# Patient Record
Sex: Female | Born: 1970 | Race: White | Hispanic: Yes | Marital: Married | Smoking: Never smoker
Health system: Southern US, Community
[De-identification: ages and names within clinical notes are randomized; demographics above are authoritative.]

## PROBLEM LIST (undated history)

## (undated) DIAGNOSIS — N915 Oligomenorrhea, unspecified: Secondary | ICD-10-CM

## (undated) DIAGNOSIS — K649 Unspecified hemorrhoids: Secondary | ICD-10-CM

## (undated) DIAGNOSIS — K5792 Diverticulitis of intestine, part unspecified, without perforation or abscess without bleeding: Secondary | ICD-10-CM

## (undated) DIAGNOSIS — Z6791 Unspecified blood type, Rh negative: Secondary | ICD-10-CM

## (undated) DIAGNOSIS — H269 Unspecified cataract: Secondary | ICD-10-CM

## (undated) DIAGNOSIS — I10 Essential (primary) hypertension: Secondary | ICD-10-CM

## (undated) DIAGNOSIS — G43119 Migraine with aura, intractable, without status migrainosus: Principal | ICD-10-CM

## (undated) DIAGNOSIS — F419 Anxiety disorder, unspecified: Secondary | ICD-10-CM

## (undated) DIAGNOSIS — K602 Anal fissure, unspecified: Secondary | ICD-10-CM

## (undated) DIAGNOSIS — E349 Endocrine disorder, unspecified: Secondary | ICD-10-CM

## (undated) HISTORY — DX: Diverticulitis of intestine, part unspecified, without perforation or abscess without bleeding: K57.92

## (undated) HISTORY — DX: Migraine with aura, intractable, without status migrainosus: G43.119

## (undated) HISTORY — DX: Anal fissure, unspecified: K60.2

## (undated) HISTORY — DX: Unspecified cataract: H26.9

## (undated) HISTORY — DX: Endocrine disorder, unspecified: E34.9

## (undated) HISTORY — DX: Anxiety disorder, unspecified: F41.9

## (undated) HISTORY — DX: Unspecified hemorrhoids: K64.9

## (undated) HISTORY — PX: DILATION AND CURETTAGE OF UTERUS: SHX78

## (undated) HISTORY — DX: Essential (primary) hypertension: I10

## (undated) HISTORY — DX: Oligomenorrhea, unspecified: N91.5

## (undated) HISTORY — DX: Unspecified blood type, rh negative: Z67.91

---

## 2006-07-16 ENCOUNTER — Ambulatory Visit (HOSPITAL_COMMUNITY): Admission: RE | Admit: 2006-07-16 | Discharge: 2006-07-16 | Payer: Self-pay | Admitting: *Deleted

## 2006-07-16 ENCOUNTER — Encounter (INDEPENDENT_AMBULATORY_CARE_PROVIDER_SITE_OTHER): Payer: Self-pay | Admitting: *Deleted

## 2006-12-09 ENCOUNTER — Ambulatory Visit (HOSPITAL_COMMUNITY): Admission: RE | Admit: 2006-12-09 | Discharge: 2006-12-09 | Payer: Self-pay | Admitting: *Deleted

## 2007-01-07 ENCOUNTER — Ambulatory Visit (HOSPITAL_BASED_OUTPATIENT_CLINIC_OR_DEPARTMENT_OTHER): Admission: RE | Admit: 2007-01-07 | Discharge: 2007-01-07 | Payer: Self-pay | Admitting: Obstetrics and Gynecology

## 2007-01-07 ENCOUNTER — Encounter: Payer: Self-pay | Admitting: Obstetrics and Gynecology

## 2007-01-07 HISTORY — PX: DILATION AND CURETTAGE OF UTERUS: SHX78

## 2008-07-18 ENCOUNTER — Other Ambulatory Visit: Admission: RE | Admit: 2008-07-18 | Discharge: 2008-07-18 | Payer: Self-pay | Admitting: Gynecology

## 2008-07-18 ENCOUNTER — Ambulatory Visit: Payer: Self-pay | Admitting: Gynecology

## 2008-07-18 ENCOUNTER — Encounter: Payer: Self-pay | Admitting: Gynecology

## 2008-07-31 ENCOUNTER — Ambulatory Visit: Payer: Self-pay | Admitting: Gynecology

## 2008-08-20 ENCOUNTER — Ambulatory Visit: Payer: Self-pay | Admitting: Gynecology

## 2008-09-10 ENCOUNTER — Ambulatory Visit: Payer: Self-pay | Admitting: Gynecology

## 2008-09-18 ENCOUNTER — Ambulatory Visit: Payer: Self-pay | Admitting: Gynecology

## 2008-09-24 ENCOUNTER — Ambulatory Visit: Payer: Self-pay | Admitting: Gynecology

## 2008-10-01 ENCOUNTER — Ambulatory Visit: Payer: Self-pay | Admitting: Gynecology

## 2010-06-22 DIAGNOSIS — E349 Endocrine disorder, unspecified: Secondary | ICD-10-CM

## 2010-06-22 HISTORY — DX: Endocrine disorder, unspecified: E34.9

## 2010-09-09 ENCOUNTER — Encounter (INDEPENDENT_AMBULATORY_CARE_PROVIDER_SITE_OTHER): Payer: Self-pay | Admitting: Gynecology

## 2010-09-09 DIAGNOSIS — N912 Amenorrhea, unspecified: Secondary | ICD-10-CM

## 2010-09-09 DIAGNOSIS — O200 Threatened abortion: Secondary | ICD-10-CM

## 2010-09-11 ENCOUNTER — Other Ambulatory Visit: Payer: Self-pay

## 2010-09-11 ENCOUNTER — Ambulatory Visit (INDEPENDENT_AMBULATORY_CARE_PROVIDER_SITE_OTHER): Payer: Self-pay | Admitting: Gynecology

## 2010-09-11 DIAGNOSIS — O30009 Twin pregnancy, unspecified number of placenta and unspecified number of amniotic sacs, unspecified trimester: Secondary | ICD-10-CM

## 2010-09-11 DIAGNOSIS — IMO0002 Reserved for concepts with insufficient information to code with codable children: Secondary | ICD-10-CM

## 2010-09-29 ENCOUNTER — Ambulatory Visit (INDEPENDENT_AMBULATORY_CARE_PROVIDER_SITE_OTHER): Payer: Self-pay | Admitting: Gynecology

## 2010-09-29 ENCOUNTER — Other Ambulatory Visit: Payer: Self-pay

## 2010-09-29 DIAGNOSIS — N912 Amenorrhea, unspecified: Secondary | ICD-10-CM

## 2010-09-29 DIAGNOSIS — O021 Missed abortion: Secondary | ICD-10-CM

## 2010-09-29 DIAGNOSIS — O9989 Other specified diseases and conditions complicating pregnancy, childbirth and the puerperium: Secondary | ICD-10-CM

## 2010-10-01 ENCOUNTER — Other Ambulatory Visit: Payer: Self-pay | Admitting: Gynecology

## 2010-10-01 ENCOUNTER — Ambulatory Visit (HOSPITAL_BASED_OUTPATIENT_CLINIC_OR_DEPARTMENT_OTHER)
Admission: RE | Admit: 2010-10-01 | Discharge: 2010-10-01 | Disposition: A | Discharge status: Home / Self Care | Payer: Medicaid Other | Source: Ambulatory Visit | Attending: Gynecology | Admitting: Gynecology

## 2010-10-01 DIAGNOSIS — Z01812 Encounter for preprocedural laboratory examination: Secondary | ICD-10-CM | POA: Insufficient documentation

## 2010-10-01 DIAGNOSIS — Z298 Encounter for other specified prophylactic measures: Secondary | ICD-10-CM | POA: Insufficient documentation

## 2010-10-01 DIAGNOSIS — O021 Missed abortion: Secondary | ICD-10-CM | POA: Insufficient documentation

## 2010-10-01 DIAGNOSIS — Z2989 Encounter for other specified prophylactic measures: Secondary | ICD-10-CM | POA: Insufficient documentation

## 2010-10-01 HISTORY — PX: DILATION AND CURETTAGE OF UTERUS: SHX78

## 2010-10-01 LAB — POCT I-STAT 4, (NA,K, GLUC, HGB,HCT)
HCT: 35 % — ABNORMAL LOW (ref 36.0–46.0)
Hemoglobin: 11.9 g/dL — ABNORMAL LOW (ref 12.0–15.0)

## 2010-10-02 LAB — RHOGAM INJECTION
ABO/RH(D): O NEG
Unit division: 0

## 2010-10-03 NOTE — H&P (Signed)
NAME:  Kristie Hill, Kristie Hill       ACCOUNT NO.:  [id number]  MEDICAL RECORD NO.:  [id number]          PATIENT TYPE:  LOCATION:                                 FACILITY:  PHYSICIAN:  Pricilla Moehle H. Lily Peer, M.D.     DATE OF BIRTH:  DATE OF ADMISSION: DATE OF DISCHARGE:                             HISTORY & PHYSICAL   The patient is scheduled for surgery tomorrow, Wednesday, April 11th at 1 p.m. at [facility]  Please have history and physical available.  CHIEF COMPLAINT:  First trimester missed AB.  HISTORY:  The patient is a 40 year old gravida 5, para 0, AB 4 who was seen in the office on April 9th for an ultrasound.  She had previously been seen in the office on March 22nd and has spontaneously conceived on her own.  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  She also had a sonohysterogram at that time.  There was a questionable polyps several years ago, but did not return for followup due to lack of insurance.  She has had a normal karyotype in the past.  Her husband has had children with another partner.  He has not had a karyotype checked. Her last products of conception and miscarriage demonstrated normal chromosome studies.  When she was in the office on March 22nd, there was evidence of twin gestation, which she had had in the past as well, but only 1 gestational sac with no fetal pole or cardiac activity, but the other one did.  She had returned for a followup on April 9th to the office and fetal pole was seen, but no cardiac activity and the previous sac was non-evident.  The Crown-rump was equivalent to 8 weeks and 1 day, the size had lessened.  On last menstrual period, no yolk sac was seen, right ovary was normal, left ovary had a corpus luteum  cyst, cervix was long and closed, she had denied any vaginal bleeding, and she had been placed on progesterone suppositories for luteal support during the first trimester of her pregnancy.  She is now scheduled for a D and E.  Her blood type is O negative.  We will recommend that after her miscarriage that she returns to the office for sonohysterogram and then consider her husband getting a karyotype and possibly considering discussion with a reproductive endocrinologist.  in the next pregnancy, may need to place her on baby aspirin and heparin if the sonohysterogram is negative.  ALLERGIES:  The patient denies any allergies.  PAST MEDICAL HISTORY:  She has had several D and Es in the past.  MEDICATIONS:  Currently, was on progesterone suppository intravaginally and oral prenatal vitamins.  PAST SURGICAL HISTORY:  She denies any other operation, no medical problems.  FAMILY HISTORY:  Negative family history.  SOCIAL HISTORY:  The patient is a housewife.  REVIEW OF SYSTEMS:  Otherwise unremarkable.  PHYSICAL EXAMINATION:  VITAL SIGNS:  The patient weighs 186 pounds, 5 feet 4 inches tall, BMI 32.  Blood pressure 126/76. HEENT:  Unremarkable. NECK:  Supple.  Trachea midline.  No carotid bruits.  No thyromegaly. LUNGS:  Clear to auscultation without rhonchi or wheezes. HEART:  Regular rate and rhythm.  No murmurs or gallop. BREASTS:  Not done. ABDOMEN:  Soft, nontender.  No rebound or guarding. PELVIC:  Bartholin, urethra, Skene within normal limits.  Vagina and cervix, no gross lesions on inspection.  Uterus approximately 8-10 weeks size.  No palpable adnexal masses. RECTAL:  Deferred.  ASSESSMENT:  A 40 year old gravida 5, para 4 at 9 weeks and 5 days by last menstrual period, 8 weeks and 1 day by ultrasound with evidence of early twin gestation and a blighted ovum and now with absence of cardiac activity concurring with a missed AB with an ultrasound dated 8 weeks and 1  day.  The patient is scheduled to undergo D and E at [facility] tomorrow, April 10th at 1 p.m.  The risks, benefits, and pros and cons of the operation were discussed.  Potential risk of infection, she will receive prophylactic antibiotic and the risk for DVT and subsequent pulmonary embolism was discussed.  PSA stockings will be placed.  We also discussed in the event of uncontrolled hemorrhages. She would need blood or blood products as the risk of anaphylactic reaction, hepatitis and AIDS from donor blood and also an open laparotomy, possibly hysterectomy only as life saving measure.  The patient fully understands all the above.  All questions were answered and we will follow accordingly.  Of note, she is blood type O negative, we will need to receive 300 mcg of RhoGAM in the recovery room before being discharged home.  All the above was explained to the patient in Spanish.  All questions were answered and will follow accordingly.  PLAN:  The patient is scheduled for D and E, Wednesday, April 11th at 1 p.m. at [facility].  Please have history and physical available.     Hisao Doo H. Lily Peer, M.D.     JHF/MEDQ  D:  09/30/2010  T:  10/01/2010  Job:  [id number]  Electronically Signed by Reynaldo Minium M.D. on 10/03/2010 09:43:38 AM

## 2010-10-03 NOTE — Op Note (Signed)
  Kristie Hill, Kristie Hill       ACCOUNT NO.:  [id number]  MEDICAL RECORD NO.:  [id number]           PATIENT TYPE:  LOCATION:                                 FACILITY:  PHYSICIAN:  Montrez Marietta H. Lily Peer, M.D.     DATE OF BIRTH:  DATE OF PROCEDURE: DATE OF DISCHARGE:                              OPERATIVE REPORT   LOCATION:  [facility].  PREOPERATIVE DIAGNOSES: 1. First trimester missed AB (twins). 2. Recurrent pregnancy losses.  POSTOPERATIVE DIAGNOSES: 1. First trimester missed AB (twins). 2. Recurrent pregnancy losses.  ANESTHESIA:  MAC and paracervical block with 1% lidocaine with 1:100,000 epinephrine.  FINDINGS:  A 10-12 weeks size uterus.  No palpable adnexal masses.  DESCRIPTION OF OPERATION:  After the patient was adequately counseled, she was taken to the operating room where she underwent MAC anesthesia. She had received a gram of cefotetan for prophylaxis.  She was placed in the high lithotomy position and the vagina and perineum were prepped and draped in usual sterile fashion.  A red rubber Roxan Hockey was inserted to evacuate the bladder of its contents for approximately 50 mL.  Bimanual examination demonstrated 10-12 weeks size uterus.  No palpable adnexal masses.  The patient had received a gram of cefotetan for prophylaxis. A single-tooth tenaculum was placed in the anterior cervical lip.  Pratt dilators were utilized to dilate the cervical canal and epinephrine to allow a 10-mm suction curette which was introduced into the intrauterine cavity after the uterus sounded to 11 cm.  The suction curette removed the products of conception.  This was interchanged with a blunt Hunter's curette to completely evacuate the uterine cavity.  The single-tooth tenaculum was removed.  Silver nitrate was used and she sustained a small bleeding from the puncture from the single-tooth tenaculum and cervical lip.  After ascertaining adequate hemostasis, the  speculum was removed.  The patient was taken to recovery with stable vital signs. She received 30 mg of Toradol en route to the recovery room.  Blood loss was minimal and fluid resuscitation consisted of 1 L of lactated Ringer's.  Of note, the paracervical block that was placed before dilating the cervix consisted of 1% lidocaine with 1:100,000 epinephrine for a total of 10 mL. The patient was transferred to recovery room with stable vital signs. She received Toradol 30 mg IM in the recovery room.  The patient will receive RhoGAM 300 mcg IM due to the fact that she is blood type O negative and had twins from the first trimester.     [name] H. Lily Peer, M.D.     JHF/MEDQ  D:  10/01/2010  T:  10/01/2010  Job:  [id number]  Electronically Signed by Reynaldo Minium M.D. on 10/03/2010 09:43:41 AM

## 2010-10-20 ENCOUNTER — Ambulatory Visit (INDEPENDENT_AMBULATORY_CARE_PROVIDER_SITE_OTHER): Payer: Self-pay | Admitting: Gynecology

## 2010-10-20 DIAGNOSIS — N949 Unspecified condition associated with female genital organs and menstrual cycle: Secondary | ICD-10-CM

## 2010-11-04 NOTE — Op Note (Signed)
Kristie Hill, NORDQUIST       ACCOUNT NO.:  [id number]   MEDICAL RECORD NO.:  [id number]          PATIENT TYPE:  AMB   LOCATION:  [facility]                         FACILITY:  [facility]   PHYSICIAN:  Daniel L. Gottsegen, M.D.DATE OF BIRTH:  11-Apr-1971   DATE OF PROCEDURE:  01/07/2007  DATE OF DISCHARGE:                               OPERATIVE REPORT   PREOPERATIVE DIAGNOSIS:  Missed abortion.   POSTOPERATIVE DIAGNOSIS:  Missed abortion.   OPERATION:  Suction curettage.   SURGEON:  Daniel L. Eda Paschal, M.D.   ANESTHESIA:  General.   INDICATIONS:  The patient is a 40 year old gravida 4, para 0, AB 3 (all  spontaneous), who had presented to our office having not had a period  since April 7.  She had been seen by another obstetrical group in town.  They had done an ultrasound and thought that she had a fetal demise and  she had sought a second opinion.  When she came to our office,  ultrasound was done.  She had twin pregnancies with no evidence of fetal  heart activity.  Size was only 6 weeks 4 days but in the comparing the  two ultrasounds, it was obvious that the patient had had a fetal demise  without any growth from the first ultrasound, which was done 4 weeks  earlier.  She now enters the hospital for suction curettage and  chromosomal studies.   FINDINGS:  External is normal.  BUS is normal.  Vaginal is normal.  Cervix is clean.  Uterus is retroverted, enlarged to about 7-8 weeks'  size.  Adnexa failed to reveal masses.  At the time of D&C, two distinct  amniotic sacs were entered and tissue was consistent with a missed AB.   PROCEDURE:  After adequate general anesthesia, the patient was placed in  the dorsal supine position and prepped and draped in the usual sterile  manner.  A single-tooth tenaculum was placed on the anterior lip of the  cervix.  The cervix was dilated to a #31 Pratt dilator and then a  suction curettage was done with a #10 vacuum curette.  Significant  tissue was obtained, consistent with a fetal demise, and then another  amniotic sac was entered and once more tissue consistent with a fetal  demise was obtained.  All this tissue was put in a container.  Most was  sent to pathology but some was sent for chromosomal analysis.  At the  termination of the procedure there was no bleeding noted.  The uterus  was well-contracted.  Multiple attempts were made to remove any more  tissue and no more was removed.  The patient left the operating room in  satisfactory condition.  Blood loss was minimal.      Reuel Boom L. Eda Paschal, M.D.  Electronically Signed     DLG/MEDQ  D:  01/07/2007  T:  01/08/2007  Job:  [id number]

## 2010-11-07 NOTE — Op Note (Signed)
[name], Kristie Hill       ACCOUNT NO.:  [id number]   MEDICAL RECORD NO.:  [id number]          PATIENT TYPE:  AMB   LOCATION:  [facility]                           FACILITY:  [facility]   PHYSICIAN:  Morgan B. Earlene Plater, M.D.  DATE OF BIRTH:  Oct 07, 1970   DATE OF PROCEDURE:  07/16/2006  DATE OF DISCHARGE:                               OPERATIVE REPORT   PREOPERATIVE DIAGNOSIS:  Eight week missed abortion.   POSTOPERATIVE DIAGNOSIS:  Eight week missed abortion.   PROCEDURE:  Suction curettage with ultrasound guidance.   SURGEON:  Chester Holstein. Earlene Plater, M.D.   ASSISTANT:  None.   ANESTHESIA:  MAC with 20 cc of 1% Nesacaine per cervical block.   SPECIMENS:  Products of conception to pathology.   BLOOD LOSS:  Minimal.   COMPLICATIONS:  None.   INDICATIONS:  Patient with the above history.  Had tried medical  management with Cytotec, which did not result in any bleeding or passage  of tissue; therefore, presents for surgical management.  She is status  post RhoGAM in the office a few days ago when we attempted the Cytotec.  Patient was advised of the risks of surgery, including infection,  bleeding, perforation, retained products, damage to the surrounding  organs.   PROCEDURE:  Patient was taken to the operating room.  MAC anesthesia  obtained.  The patient was placed in the Allen's stirrups, prepped and  draped in a standard fashion.  The bladder was emptied with an in-and-  out cath.  Exam under anesthesia revealed an anteverted, slightly  enlarged uterus.   The speculum inserted.  The paracervical block placed.  The cervix  dilated to a #23 with ultrasound guidance.  The #7 curved cannula was  then inserted under ultrasound guidance, and the uterus evacuated with  suction.  Endometrium gently curetted, and a gritty texture noted.  Ultrasound confirmed complete evacuation.   Instruments were removed.  The cervix was hemostatic.  The patient  tolerated the procedure well with no  complications.  She was taken to  the recovery room in stable condition.      Gerri Spore B. Earlene Plater, M.D.  Electronically Signed     WBD/MEDQ  D:  07/16/2006  T:  07/16/2006  Job:  [id number]

## 2010-12-15 ENCOUNTER — Ambulatory Visit (INDEPENDENT_AMBULATORY_CARE_PROVIDER_SITE_OTHER): Payer: Self-pay | Admitting: Gynecology

## 2010-12-15 DIAGNOSIS — N7013 Chronic salpingitis and oophoritis: Secondary | ICD-10-CM

## 2010-12-15 DIAGNOSIS — N949 Unspecified condition associated with female genital organs and menstrual cycle: Secondary | ICD-10-CM

## 2011-02-04 ENCOUNTER — Encounter: Payer: Self-pay | Admitting: Gynecology

## 2011-02-04 ENCOUNTER — Ambulatory Visit (INDEPENDENT_AMBULATORY_CARE_PROVIDER_SITE_OTHER): Payer: Self-pay | Admitting: Gynecology

## 2011-02-04 VITALS — BP 120/72

## 2011-02-04 DIAGNOSIS — N911 Secondary amenorrhea: Secondary | ICD-10-CM | POA: Insufficient documentation

## 2011-02-04 DIAGNOSIS — N912 Amenorrhea, unspecified: Secondary | ICD-10-CM

## 2011-02-04 DIAGNOSIS — N96 Recurrent pregnancy loss: Secondary | ICD-10-CM | POA: Insufficient documentation

## 2011-02-04 NOTE — Progress Notes (Signed)
Patient is a 40 year old gravida 5 para 0 Ab5 who presented to the office today stating that she has been amenorrheic since April when she had her last miscarriage. Patient has had a history of recurrent pregnancy losses see previous note outlining her past medical history and workup with no etiology having been determined at the present time. She was last seen in the office on June 25 for her postop visit and had still not had a menstrual period we had done an ultrasound on June 25 which demonstrated a uterus that measured 8.3 x 5.4 x 2.6 cm with an endometrial stripe of 2.5 mm she had a 9 x 7 mm solid/cystic mass in the lower uterine segment suggestive of a myoma right ovary was normal and right adnexa was normal the left ovary was normal but located between the left ovary and the left wall of the uterus was a tubular shaped solid cystic mass with positive color flow measuring 38 x 22 x 25 mm. She was to follow up with an ultrasound in 3 months to return to the office today stating that despite taking the Provera for 10 days which he finished in the first week of July she has not menstruated.  Pelvic exam: Bartholin urethra Skene glands: Within normal limits Vagina: No gross lesions on inspection Cervix: No lesions or discharge Uterus: Anteverted normal size shape and consistency with no palpable masses or tenderness Rectal exam: Not done  Assessment: 40 year old gravida 5 para 0 Ab5 patient with recurrent pregnancy losses has been amenorrheic since her last miscarriage in April of 2012 did not withdrawal in July with Provera for 10 days. Workup for secondary amenorrhea has been started by obtaining a TSH, prolactin, and FSH and she'll return back next week to discuss the results. She denies any visual disturbances headaches or nipple discharge. Her urine pregnancy today was negative. She was otherwise asymptomatic with exception of some slight bloating. All the above was discussed with the patient  Spanish and we'll follow accordingly.

## 2011-02-11 ENCOUNTER — Encounter: Payer: Self-pay | Admitting: Gynecology

## 2011-02-11 ENCOUNTER — Other Ambulatory Visit: Payer: Self-pay

## 2011-02-11 ENCOUNTER — Ambulatory Visit (INDEPENDENT_AMBULATORY_CARE_PROVIDER_SITE_OTHER): Payer: Self-pay | Admitting: Gynecology

## 2011-02-11 VITALS — BP 116/70

## 2011-02-11 DIAGNOSIS — E229 Hyperfunction of pituitary gland, unspecified: Secondary | ICD-10-CM

## 2011-02-11 DIAGNOSIS — E230 Hypopituitarism: Secondary | ICD-10-CM | POA: Insufficient documentation

## 2011-02-11 MED ORDER — BROMOCRIPTINE MESYLATE 2.5 MG PO TABS: 2.5000 mg | ORAL_TABLET | Freq: Two times a day (BID) | ORAL | Status: DC

## 2011-02-11 NOTE — Progress Notes (Signed)
Patient is a 40 year old gravida 5 para 0 Ab5 with history of recurrent pregnancy losses. Patient had a D. and E. for a missed abortion on April 12 of this year. Chorionic villi was identified. Patient has had extensive workup for recurrent pregnancy loss in the past no etiology has been determined. She had stated that she had been amenorrheic since April 11, she was seen in the office on 12/15/2010 she had an ultrasound  demonstrated a normal uterus tubes and ovaries an apparent tubular shaped structure between the left uterine wall and the ovary suspicious for hydrosalpinx with  positive color flow was noted measuring 38 x 22 x 25 mm. Patient had been given Provera 10 mg to take daily for 10 days to initiate withdrawal bleed and did not. She was seen in the office last week an additional blood work was ordered such as a TSH of prolactin and FSH.  Patient's TSH and FSH were normal but prolactin level was elevated to 99. Patient denies any visual disturbances, no galactorrhea, no unusual headaches. 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 We will try to make arrangements within the next week although she has no insurance she is in the process of getting. We will try to work out here in the community if there is any available resources to help her have the study. We will start her on Parlodel 2.5 mg one by mouth daily for the first week and increased to 2.5 mg twice a day until further notice. She will return to the office in 6-8 weeks for her prolactin level. She will use barrier contraception for the next 3-4 months before she attempts to conceive again. Literature information was provided on hyperprolactinemia instructions were provided in Spanish all questions were answered and we'll follow accordingly. She will have a followup ultrasound in 3 months as well to followup on this tubular  structure.

## 2011-02-11 NOTE — Patient Instructions (Signed)
Patient information: Prolactinoma (The Basics)   What are prolactinomas? - Prolactinomas are abnormal growths that form right below the brain, in an organ called the "pituitary gland"These growths can cause symptoms, such as absent monthly periods in women or low sex drive in men. The pituitary gland has different cells in it that make different hormones. Some of the cells in the pituitary gland make a hormone called "prolactin." A prolactinoma forms when these cells change into abnormal cells and grow out of control. When that happens, the body's prolactin levels can get too high. Prolactinomas can cause symptoms in 2 ways. Symptoms can happen when:  A person has too much prolactin - This can throw off the levels of other hormones.  A prolactinoma grows and presses on nearby tissues in the head, such as the nerves that go from the eyes to the brain Prolactinomas can grow in both men and women. They are most common in women younger than 39 years old. What are the symptoms of a prolactinoma? - Too much prolactin can cause symptoms in men and in women who still get their monthly periods. (Women who no longer get their monthly periods do not get these symptoms.) Symptoms can include: Absent or irregular monthly periods (in women)  Trouble starting a pregnancy (in men and women)  Milk leaking from the breasts (in women)  Vaginal dryness and hot flashes (in women)  Low energy, low sex drive, and trouble getting an erection (in men)  Increase in breast size and breast soreness (in men)  Bone loss (in men and women) - This takes a few years to happen. When a prolactinoma presses on nearby tissues, it can cause: Vision problems  Headaches  Lower levels of other hormones that are made in the pituitary gland Is there a test for a prolactinoma? - Yes. Your doctor or nurse can do: A blood test to measure the level of prolactin in your body  An imaging test called an MRI - Imaging tests create pictures of  the inside of the body. How are prolactinomas treated? - Prolactinomas are treated in different ways, depending on their size, the symptoms they cause, and other factors. Small prolactinomas that cause no symptoms do not usually need to be treated. Big prolactinomas that cause symptoms do need to be treated. Prolactinomas that need treatment are usually first treated with medicines called "dopamine agonists." These medicines can shrink prolactinomas and lower prolactin levels to normal. When the prolactin levels get back to normal, most symptoms get better. People usually take these medicines for years. If these medicines do not work or cause too many side effects, there are other treatment options. These can include: Surgery to remove the prolactinoma - Sometimes, doctors recommend surgery first. For example, women who have a big prolactinoma and want to get pregnant might be treated with surgery first.  Other types of medicines What if I want to get pregnant? - If you want to get pregnant, talk with your doctor about the right treatment for you. Many women are able to get pregnant after treatment.  Women who take dopamine agonist medicines must stop taking them during pregnancy. But without these medicines, their prolactinomas can start to grow.  If you stop your medicines, tell your doctor or nurse about any vision changes or new or worse headaches. Having those symptoms could mean that your prolactinoma has grown. If that happens, your doctor will talk with you about possible treatments. What if I go through menopause? - If  you are being treated for a prolactinoma and go through menopause, talk with your doctor or nurse. Some women can stop their treatment after menopause.

## 2011-04-06 LAB — RHOGAM INJECTION

## 2011-05-07 ENCOUNTER — Ambulatory Visit (INDEPENDENT_AMBULATORY_CARE_PROVIDER_SITE_OTHER): Payer: Managed Care, Other (non HMO) | Admitting: Gynecology

## 2011-05-07 ENCOUNTER — Encounter: Payer: Self-pay | Admitting: Gynecology

## 2011-05-07 VITALS — BP 128/86

## 2011-05-07 DIAGNOSIS — R51 Headache: Secondary | ICD-10-CM

## 2011-05-07 DIAGNOSIS — E229 Hyperfunction of pituitary gland, unspecified: Secondary | ICD-10-CM

## 2011-05-07 DIAGNOSIS — R519 Headache, unspecified: Secondary | ICD-10-CM

## 2011-05-07 DIAGNOSIS — N912 Amenorrhea, unspecified: Secondary | ICD-10-CM

## 2011-05-07 DIAGNOSIS — E221 Hyperprolactinemia: Secondary | ICD-10-CM

## 2011-05-07 MED ORDER — SUMATRIPTAN-NAPROXEN SODIUM 85-500 MG PO TABS: 1.0000 | ORAL_TABLET | ORAL | Status: DC | PRN

## 2011-05-07 NOTE — Progress Notes (Signed)
Patient is a 40 year old gravida 5 para 0 AB 5 with history of recurrent pregnancy losses. See previous note outlining her workup which has turned out to be negative time to determine etiology. Patient has had oligomenorrhea and recently was worked up and found her prolactin was elevated at 99 in August of this year. She denied any visual disturbances ringing galactorrhea are unusual headaches until recently she's been complaining of frequent mostly temporal headaches. She was started on bromocriptine 2.5 mg by mouth twice a day and were in the process of making arrangements for her to have an MRI to rule out pituitary macroadenoma. Patient has not been able to do so because of financial reasons and now she has insurance and would like to proceed and have it scheduled.  Exam: Patient was normotensive today HEENT: Unremarkable Lungs: Clear to auscultation Rogers or wheezes Heart: Regular rate and rhythm no murmurs or gallops  Patient in no distress today. We'll proceed with scheduling her MRI and wait for the results and plan a course of management. If her MRI indicates any abnormality will refer to the neurosurgeon. If her MRI is normal we'll also wait for the result of her prolactin level that was repeated today to see if she has been responding to therapy. And although she's been using barrier contraception, since she's been amenorrheic for 2 months we'll check her urine pregnancy test today. As to her headache will also check a hemoglobin A1c since she is fasting today because at times she states that she doesn't long periods of time without bleeding and this may be triggering her headaches as a result of hypoglycemic events. She will call me 2 days after her MRI to discuss a result of her prolactin and plan a course of management. All the above was discussed Spanish and we'll follow accordingly.

## 2011-05-07 NOTE — Progress Notes (Signed)
Addended by: Landis Martins R on: 05/07/2011 12:59 PM   Modules accepted: Orders

## 2011-05-08 ENCOUNTER — Emergency Department (HOSPITAL_COMMUNITY)
Admission: EM | Admit: 2011-05-08 | Discharge: 2011-05-08 | Disposition: A | Discharge status: Home / Self Care | Payer: Managed Care, Other (non HMO) | Attending: Emergency Medicine | Admitting: Emergency Medicine

## 2011-05-08 ENCOUNTER — Encounter (HOSPITAL_COMMUNITY): Payer: Self-pay | Admitting: Adult Health

## 2011-05-08 ENCOUNTER — Emergency Department (HOSPITAL_COMMUNITY): Payer: Managed Care, Other (non HMO)

## 2011-05-08 DIAGNOSIS — G43909 Migraine, unspecified, not intractable, without status migrainosus: Secondary | ICD-10-CM | POA: Insufficient documentation

## 2011-05-08 MED ORDER — METOCLOPRAMIDE HCL 5 MG/ML IJ SOLN
10.0000 mg | Freq: Once | INTRAMUSCULAR | Status: AC
  Administered 2011-05-08: 10 mg via INTRAVENOUS
  Filled 2011-05-08: qty 2

## 2011-05-08 MED ORDER — DIPHENHYDRAMINE HCL 50 MG/ML IJ SOLN
25.0000 mg | Freq: Once | INTRAMUSCULAR | Status: AC
  Administered 2011-05-08: 25 mg via INTRAVENOUS
  Filled 2011-05-08: qty 1

## 2011-05-08 MED ORDER — DEXAMETHASONE SODIUM PHOSPHATE 10 MG/ML IJ SOLN
10.0000 mg | Freq: Once | INTRAMUSCULAR | Status: AC
  Administered 2011-05-08: 10 mg via INTRAVENOUS
  Filled 2011-05-08: qty 1

## 2011-05-08 NOTE — ED Notes (Signed)
C/o of headache for 3 days went to [facility] and received pain shot. Pain is prodiminately on left side and pt c/o ear pain, denies blurred vision, c/o slight nausea. Pt is spanish speaking only.

## 2011-05-08 NOTE — ED Notes (Signed)
Pt given discharge instructions, stated understanding, feels much better now, amb indep with steady gait and spouse to discharge window.

## 2011-05-08 NOTE — ED Provider Notes (Signed)
History     CSN: [id number] Arrival date & time: 05/08/2011  2:15 PM   First MD Initiated Contact with Patient 05/08/11 1623      Chief Complaint  Patient presents with  . Headache    (Consider location/radiation/quality/duration/timing/severity/associated sxs/prior treatment) HPI Comments: Had a shot at urgent care 2 days ago and still has HA.  Patient is a 40 y.o. female presenting with headaches. The history is provided by the patient. The history is limited by a language barrier.  Headache  This is a recurrent problem. The current episode started 2 days ago. The problem occurs constantly. The problem has been gradually worsening. The headache is associated with nothing. The pain is located in the left unilateral region. The quality of the pain is described as sharp and throbbing. The pain is at a severity of 9/10. The pain is moderate. The pain does not radiate. Pertinent negatives include no fever, no shortness of breath, no nausea and no vomiting. She has tried nothing for the symptoms. The treatment provided no relief.    Past Medical History  Diagnosis Date  . Recurrent pregnancy loss   . Oligomenorrhea   . Blood type, Rh negative     O negative    Past Surgical History  Procedure Date  . Dilation and curettage of uterus 01/07/07  . Dilation and curettage of uterus 10/01/10    D&E  . Dilation and curettage of uterus     3 other times    Family History  Problem Relation Age of Onset  . Heart disease Father     History  Substance Use Topics  . Smoking status: Never Smoker   . Smokeless tobacco: Never Used  . Alcohol Use: No    OB History    Grav Para Term Preterm Abortions TAB SAB Ect Mult Living   5    5  5    0      Review of Systems  Constitutional: Negative for fever.  Respiratory: Negative for shortness of breath.   Gastrointestinal: Negative for nausea and vomiting.  Neurological: Positive for headaches.  All other systems reviewed and are  negative.    Allergies  Review of patient's allergies indicates no known allergies.  Home Medications   Current Outpatient Rx  Name Route Sig Dispense Refill  . BROMOCRIPTINE MESYLATE 2.5 MG PO TABS Oral Take 1 tablet (2.5 mg total) by mouth 2 (two) times daily. 60 tablet 11  . IBUPROFEN 200 MG PO CAPS Oral Take 2 capsules by mouth every 8 (eight) hours as needed. For pain.      BP 127/87  Pulse 95  Resp 18  SpO2 100%  LMP 03/07/2011  Physical Exam  Nursing note and vitals reviewed. Constitutional: She is oriented to person, place, and time. She appears well-developed and well-nourished. No distress.  HENT:  Head: Normocephalic and atraumatic.  Eyes: EOM are normal. Pupils are equal, round, and reactive to light.  Fundoscopic exam:      The right eye shows no papilledema.       The left eye shows no papilledema.  Neck: Normal range of motion. Neck supple.  Cardiovascular: Normal rate, regular rhythm, normal heart sounds and intact distal pulses.  Exam reveals no friction rub.   No murmur heard. Pulmonary/Chest: Effort normal and breath sounds normal. She has no wheezes. She has no rales.  Abdominal: Soft. Bowel sounds are normal. She exhibits no distension. There is no tenderness. There is no rebound and no guarding.  Musculoskeletal: Normal range of motion. She exhibits no tenderness.       No edema  Lymphadenopathy:    She has no cervical adenopathy.  Neurological: She is alert and oriented to person, place, and time. She has normal strength. No cranial nerve deficit or sensory deficit. Gait normal.  Skin: Skin is warm and dry. No rash noted.  Psychiatric: She has a normal mood and affect. Her behavior is normal.    ED Course  Procedures (including critical care time)  Labs Reviewed - No data to display No results found.   No diagnosis found.    MDM   Pt with typical migraine HA without sx suggestive of SAH(sudden onset, worst of life, or deficits),  infection, or cavernous vein thrombosis.  Normal neuro exam and vital signs. Will give HA cocktail and on re-eval.  Pt states she gets a lot of HA and had a shot at urgent care on wed with only minimal pain.   5:21 PM CT neg and on re-eval pt feeling better and will d/c home.      Gwyneth Sprout, MD 05/08/11 [phone]

## 2011-05-12 ENCOUNTER — Telehealth: Payer: Self-pay | Admitting: *Deleted

## 2011-05-12 NOTE — Telephone Encounter (Signed)
Patient was called by Debarah Crape from the front desk to speak spanish to the patient.  Patient told Debarah Crape that she had gone to the ER and had a CT of her head done and was neg. Per JF we will not schedule MRI and follow up after meds completed.

## 2011-06-18 ENCOUNTER — Other Ambulatory Visit: Payer: Self-pay | Admitting: Gynecology

## 2011-06-19 ENCOUNTER — Ambulatory Visit (INDEPENDENT_AMBULATORY_CARE_PROVIDER_SITE_OTHER): Payer: Managed Care, Other (non HMO) | Admitting: Gynecology

## 2011-06-19 ENCOUNTER — Encounter: Payer: Self-pay | Admitting: Gynecology

## 2011-06-19 VITALS — BP 120/72

## 2011-06-19 DIAGNOSIS — N644 Mastodynia: Secondary | ICD-10-CM

## 2011-06-19 NOTE — Progress Notes (Signed)
Patient presents with a three-day history of bilateral breast tenderness. No galactorrhea no palpable abnormalities but just tenderness in the lower portion of both breasts.  Patient is on Parlodel for hyperprolactinemia and is doing well with this. Her last prolactin level was 7 in November 2012.  Exam with Elane Fritz chaperone present Breasts examined lying and sitting: Without masses retractions discharge adenopathy.  Assessment and plan: Mastodynia several day history, midcycle. Recommended observation with Motrin for discomfort. Assuming discomfort resolves patient will follow. If any palpable abnormalities or discomfort persists the patient knows to call for a more involved evaluation. I did review screening mammogram recommendations 0.35 and 40 I recommend she get a screening mammogram this coming year she agrees to arrange this.

## 2011-06-19 NOTE — Patient Instructions (Signed)
Take Motrin for the breast pain. If it continues over the next one to 2 weeks call the office and we'll pursue a more involved evaluation. If it totally resolves and I would follow with self breast exams on a monthly basis and I would schedule a baseline screening mammogram sometime this coming year.

## 2011-11-02 ENCOUNTER — Ambulatory Visit (INDEPENDENT_AMBULATORY_CARE_PROVIDER_SITE_OTHER): Payer: Managed Care, Other (non HMO) | Admitting: Gynecology

## 2011-11-02 ENCOUNTER — Other Ambulatory Visit (HOSPITAL_COMMUNITY)
Admission: RE | Admit: 2011-11-02 | Discharge: 2011-11-02 | Disposition: A | Discharge status: Home / Self Care | Payer: Managed Care, Other (non HMO) | Source: Ambulatory Visit | Attending: Gynecology | Admitting: Gynecology

## 2011-11-02 ENCOUNTER — Encounter: Payer: Self-pay | Admitting: Gynecology

## 2011-11-02 VITALS — BP 124/80 | Ht 64.25 in | Wt 197.0 lb

## 2011-11-02 DIAGNOSIS — Z8639 Personal history of other endocrine, nutritional and metabolic disease: Secondary | ICD-10-CM

## 2011-11-02 DIAGNOSIS — R102 Pelvic and perineal pain unspecified side: Secondary | ICD-10-CM

## 2011-11-02 DIAGNOSIS — N949 Unspecified condition associated with female genital organs and menstrual cycle: Secondary | ICD-10-CM

## 2011-11-02 DIAGNOSIS — R635 Abnormal weight gain: Secondary | ICD-10-CM

## 2011-11-02 DIAGNOSIS — Z01419 Encounter for gynecological examination (general) (routine) without abnormal findings: Secondary | ICD-10-CM

## 2011-11-02 DIAGNOSIS — N96 Recurrent pregnancy loss: Secondary | ICD-10-CM

## 2011-11-02 NOTE — Progress Notes (Signed)
Kristie Hill April 15, 1971 [id number]   History:    41 y.o.  for annual exam with complaint of right lower quadrant discomfort on and off for the past week. Patient is only using withdrawal as a form of contraception. Patient with history of recurrent pregnancy losses with extensive evaluation no etiology determine. Patient diagnosed last year with hyperprolactinemia currently on Parlodel 2.5 mg twice a day. 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  Past medical history,surgical history, family history and social history were all reviewed and documented in the EPIC chart.  Gynecologic History Patient's last menstrual period was 10/28/2011. Contraception: none Last Pap: 2010. Results were: Normal Last mammogram: No prior study. Results were: No prior study  Obstetric History OB History    Grav Para Term Preterm Abortions TAB SAB Ect Mult Living   5    5  5    0     # Outc Date GA Lbr Len/2nd Wgt Sex Del Anes PTL Lv   1 SAB            2 SAB            3 SAB            4 SAB            5 SAB                ROS:  Was performed and pertinent positives and negatives are included in the history.  Exam: chaperone present  BP 124/80  Ht 5' 4.25" (1.632 m)  Wt 197 lb (89.359 kg)  BMI 33.55 kg/m2  LMP 10/28/2011  Body mass index is 33.55 kg/(m^2).  General appearance : Well developed well nourished female. No acute distress HEENT: Neck supple, trachea midline, no carotid bruits, no thyroidmegaly Lungs: Clear to auscultation, no rhonchi or wheezes, or rib retractions  Heart: Regular rate and rhythm, no murmurs or gallops Breast:Examined in sitting and supine position were symmetrical in appearance, no palpable masses or  tenderness,  no skin retraction, no nipple inversion, no nipple discharge, no skin discoloration, no axillary or supraclavicular lymphadenopathy Abdomen: no palpable masses or tenderness, no rebound or guarding Extremities: no edema or skin discoloration or tenderness  Pelvic:  Bartholin, Urethra, Skene Glands: Within normal limits             Vagina: No gross lesions or discharge  Cervix: No gross lesions or discharge  Uterus  anteverted, normal size, shape and consistency, non-tender and mobile  Adnexa  Without masses or tenderness  Anus and perineum  normal   Rectovaginal  normal sphincter tone without palpated masses or tenderness             Hemoccult not done     Assessment/Plan:  41 y.o. female for annual exam will return later this week for sonohysterogram to better assess her intrauterine cavity and she's had history in the past of recurrent pregnancy losses. We'll also reassess the previously seen left adnexal questionable tubular shaped mass as well as assess her right adnexa were she's been having pain for the past week. She states her cycles of been normal otherwise. Patient with past history of hyperprolactinemia diagnosed last year. Since she has  been on Parlodel 2.5 mg twice a day her cycles have now been regular. We'll check her prolactin level today along with a CBC, cholesterol, TSH, random blood sugar and urinalysis as well as Pap smear. Requisition was provided her to schedule her mammogram as well. She was reminded to do her monthly self breast examination. We discussed importance of regular exercise and appropriate nutrition. All the above was explained in Spanish in detail questions rancher will follow accordingly.   Ok Edwards MD, 5:04 PM 11/02/2011

## 2011-11-02 NOTE — Patient Instructions (Signed)
Ecografa transvaginal (Transvaginal Ultrasound) La ecografa transvaginal es una ecografa plvica en la que se utiliza una probeta metlica que se coloca en la vagina, para observar los rganos femeninos. El ecgrafo enva ondas sonoras desde un transductor (sonda). Estas ondas sonoras chocan contra las estructuras del cuerpo (como un eco) y crean una imagen. La imagen se observa en un monitor. Se denomina transvaginal debido a que la sonda se inserta dentro de la vagina. Puede haber una pequea molestia por la introduccin de la sonda. Esta prueba tambin puede realizarse durante el embarazo. La ecografa endovaginal es otro nombre para la ecografa transvaginal. En una ecografa transabdominal, la sonda se coloca en la parte externa del abdomen. Este mtodo no ofrece imgenes tan buenas como la tcnica transvaginal. La ecogafa transvaginal se utiliza para observar alteraciones en el tracto genital femenino. Entre ellos se incluyen:  Problemas de infertilidad.   Malformaciones congnitas (defecto de nacimiento) del tero y los ovarios.   Tumores en el tero.   Hemorragias anormales.   Tumores y quistes de ovario.   Abscesos (tejidos inflamados y pus) en la pelvis.   Dolor abdominal o plvico sin causa aparente.   Infecciones plvicas.  DURANTE EL EMBARAZO, SE UTILIZA PAR OBSERVAR:  Embarazos normales.   Un embarazo ectpico (embarazo fuera del tero).   Latidos cardacos fetales.   Anormalidades de la pelvis que no se observan bien con la ecografa transabdominal.   Sospecha de gemelos o embarazo mltiple.   Aborto inminente.   Problemas en el cuello del tero (cuello incompetente, no permanece cerrado para contener al beb).   Cuando se realiza una amniocentesis (se retira lquido de la bolsa amnitica, para ser evaluado).   Al buscar anormalidades en el beb.   Para controlar el crecimiento, el desarrollo y la edad del feto.   Para medir la cantidad de lquido en el  saco amnitico.   Cuando se realiza una versin externa del beb (se lo mueve a una posicin correcta).   Evaluar al beb en embarazos de alto riesgo (perfil biofsico).   Si se sospecha el deceso del beb (muerte).  En algunos casos, se utiliza un mtodo especial denominado ecografa con infusin salina, para una observacin ms precisa del tero. Se inyecta solucin salina (agua con sal) dentro del tero en pacientes no embarazadas para observar mejor su interior. Este mtodo no se utiliza en mujeres embarazadas. La probeta tambin puede usarse para obtener biopsias de reas anormales, para drenar lquido de quistes de ovario y para hallar un DIU (dispositivo intrauterino para el control de la natalidad) que no pueda ubicarse. PREPARACIN PARA LA PRUEBA La ecografa transvaginal se realiza con la vejiga vaca. La ecografa transabdominal se realiza con la vejiga llena. Podrn solicitarle que beba varios vasos de agua antes del examen. En algunos casos se realiza una ecografa transabdominal antes de la ecografa transvaginal para obervar los rganos del abdomen. PROCEDIMIENTO  Deber acostarse en una cama, con las rodillas dobladas y los pies en los estribos. La probeta se cubre con un condn. Dentro de la vagina y en la probeta se aplica un lubricante estril. El lubricante ayuda a transmitir las ondas sonoras y evita la irritacin de la vagina. El mdico mover la sonda en el interior de la cavidad vaginal para escanear las estructuras plvicas. Un examen normal mostrar una pelvis normal y contenidos normales en su interior. Una prueba anormal mostrar anormalidades en la pelvis, la placenta o el beb. LAS CAUSAS DE UN RESULTADO ANORMAL PUEDEN SER:    Crecimientos o tumores en:   El tero.   Los ovarios.   La vagina.   Otras estructuras plvicas.   Crecimientos no cancerosos en el tero y los ovarios.   El ovario se retuerce y se corta el suministro de sangre (torsin ovrica).   Las  reas de infeccin incluyen:   Enfermedad inflamatoria plvica.   Un absceso en la pelvis.   Ubicacin de un DIU.  LOS PROBLEMAS QUE PUEDEN HALLARSE EN UNA MUJER EMBARAZADA SON:  Embarazo ectpico (embarazo fuera del tero).   Embarazos mltiples.   Dilatacin (apertura) precoz anormal del cuello del tero. Esto puede indicar un cuello incompetente y un parto prematuro.   Aborto inminente.   Muerte fetal.   Los problemas con la placenta incluyen:   La placenta se ha desarrollado sobre la abertura del cuello del tero (placenta previa).   La placenta se ha separado anticipadamente en el tero (abrupcin placentaria).   La placenta se desarrolla en el msculo del tero (placenta acreta).   Tumores del embarazo, incluyendo la enfermedad trofoblstica gestacional. Se trata de un embarazo anormal en el que no hay feto. El tero se llena de quistes similares a uvas que en algunos casos son cancerosos.   Posicin incorrecta del feto (de nalgas, de vrtice).   Retraso del desarrollo fetal intrauterino (escaso desarrollo en el tero).   Anormalidades o infeccin fetal.  RIESGOS Y COMPLICACIONES No hay riesgos conocidos para la ecografa. No se toman radiografas cuando se realiza una ecografa. Document Released: 09/24/2008 Document Revised: 05/28/2011 [employer] Patient Information 2012 [employer]. 

## 2011-11-03 LAB — CBC WITH DIFFERENTIAL/PLATELET
Basophils Absolute: 0 10*3/uL (ref 0.0–0.1)
Eosinophils Relative: 1 % (ref 0–5)
Hemoglobin: 12.3 g/dL (ref 12.0–15.0)
Lymphocytes Relative: 29 % (ref 12–46)
Lymphs Abs: 2.4 10*3/uL (ref 0.7–4.0)
MCH: 30.6 pg (ref 26.0–34.0)
MCHC: 31.1 g/dL (ref 30.0–36.0)
Neutrophils Relative %: 62 % (ref 43–77)
Platelets: 279 10*3/uL (ref 150–400)
RBC: 4.02 MIL/uL (ref 3.87–5.11)

## 2011-11-03 LAB — GLUCOSE, RANDOM: Glucose, Bld: 97 mg/dL (ref 70–99)

## 2011-11-04 ENCOUNTER — Other Ambulatory Visit: Payer: Self-pay | Admitting: Gynecology

## 2011-11-04 ENCOUNTER — Ambulatory Visit (INDEPENDENT_AMBULATORY_CARE_PROVIDER_SITE_OTHER): Payer: Managed Care, Other (non HMO)

## 2011-11-04 ENCOUNTER — Ambulatory Visit (INDEPENDENT_AMBULATORY_CARE_PROVIDER_SITE_OTHER): Payer: Managed Care, Other (non HMO) | Admitting: Gynecology

## 2011-11-04 ENCOUNTER — Ambulatory Visit: Payer: Managed Care, Other (non HMO) | Admitting: Gynecology

## 2011-11-04 ENCOUNTER — Other Ambulatory Visit: Payer: Managed Care, Other (non HMO)

## 2011-11-04 DIAGNOSIS — N949 Unspecified condition associated with female genital organs and menstrual cycle: Secondary | ICD-10-CM

## 2011-11-04 DIAGNOSIS — R102 Pelvic and perineal pain unspecified side: Secondary | ICD-10-CM

## 2011-11-04 DIAGNOSIS — Q51810 Arcuate uterus: Secondary | ICD-10-CM

## 2011-11-04 DIAGNOSIS — Z8759 Personal history of other complications of pregnancy, childbirth and the puerperium: Secondary | ICD-10-CM

## 2011-11-04 DIAGNOSIS — R1900 Intra-abdominal and pelvic swelling, mass and lump, unspecified site: Secondary | ICD-10-CM

## 2011-11-04 DIAGNOSIS — D3910 Neoplasm of uncertain behavior of unspecified ovary: Secondary | ICD-10-CM

## 2011-11-04 DIAGNOSIS — Z113 Encounter for screening for infections with a predominantly sexual mode of transmission: Secondary | ICD-10-CM

## 2011-11-04 DIAGNOSIS — N7013 Chronic salpingitis and oophoritis: Secondary | ICD-10-CM

## 2011-11-04 DIAGNOSIS — N7011 Chronic salpingitis: Secondary | ICD-10-CM

## 2011-11-04 LAB — URINALYSIS W MICROSCOPIC + REFLEX CULTURE
Bacteria, UA: NONE SEEN
Bilirubin Urine: NEGATIVE
Hgb urine dipstick: NEGATIVE
Ketones, ur: NEGATIVE mg/dL
Protein, ur: NEGATIVE mg/dL

## 2011-11-04 MED ORDER — DOXYCYCLINE HYCLATE 50 MG PO CAPS: 100.0000 mg | ORAL_CAPSULE | Freq: Two times a day (BID) | ORAL | Status: AC

## 2011-11-04 NOTE — Patient Instructions (Addendum)
Anastazja, deja de tomar el Parlodel dos veces al dia. Tomate una diaria nada mas. Te llame a la farmacia el antibiotico que quiero que tomes [employer] veces al dai poe una semana. [place] contraceptivo durante el tiempo que estas tomando el antibiotico. Si no te Firefighter o tienes problema con el periodo te tengo que ver antes el ano que viene. Sique tomando una vitamina diaria.  Enfermedad Inflamatoria Plvica (Pelvic Inflammatory Disease) La EIP es una infeccin de algunos o todos los rganos reproductores femeninos. Esto incluye la matriz (tero), ovarios, trompas y tejidos de la pelvis. La EIP es una causa comn de la aparicin repentina (aguda) de un dolor abdominal (plvico). La EPI puede tratarse, pero es una infeccin grave. Pueden pasar semanas hasta estar completamente curado. En algunos casos, es necesaria la hospitalizacin para Printmaker ciruga o Building services engineer medicamentos que [employer] grmenes (antibiticos). CAUSAS  Esta enfermedad es causada por microorganismos que se transmiten durante el contacto sexual.   La EIP tambin puede ocurrir luego de:   Nacimiento del beb.   Aborto espontneo.   Aborto provocado.   Ciruga mayor de la pelvis.   Uso del DIU.   Agresin sexual.  SNTOMAS  Dolor abdominal o plvico.   Grant Ruts.   Escalofros.   Flujo vaginal anormal.  DIAGNSTICO El mdico elegir alguno de estos mtodos para Education officer, environmental un diagnstico:  Exmenes fsicos e historia clnica.   Anlisis de [place].   Cultivos del cuello del tero y la vagina.   Radiografas o ultrasonido.   Procedimiento para mirar dentro de la pelvis (laparoscopa).  TRATAMIENTO  Uso de antibiticos por va oral o intravenosa.   Tratamiento de compaeros sexuales cuando la infeccin es una enfermedad de transmisin sexual (ETS).   Podr necesitar ciruga y hospitalizacin.  RIESGOS Y COMPLICACIONES  La EPI puede producir incapacidad para tener hijos (esterilidad) en las  mujeres si no es tratada, o si el tratamiento es incompleto. Por eso es importante que tome todos los medicamentos que le prescribieron, hasta finalizarlos.   En algunas mujeres que han recibido el tratamiento completo tambin puede producirse esterilidad o Insurance underwriter en las trompas (ectpicos). Por eso es tan importante seguir el tratamiento prescripto.   Las infecciones frecuentes pueden producir dolor plvico de larga duracin (crnico).   Relaciones sexuales dolorosas.   Absesos plvicos.   En algunos casos raros, se practicar una ciruga o una histerectoma.   Si esta es una infeccin transmitida a travs del contacto sexual, usted tiene Nurse, adult de Marine scientist otras enfermedades de [place] tipo, inclusive el [place] o VIH.  INSTRUCCIONES PARA EL CUIDADO DOMICILIARIO  Tome todos los medicamentos tal como se le indic. Un tratamiento incompleto la pondr en riesgo de esterilidad y de embarazos ectpicos.   Utilice los medicamentos de venta libre o de prescripcin para Chief Technology Officer, Environmental health practitioner o la [place], segn se lo indique el profesional que lo asiste.   No tenga relaciones sexuales hasta completar el tratamiento, o segn las indicaciones del mdico. Si se confirma la enfermedad inflamatoria plvica, sus compaeros sexuales recientes necesitarn tratamiento.   Cumpla con las citas tal como se le indic.  SOLICITE ATENCIN MDICA SI:  Brett Fairy hemorragia vaginal anormal o abundante.   Necesitar que le prescriban medicamentos para Chief Technology Officer.   Su pareja tiene una ETS.   Tiene vmitos.   No puede tomar los medicamentos.  SOLICITE ATENCIN MDICA DE INMEDIATO SI:  Tiene fiebre.   Siente que Therapist, art.  Presenta escalofros.   Siente dolor al [employer].   No mejora luego de 72 horas del tratamiento.  Document Released: 03/18/2005 Document Revised: 05/28/2011 [facility] Patient Information 2012 [place], [place].

## 2011-11-04 NOTE — Progress Notes (Signed)
Patient presented to the office today for followup schedule sonohysterogram. Patient with past history of recurrent pregnancy losses see previous note. Patient during time of her annual exam on a this year was noted to be complaining of on and off right lower quadrant discomfort for the previous week. She is using condoms for contraception. She does have a history of hyperprolactinemia for which she takes bromocriptine 2.5 mg twice a day. She states that now her cycles are regular. 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 She's here to assess this area as well. She denies any vaginal discharge. Her recent lab work consisting of a CBC, cholesterol, TSH, random blood sugar and urinalysis were normal.  Ultrasound for/sonohysterogram: Uterus measured 8.9 x 4.9 x 3.0 cm with an endometrial stripe of 6.4 mm. Anteverted uterus with arcuate configuration. Right ovary with echo-free cyst measuring 25 x 23 mm (follicle) also tubular cystic serpentine shaped mass noted consistent with a hydrosalpinx measuring 29 x 16 x 30 mm. Negative color flow. Left ovary was normal. Previous cystic solid mass on the left side not seen. Sonohysterogram with no intracavitary defect other than the arcuate configuration of the uterus.  Assessment/plan: Patient with past history of recurrent pregnancy losses with extensive evaluation see previous notes. Patient currently not attempting to get pregnant is using condoms for contraception. Due to findings on ultrasound of the hydrosalpinx on the right side and her recent discomfort on and off for the past week and a half we'll treat her as a subacute PID and place her on Vibramycin 100 mg twice a day for 7 days. A GC and Chlamydia  culture was obtained as well. Otherwise she'll return back in one year or when necessary. We did instructed to decrease her bromocriptine to 1 tablet daily. She was reminded to use some form of contraception during the week that she's on the Vibramycin. All the above was discussed with the patient Spanish and we'll follow accordingly.

## 2011-11-05 LAB — GC/CHLAMYDIA PROBE AMP, GENITAL
Chlamydia, DNA Probe: NEGATIVE
GC Probe Amp, Genital: NEGATIVE

## 2011-11-17 ENCOUNTER — Other Ambulatory Visit: Payer: Self-pay | Admitting: Gynecology

## 2011-11-17 DIAGNOSIS — Z1231 Encounter for screening mammogram for malignant neoplasm of breast: Secondary | ICD-10-CM

## 2011-11-30 ENCOUNTER — Ambulatory Visit
Admission: RE | Admit: 2011-11-30 | Discharge: 2011-11-30 | Disposition: A | Discharge status: Home / Self Care | Payer: Managed Care, Other (non HMO) | Source: Ambulatory Visit | Attending: Gynecology | Admitting: Gynecology

## 2011-11-30 DIAGNOSIS — Z1231 Encounter for screening mammogram for malignant neoplasm of breast: Secondary | ICD-10-CM

## 2012-03-08 ENCOUNTER — Ambulatory Visit (INDEPENDENT_AMBULATORY_CARE_PROVIDER_SITE_OTHER): Payer: Managed Care, Other (non HMO) | Admitting: Gynecology

## 2012-03-08 ENCOUNTER — Encounter: Payer: Self-pay | Admitting: Gynecology

## 2012-03-08 VITALS — BP 126/78 | Ht 64.0 in | Wt 187.0 lb

## 2012-03-08 DIAGNOSIS — G8929 Other chronic pain: Secondary | ICD-10-CM

## 2012-03-08 DIAGNOSIS — R102 Pelvic and perineal pain: Secondary | ICD-10-CM

## 2012-03-08 DIAGNOSIS — N949 Unspecified condition associated with female genital organs and menstrual cycle: Secondary | ICD-10-CM

## 2012-03-08 DIAGNOSIS — E230 Hypopituitarism: Secondary | ICD-10-CM

## 2012-03-08 DIAGNOSIS — N7013 Chronic salpingitis and oophoritis: Secondary | ICD-10-CM

## 2012-03-08 DIAGNOSIS — E229 Hyperfunction of pituitary gland, unspecified: Secondary | ICD-10-CM

## 2012-03-08 DIAGNOSIS — Z862 Personal history of diseases of the blood and blood-forming organs and certain disorders involving the immune mechanism: Secondary | ICD-10-CM

## 2012-03-08 DIAGNOSIS — N7011 Chronic salpingitis: Secondary | ICD-10-CM

## 2012-03-08 DIAGNOSIS — Z8639 Personal history of other endocrine, nutritional and metabolic disease: Secondary | ICD-10-CM

## 2012-03-08 LAB — CBC WITH DIFFERENTIAL/PLATELET
Basophils Relative: 0 % (ref 0–1)
Eosinophils Absolute: 0.1 10*3/uL (ref 0.0–0.7)
Eosinophils Relative: 1 % (ref 0–5)
HCT: 36.2 % (ref 36.0–46.0)
Hemoglobin: 12.4 g/dL (ref 12.0–15.0)
MCH: 31 pg (ref 26.0–34.0)
MCHC: 34.3 g/dL (ref 30.0–36.0)
MCV: 90.5 fL (ref 78.0–100.0)
Monocytes Absolute: 0.7 10*3/uL (ref 0.1–1.0)
Monocytes Relative: 8 % (ref 3–12)
RDW: 13.6 % (ref 11.5–15.5)

## 2012-03-08 MED ORDER — TRAMADOL HCL 50 MG PO TABS: 50.0000 mg | ORAL_TABLET | Freq: Four times a day (QID) | ORAL | Status: DC | PRN

## 2012-03-08 MED ORDER — BROMOCRIPTINE MESYLATE 2.5 MG PO TABS: 2.5000 mg | ORAL_TABLET | Freq: Two times a day (BID) | ORAL | Status: DC

## 2012-03-08 NOTE — Patient Instructions (Signed)
Endometriosis (Endometriosis) La endometriosis es un trastorno que se produce cuando existen porciones de endometrio (el recubrimiento del tero) fuera de su ubicacin normal. Puede ocurrir en muchos lugares prximos al tero (matriz), pero generalmente se produce en los ovarios, en las trompas de [place], en la vagina (canal de parto) y en los intestinos que se encuentran cerca del tero. Debido a que el tero elimina (expulsa) su recubrimiento todos los meses (menstruacin), hay una Cabin crew en el que el tejido endometrial est ubicado. SNTOMAS Generalmente no se presentan sntomas (problemas); sin embargo, debido a que la sangre irrita los tejidos que normalmente no estn expuestos a ella, cuando se producen los sntomas, estos varan segn Immunologist al cual se haya desplazado el endometrio. Entre los sntomas se incluyen el dolor de espalda y el dolor abdominal (vientre). Los perodos pueden ser ms abundantes y las relaciones sexuales dolorosas. Puede haber infertilidad. Usted podr sufrir todos estos sntomas al [employer] o no, o puede haber meses en los que no tenga ningn sntoma. Aunque los sntomas se producen principalmente durante las menstruaciones, tambin pueden aparecer en la mitad del ciclo y generalmente terminan con la menopausia. DIAGNSTICO El profesional que la asiste podr indicarle un examen de sangre y de [place] para descartar otros trastornos. Otra prueba frecuente en estos casos es el [place], un procedimiento indoloro que utiliza ondas de sonido para hacer una ecografa del tejido anormal que indica endometriosis. Si siente dolor al mover el intestino durante el perodo, el profesional que la asiste podr indicarle un enema de bario (radiografa de la parte inferior del intestino), para tratar de Contractor origen del dolor. A veces se confirma por medio de una laparoscopa. La laparoscopa es un procedimiento en el que el profesional observa el interior del  abdomen con un laparoscopio (un pequeo telescopio con forma de lpiz). El profesional podr tomar una pequea muestra de tejido (biopsia) de los tejidos anormales para confirmar o Psychiatric nurse problema. Los tejidos se envan al laboratorio y un patlogo los observa bajo el microscopio para dar un diagnstico. TRATAMIENTO Una vez que se realiza el diagnstico, puede tratarse con la destruccin del tejido endometrial desplazado, utilizando calor (diatermia), lser, escisin (corte), o por medios qumicos. Tambin puede tratarse con terapia hormonal. Cuando se utiliza la terapia hormonal, se eliminan las [place], por lo tanto se elimina la exposicin mensual a la sangre del tejido endometrial desplazado. Slo en los casos graves es necesario realizar una histerectoma con extirpacin de las trompas, el tero y los ovarios. INSTRUCCIONES PARA EL CUIDADO DOMICILIARIO  Utilice los medicamentos de venta libre o de prescripcin para Chief Technology Officer, el malestar o la [place], segn se lo indique el profesional que lo asiste.   Evite las actividades que producen dolor, incluyendo las [employer].   No tome aspirinas ya que puede aumentar las hemorragias cuando no recibe terapia hormonal.   Consulte al profesional que la asiste si siente dolor o tiene problemas que no puede controlar con Scientist, research (medical).  SOLICITE ATENCIN MDICA DE INMEDIATO SI:  El dolor es intenso y no responde a Tourist information centre manager.   Comienza a sentir nuseas y vmitos o no puede [employer].   El dolor se localiza en la zona inferior derecha del abdomen (posible apendicitis).   Presenta hinchazn o aumento del dolor en el abdomen.   Tiene fiebre.   Observa sangre en la materia fecal.  EST SEGURO QUE:   Comprende las instrucciones para el alta mdica.  Controlar su enfermedad.   Solicitar atencin mdica de inmediato segn las indicaciones.  Document Released: 06/08/2005 Document Revised:  05/28/2011 [facility] Patient Information 2012 [place], [place].  Laparoscopa diagnstica (Diagnostic Laparoscopy) La laparoscopa es un procedimiento quirrgico relativamente simple, de uso habitual y breve (menos de una hora) que se lleva a cabo para diagnosticar y tratar enfermedades del abdomen. El laparoscopio (tubo delgado, que emite luz, del tamao de un lpiz y similar a un telescopio) se inserta en el abdomen a travs de una pequea incisin (corte realizado por un cirujano). A travs de este instrumento, el profesional podr observar [employer] rganos del interior del abdomen (vientre) y ver si hay algo anormal. La laparoscopa podr llevarse a cabo tanto en el hospital como en un consultorio. Podrn administrarle un sedante suave que lo ayudar a relajarse antes y durante el procedimiento. Una vez en la sala de operaciones, le administrarn una anestesia general (a menos que usted y el profesional elijan otro tipo de anestesia). Despus de la laparoscopa, que generalmente dura menos de Georgianne Fick, ser [employer] sala de recuperacin durante algunas horas. Cuando regrese a Pensions consultant, la Arts administrator [employer] y [place]. RIESGOS Y COMPLICACIONES Comparados con los beneficios, los riesgos de la laparoscopia son relativamente pocos. El Animal nutritionist con usted los riesgos antes del procedimiento. Algunos problemas que pueden ocurrir luego de la intervencin son:  Infecciones.   Hemorragias.   Puede ocurrir que se lesionen otros rganos.   Efectos secundarios de Higher education careers adviser.  PROCEDIMIENTO Una vez que se encuentra anestesiado, el cirujano insufla el abdomen con un gas inofensivo (dixido de carbono) para Research officer, political party observacin de los rganos de la pelvis. El cirujano introduce el laparoscopio a travs de una pequea incisin en el ombligo o alrededor del mismo. Podr insertar otros instrumentos, como una sonda para mover los rganos o Education officer, environmental algn  procedimiento a travs de otra pequea incisin.  En ocasiones se toma una biopsia (muestra de tejido) para un diagnstico ms preciso o para Consulting civil engineer. La biopsia consiste en tomar una pequea muestra de tejido durante la laparoscopia para enviarlo al patlogo (especialista en la observacin de clulas y muestras de tejido) y que lo examine en el microscopio para un diagnstico a nivel de los tejidos. DESPUES DEL PROCEDIMIENTO  Se libera el gas del abdomen.   Las incisiones se cierran con puntos (suturas). Debido a que las incisiones son pequeas (generalmente de menos de 1 cm) las molestias son mnimas luego del procedimiento. Es posible que sienta cierto Sales executive. Es consecuencia de Education officer, community del tubo mientras se encontraba dormido. Es posible que sienta algn dolor abdominal no muy intenso. Tambin podr sentir [employer] en las incisiones realizadas para insertar los instrumentos en el abdomen.   El tiempo de recuperacin es reducido, siempre que no haya habido complicaciones.   Har reposo en la sala de recuperacin hasta que se encuentre estable y se sienta bien. Si no aparecen complicaciones, podr regresar a su casa.  AVERIGE LOS RESULTADOS DE SU ANLISIS Durante su visita no contar con todos los [employer]. En este caso, tenga otra entrevista con su mdico para conocerlos. No piense que el resultado es normal si no tiene noticias de su mdico o de la institucin mdica. Es Copy seguimiento de todos los [place] de [place]. INSTRUCCIONES PARA EL CUIDADO DOMICILIARIO  Tome todos los medicamentos tal como se le indic.   Utilice los [employer] de  venta libre o de prescripcin para Chief Technology Officer, el malestar o la fiebre, segn se lo indique el profesional que lo asiste.   Reanude las actividades habituales cuando se le indique.   Es preferible que se duche y no tome baos de inmersin.   Reanude la actividad sexual luego de  [place], o cuando lo autoricen.   No conduzca mientras se encuentre bajo los efectos de narcticos.  SOLICITE ATENCIN MDICA SI:  Siente un dolor abdominal inexplicable.   Siente dolor en los hombros, en la regin de los tirantes.   Si se siente aturdido o siente que se va a Artist.   Siente escalofros.   Usted o su nio tienen una temperatura oral de ms de 102 F (38.9 C).   Observa un drenaje purulento (similar al pus) que proviene de alguna de las heridas.   Usted o su hijo no puede realizar movimientos intestinales o evacuar gases.   Usted o su hijo sufren nuseas o vmitos.  EST SEGURO QUE:   Comprende las instrucciones para el alta mdica.   Controlar su enfermedad.   Solicitar atencin mdica de inmediato segn las indicaciones.  Document Released: 06/08/2005 Document Revised: 05/28/2011 [employer] Patient Information 2012 [place], [place].

## 2012-03-08 NOTE — Progress Notes (Signed)
The patient is a 41 year old with history of recurrent pregnancy losses who has extensive evaluation the past. She was seen the office on May 15 for a scheduled sonohysterogram. She had been complaining of quite some time of on and off right lower quadrant discomfort. She also has a history of hyperprolactinemia for which she has been taking Parlodel 2.5 mg twice a day and is help regulate her cycles. She ran out of the medication has not taken it in the past week.  Review of patient's records indicated that in June of 2012 it was noted between the left ovary and the left wall of the uterus a tubular shaped solid cystic mass measuring 30 x 22 x 25 mm along with a questionable lower uterine segment myoma but she did not return for followup due to lack of insurance until 11/04/2011.  Ultrasound /sonohysterogram on 11/04/2011: Uterus measured 8.9 x 4.9 x 3.0 cm with an endometrial stripe of 6.4 mm. Anteverted uterus with arcuate configuration. Right ovary with echo-free cyst measuring 25 x 23 mm (follicle) also tubular cystic serpentine shaped mass noted consistent with a hydrosalpinx measuring 29 x 16 x 30 mm. Negative color flow. Left ovary was normal. Previous cystic solid mass on the left side not seen. Sonohysterogram with no intracavitary defect other than the arcuate configuration of the uterus.  Patient currently not attempting to get pregnant is using condoms for contraception. Due to findings on ultrasound of the hydrosalpinx on the right side and her  discomfort on and off on her right lower quadrant a tentative diagnosis of a subacute PID was made and she was placed  on Vibramycin 100 mg twice a day for 7 days. A GC and Chlamydia culture was negative.  Patient presented to the office today stating that her pain is constant although at times it doesn't bother her when she has intercourse not associated with meals not associated with her menses does not feel bloated.  Exam: Abdomen: Soft nontender  no rebound or guarding although some tenderness on deep palpation in the right lower quadrant Pelvic: Bartholin urethra Skene was within normal limits Vagina: No lesions or discharge Cervix: No lesions or discharge Uterus: Anteverted normal size shape and consistency  Adnexa: Tender the right adnexa somewhat difficult to evaluate for a possible mass due to some mild guarding and patient's abdominal girth. Rectal exam: Not done  Assessment/plan: Patient with known right hydrosalpinx possible endometriosis contributing to pain. I've given her literature information on laparoscopy and on endometriosis in Spanish. She'll return back to the office later in the week for ultrasound to compare with previous study 4 months ago and schedule her surgery. She was given a prescription of Ultram 50 mg to take one by mouth q. 6 hours when necessary. She was given her prescription refill of her Parlodel 2.5 mg to take 1 by mouth twice a day. We will check her prolactin level today along with CBC and comprehensive metabolic panel.

## 2012-03-09 ENCOUNTER — Encounter: Payer: Self-pay | Admitting: Gynecology

## 2012-03-09 LAB — COMPREHENSIVE METABOLIC PANEL
ALT: 12 U/L (ref 0–35)
AST: 16 U/L (ref 0–37)
Alkaline Phosphatase: 50 U/L (ref 39–117)
BUN: 15 mg/dL (ref 6–23)
Creat: 0.8 mg/dL (ref 0.50–1.10)

## 2012-03-09 LAB — PROLACTIN: Prolactin: 17 ng/mL

## 2012-03-17 ENCOUNTER — Ambulatory Visit (INDEPENDENT_AMBULATORY_CARE_PROVIDER_SITE_OTHER): Payer: Managed Care, Other (non HMO)

## 2012-03-17 ENCOUNTER — Encounter: Payer: Self-pay | Admitting: Gynecology

## 2012-03-17 ENCOUNTER — Ambulatory Visit (INDEPENDENT_AMBULATORY_CARE_PROVIDER_SITE_OTHER): Payer: Managed Care, Other (non HMO) | Admitting: Gynecology

## 2012-03-17 VITALS — BP 124/80

## 2012-03-17 DIAGNOSIS — R1900 Intra-abdominal and pelvic swelling, mass and lump, unspecified site: Secondary | ICD-10-CM

## 2012-03-17 DIAGNOSIS — N7011 Chronic salpingitis: Secondary | ICD-10-CM

## 2012-03-17 DIAGNOSIS — G8929 Other chronic pain: Secondary | ICD-10-CM

## 2012-03-17 DIAGNOSIS — N949 Unspecified condition associated with female genital organs and menstrual cycle: Secondary | ICD-10-CM

## 2012-03-17 DIAGNOSIS — N7013 Chronic salpingitis and oophoritis: Secondary | ICD-10-CM

## 2012-03-17 NOTE — Progress Notes (Signed)
Kristie Hill is an 41 y.o. female. With past history recurrent pregnancy losses with extensive evaluation with no etiology determine.She was seen the office on May 15 for a scheduled sonohysterogram. She had been complaining of quite some time of on and off right lower quadrant discomfort. She also has a history of hyperprolactinemia for which she has been taking Parlodel 2.5 mg twice a day and is help regulate her cycles.Review of patient's records indicated that in June of 2012 it was noted between the left ovary and the left wall of the uterus a tubular shaped solid cystic mass measuring 30 x 22 x 25 mm along with a questionable lower uterine segment myoma but she did not return for followup due to lack of insurance until 11/04/2011  Ultrasound /sonohysterogram on 11/04/2011:  Uterus measured 8.9 x 4.9 x 3.0 cm with an endometrial stripe of 6.4 mm. Anteverted uterus with arcuate configuration. Right ovary with echo-free cyst measuring 25 x 23 mm (follicle) also tubular cystic serpentine shaped mass noted consistent with a hydrosalpinx measuring 29 x 16 x 30 mm. Negative color flow. Left ovary was normal. Previous cystic solid mass on the left side not seen. Sonohysterogram with no intracavitary defect other than the arcuate configuration of the uterus.  Patient currently not attempting to get pregnant is using condoms for contraception. Due to findings on ultrasound of the hydrosalpinx on the right side and her discomfort on and off on her right lower quadrant a tentative diagnosis of a subacute PID was made and she was placed on Vibramycin 100 mg twice a day for 7 days. A GC and Chlamydia culture was negative.   Patient returned back to the office for followup today in ultrasound and stated she was asymptomatic after the above treatment.  Ultrasound: Uterus 7.4 x 4.2 x 3.3 cm with endometrial stripe is 7.1 mm. Left ovary appears normal. A right adnexal large multicystic mass with blood flow in  the septum measures 7.6 x 5.2 x 5.0 cm was noted. No fluid in the cul-de-sac. Refractive index was 0.28 (normal greater than 0.40).  CA 125 was ordered today at time of her preoperative consultation result pending       Pertinent Gynecological History: Menses: Regular while taking her Parlodel Bleeding: See above Contraception: condoms DES exposure: denies Blood transfusions: none Sexually transmitted diseases: no past history Previous GYN Procedures: Recurrent pregnancy losses D&Cs  Last mammogram: normal Date: 2013 Last pap: normal Date: 2013 OB History: G 5, P 0 A 5   Menstrual History: Menarche age: 29 Patient's last menstrual period was 03/01/2012.    Past Medical History  Diagnosis Date  . Recurrent pregnancy loss   . Oligomenorrhea   . Blood type, Rh negative     O negative  . Hormone disorder 2012    hyperprolactinemia    Past Surgical History  Procedure Date  . Dilation and curettage of uterus 01/07/07  . Dilation and curettage of uterus 10/01/10    D&E  . Dilation and curettage of uterus     3 other times    Family History  Problem Relation Age of Onset  . Heart disease Father   . Cancer Paternal Grandmother     BRAIN  . Cancer Paternal Grandfather     LUNG    Social History:  reports that she has never smoked. She has never used smokeless tobacco. She reports that she drinks alcohol. She reports that she does not use illicit drugs.  Allergies: No Known Allergies   (  Not in a hospital admission)  REVIEW OF SYSTEMS: A ROS was performed and pertinent positives and negatives are included in the history.  GENERAL: No fevers or chills. HEENT: No change in vision, no earache, sore throat or sinus congestion. NECK: No pain or stiffness. CARDIOVASCULAR: No chest pain or pressure. No palpitations. PULMONARY: No shortness of breath, cough or wheeze. GASTROINTESTINAL: No abdominal pain, nausea, vomiting or diarrhea, melena or bright red blood per rectum.  GENITOURINARY: No urinary frequency, urgency, hesitancy or dysuria. MUSCULOSKELETAL: No joint or muscle pain, no back pain, no recent trauma. DERMATOLOGIC: No rash, no itching, no lesions. ENDOCRINE: No polyuria, polydipsia, no heat or cold intolerance. No recent change in weight. HEMATOLOGICAL: No anemia or easy bruising or bleeding. NEUROLOGIC: No headache, seizures, numbness, tingling or weakness. PSYCHIATRIC: No depression, no loss of interest in normal activity or change in sleep pattern.     Blood pressure 124/80, last menstrual period 03/01/2012.  Physical Exam:  HEENT:unremarkable Neck:Supple, midline, no thyroid megaly, no carotid bruits Lungs:  Clear to auscultation no rhonchi's or wheezes Heart:Regular rate and rhythm, no murmurs or gallops Breast Exam: Unremarkable Abdomen: Soft nontender no rebound or guarding Pelvic:BUS within normal limits Vagina: No lesions or discharge Cervix: No lesions or discharge Uterus: Anteverted normal size shape and consistency Adnexa: Tender right adnexa with fullness noted some mild guarding Extremities: No cords, no edema Rectal: Not done  No results found for this or any previous visit (from the past 24 hour(s)).  No results found.  Assessment/Plan: Patient with a right large multicystic mass with positive color flow in the septum measures 7.6 x 5.2 x 5.0 cm. Patient recently treated for suspected PID/hydrosalpinx versus pyosalpinx. Patient asymptomatic today. We discussed the findings. A CA 125 will be drawn today. I've recommended she undergo laparoscopic right ovarian cystectomy possible right oophorectomy possible right salpingo-oophorectomy. The risks benefits and pros and cons of the operation were discussed in Spanish along with the following:                        Patient was counseled as to the risk of surgery to include the following:  1. Infection (prohylactic antibiotics will be administered)  2. DVT/Pulmonary Embolism  (prophylactic pneumo compression stockings will be used)  3.Trauma to internal organs requiring additional surgical procedure to repair any injury to     Internal organs requiring perhaps additional hospitalization days.  4.Hemmorhage requiring transfusion and blood products which carry risks such as anaphylactic reaction, hepatitis and AIDS 5. If operations not being able to be completed laparoscopically an open laparotomy technique may need to be utilized which will require her to stay in the hospital several days.  Patient had received literature information on the procedure scheduled and all her questions were answered in her native tongue and accepts all risk.  [facility] [id number] AMTD@   Amaani Guilbault H 03/17/2012, 11:19 AM

## 2012-03-17 NOTE — Patient Instructions (Addendum)
Quiste ovrico (Ovarian Cyst) Los ovarios son pequeos rganos que se encuentran a cada lado del tero. Los ovarios son los rganos que producen las hormonas femeninas, estrgeno y Education officer, museum. Un quiste en el ovario es una bolsa llena de lquido que puede variar en tamao. Es normal que se formen pequeos quistes en las mujeres en edad de procrear y que an tienen sus perodos Designer, jewellery. Este tipo de quiste se denomina quiste folicular que se transforma en un quiste ovulatorio (quiste del cuerpo lteo) despus de producir los vulos. Si la mujer no queda embarazada, desaparece sin ninguna intervencin. Existen otros tipos de quistes de ovario que pueden causar problemas y necesitan ser tratados. El problema ms grave es que el quiste sea canceroso. Debe advertirse que en las mujeres menopusicas que presentan un quiste de ovario, existe un mayor riesgo de que ese quiste sea canceroso. Deben evaluarse muy rpida y [place], y IT sales professional. Esto es ms importante en las mujeres menopusicas debido al elevado porcentaje de cncer de ovario durante este perodo. CAUSAS Y TIPOS DE CNCER DE OVARIO:  QUISTE FUNCIONAL: El quiste de folculo o cuerpo lteo es un quiste funcional que aparece todos los meses durante la ovulacin, con el ciclo menstrual. Si la mujer no queda embarazada, desaparecen con el prximo ciclo menstrual. Generalmente los quistes funcionales no presentan sntomas.   ENDOMETRIOMA: este quiste aparece en la superficie del tejido del tero. Un quiste se forma en el interior o [employer]. Cada mes se desarrolla un poco ms debido a la sangre del perodo menstrual. Tambin se denomina "quiste de chocolate" debido a que est lleno de sangre que se vuelve color marrn. Este tipo de quiste causa dolor en la zona inferior del abdomen durante las relaciones sexuales y durante el perodo menstrual.   CISTADENOMA: Se desarrolla a partir de las clulas externas del ovario.  Generalmente no son cancerosos. Pueden llegar a ser de gran tamao y causar dolor en la zona baja del abdomen y [employer] [place] [employer] sexuales. Este tipo de quiste puede retorcerse e interrumpir el flujo de [place], lo que causa un dolor muy intenso. Tambin puede romperse y Horticulturist, commercial.   QUISTE DERMOIDE: generalmente este tipo de quiste aparece en ambos ovarios. Puede haber diferentes tipos de tejidos en el quiste. Por ejemplo tejidos de piel, dientes, pelos o cartlago. En general no dan sntomas, excepto que sean muy grandes. Los quistes dermoides rara vez son cancerosos.   OVARIO POLIQUSTICO: es una enfermedad rara relacionada con trastornos hormonales que produce muchos quistes pequeos en ambos ovarios. Estos quistes son similares a los quistes de folculo pero nunca producen vulos y se transforman en cuerpo lteo. Pueden causar aumento del [employer], infertilidad, acn, aumento del vello facial y corporal y falta de perodos menstruales o perodos anormales. Muchas mujeres que sufren este problema presentan diabetes tipo 2. La causa exacta de este problema es desconocida. Un ovario poliqustico rara vez es canceroso.   QUISTE OVRICO TECALUTESTICO Aparece cuando hay demasiada hormona (gonadotrofina corinica humana), la que sobreestimula al ovario para producir vulos. Se observan con frecuencia cuando el mdico estimula los ovarios para la fertilizacin in vitro (bebs de probeta).   QUISTE LUTENICO: Aparece durante el embarazo. En algunos casos raros, produce una obstruccin del canal de parto. Generalmente desaparece despus del parto.  SNTOMAS  Dolor o molestias en la pelvis.   Dolor durante las [employer].   Aumento de la inflamacin en el abdomen.   Perodos menstruales anormales.  Aumento del [employer] perodos [place].   Deja de menstruar y no est embarazada.  DIAGNSTICO El diagnstico puede realizarse durante:  Los exmenes plvicos anuales  o de rutina (frecuente).   Ecografas   Radiografas de la pelvis.   Tomografa computada   Resonancia magntica..   Anlisis de sangre.  TRATAMIENTO  El tratamiento slo consiste en que el mdico controle el quiste [place], durante 2  3 meses. Muchos desaparecen espontnemente, especialmente los quistes funcionales.   Puede aspirarse (secarse) con Marella Bile larga observndolo en una ecografa, o por laparoscopa (insertando un tubo en la pelvis a travs de una pequea incisin).   El quiste puede extirparse con laparoscopa.   En algunos casos es necesario extirparlo a travs de una incisin en la zona inferior del abdomen.   El tratamiento hormonal se utiliza para Restaurant manager, fast food ciertos tipos de [place].   Las pldoras anticonceptivas pueden utilizarse para Restaurant manager, fast food otros tipos.  INSTRUCCIONES PARA EL CUIDADO DOMICILIARIO Siga las indicaciones del profesional con respecto a:  Medicamentos   Visitas de control para evaluar y Pharmacologist.   Puede ser necesario que tenga que volver o concertar una cita con otro profesional para descubrir la causa exacta del quiste, si su mdico no es Research scientist (physical sciences).   Realice un examen plvico y un [place] todos los aos, segn las indicaciones.   Informe al mdico si tubo un quiste de ovario en el pasado.  SOLICITE ATENCIN MDICA SI:  Los perodos se atrasan, son irregulares, le faltan o son dolorosos.   El dolor abdominal (en el vientre) o en la pelvis persisten.   El abdomen se agranda o se hincha.   Siente una opresin en la vejiga o tiene problemas para vaciarla completamente.   Tiene dolor durante las [employer].   Tiene la sensacin de hinchazn, presin o molestias en el abdomen.   Pierde peso sin razn aparente.   Siente un Engineer, maintenance (IT).   Est constipada.   Pierde el apetito.   Aparece acn.   Aumenta el vello facial y Personal assistant.   Lenora Boys de peso sin hacer modificaciones en su actividad  fsica y en su dieta habitual.   Sospecha que est embarazada.  SOLICITE ATENCIN MDICA DE INMEDIATO SI:  Siente dolor abdominal cada vez ms intenso.   Si tiene ganas de vomitar (nuseas).   Le sube repentinamente la fiebre.   Siente dolor abdominal al mover el intestino.   Sus perodos menstruales son ms abundantes que lo habitual.  Document Released: 03/18/2005 Document Revised: 05/28/2011 [facility] Patient Information 2012 [place], [place].  Laparoscopa diagnstica (Diagnostic Laparoscopy) La laparoscopa es un procedimiento quirrgico relativamente simple, de uso habitual y breve (menos de una hora) que se lleva a cabo para diagnosticar y tratar enfermedades del abdomen. El laparoscopio (tubo delgado, que emite luz, del tamao de un lpiz y similar a un telescopio) se inserta en el abdomen a travs de una pequea incisin (corte realizado por un cirujano). A travs de este instrumento, el profesional podr observar [employer] rganos del interior del abdomen (vientre) y ver si hay algo anormal. La laparoscopa podr llevarse a cabo tanto en el hospital como en un consultorio. Podrn administrarle un sedante suave que lo ayudar a relajarse antes y durante el procedimiento. Una vez en la sala de operaciones, le administrarn una anestesia general (a menos que usted y el profesional elijan otro tipo de anestesia). Despus de la laparoscopa, que generalmente dura menos de Georgianne Fick, ser [employer] sala  de recuperacin durante algunas horas. Cuando regrese a Pensions consultant, la Arts administrator [employer] y [place]. RIESGOS Y COMPLICACIONES Comparados con los beneficios, los riesgos de la laparoscopia son relativamente pocos. El Animal nutritionist con usted los riesgos antes del procedimiento. Algunos problemas que pueden ocurrir luego de la intervencin son:  Infecciones.   Hemorragias.   Puede ocurrir que se lesionen otros rganos.   Efectos secundarios de  Higher education careers adviser.  PROCEDIMIENTO Una vez que se encuentra anestesiado, el cirujano insufla el abdomen con un gas inofensivo (dixido de carbono) para Research officer, political party observacin de los rganos de la pelvis. El cirujano introduce el laparoscopio a travs de una pequea incisin en el ombligo o alrededor del mismo. Podr insertar otros instrumentos, como una sonda para mover los rganos o Education officer, environmental algn procedimiento a travs de otra pequea incisin.  En ocasiones se toma una biopsia (muestra de tejido) para un diagnstico ms preciso o para Consulting civil engineer. La biopsia consiste en tomar una pequea muestra de tejido durante la laparoscopia para enviarlo al patlogo (especialista en la observacin de clulas y muestras de tejido) y que lo examine en el microscopio para un diagnstico a nivel de los tejidos. DESPUES DEL PROCEDIMIENTO  Se libera el gas del abdomen.   Las incisiones se cierran con puntos (suturas). Debido a que las incisiones son pequeas (generalmente de menos de 1 cm) las molestias son mnimas luego del procedimiento. Es posible que sienta cierto Sales executive. Es consecuencia de Education officer, community del tubo mientras se encontraba dormido. Es posible que sienta algn dolor abdominal no muy intenso. Tambin podr sentir [employer] en las incisiones realizadas para insertar los instrumentos en el abdomen.   El tiempo de recuperacin es reducido, siempre que no haya habido complicaciones.   Har reposo en la sala de recuperacin hasta que se encuentre estable y se sienta bien. Si no aparecen complicaciones, podr regresar a su casa.  AVERIGE LOS RESULTADOS DE SU ANLISIS Durante su visita no contar con todos los [employer]. En este caso, tenga otra entrevista con su mdico para conocerlos. No piense que el resultado es normal si no tiene noticias de su mdico o de la institucin mdica. Es Copy seguimiento de todos los [place]. INSTRUCCIONES PARA EL CUIDADO DOMICILIARIO  Tome todos los medicamentos tal como se le indic.   Utilice los medicamentos de venta libre o de prescripcin para Chief Technology Officer, Environmental health practitioner o la [place], segn se lo indique el profesional que lo asiste.   Reanude las actividades habituales cuando se le indique.   Es preferible que se duche y no tome baos de inmersin.   Reanude la actividad sexual luego de [place], o cuando lo autoricen.   No conduzca mientras se encuentre bajo los efectos de narcticos.  SOLICITE ATENCIN MDICA SI:  Siente un dolor abdominal inexplicable.   Siente dolor en los hombros, en la regin de los tirantes.   Si se siente aturdido o siente que se va a Artist.   Siente escalofros.   Usted o su nio tienen una temperatura oral de ms de 102 F (38.9 C).   Observa un drenaje purulento (similar al pus) que proviene de alguna de las heridas.   Usted o su hijo no puede realizar movimientos intestinales o evacuar gases.   Usted o su hijo sufren nuseas o vmitos.  EST SEGURO QUE:   Comprende las instrucciones para el alta mdica.   Controlar  su enfermedad.   Solicitar atencin mdica de inmediato segn las indicaciones.  Document Released: 06/08/2005 Document Revised: 05/28/2011 [facility] Patient Information 2012 [place], [place].c

## 2012-03-22 ENCOUNTER — Telehealth: Payer: Self-pay | Admitting: Gynecology

## 2012-03-22 NOTE — Telephone Encounter (Signed)
Left message for patient to call me to discuss surgery scheduled.

## 2012-03-24 ENCOUNTER — Telehealth: Payer: Self-pay | Admitting: Gynecology

## 2012-03-24 ENCOUNTER — Other Ambulatory Visit: Payer: Managed Care, Other (non HMO)

## 2012-03-24 ENCOUNTER — Ambulatory Visit: Payer: Managed Care, Other (non HMO) | Admitting: Gynecology

## 2012-03-24 NOTE — Telephone Encounter (Signed)
Patient returned my call. Debarah Crape spoke with her in Spanish and informed her that surgery is scheduled for Oct 29 7:30am at [facility].  Claudia asked patient regarding her ins because when I called [employer] we have on file they said it terminated on 03/21/12.  Patient has new BCBS plan and provided Debarah Crape that info and I did verify effective and checked benefits and prepared a financial responsibility letter for her.  Patient was instructed to bring her card by when she receives it so that we can update system so surgery can be filed to her new insurance.

## 2012-04-12 ENCOUNTER — Encounter (HOSPITAL_COMMUNITY): Payer: Self-pay | Admitting: Pharmacist

## 2012-04-15 ENCOUNTER — Encounter (HOSPITAL_COMMUNITY)
Admission: RE | Admit: 2012-04-15 | Discharge: 2012-04-15 | Disposition: A | Discharge status: Home / Self Care | Payer: BC Managed Care – PPO | Source: Ambulatory Visit | Attending: Gynecology | Admitting: Gynecology

## 2012-04-15 ENCOUNTER — Encounter (HOSPITAL_COMMUNITY): Payer: Self-pay

## 2012-04-15 LAB — CBC
HCT: 37.6 % (ref 36.0–46.0)
Hemoglobin: 12.4 g/dL (ref 12.0–15.0)
MCH: 30.8 pg (ref 26.0–34.0)
MCHC: 33 g/dL (ref 30.0–36.0)
MCV: 93.5 fL (ref 78.0–100.0)
Platelets: 264 10*3/uL (ref 150–400)
RBC: 4.02 MIL/uL (ref 3.87–5.11)
RDW: 13.8 % (ref 11.5–15.5)
WBC: 9 10*3/uL (ref 4.0–10.5)

## 2012-04-15 LAB — URINALYSIS, ROUTINE W REFLEX MICROSCOPIC
Glucose, UA: NEGATIVE mg/dL
Hgb urine dipstick: NEGATIVE
Ketones, ur: NEGATIVE mg/dL
Leukocytes, UA: NEGATIVE
pH: 7.5 (ref 5.0–8.0)

## 2012-04-15 LAB — SURGICAL PCR SCREEN: MRSA, PCR: POSITIVE — AB

## 2012-04-15 NOTE — Pre-Procedure Instructions (Signed)
Pt was informed by our hospital Spanish interpreter per phone to get her RX filled for + MRSA/MSSA.

## 2012-04-15 NOTE — Patient Instructions (Addendum)
Your procedure is scheduled on:04/19/12  Enter through the Main Entrance at :6am Pick up desk phone and dial [phone] and inform us of your arrival.  Please call [phone] if you have any problems the morning of surgery.  Remember: Do not eat or drink after midnight:Monday   Take these meds the morning of surgery with a sip of water:none  DO NOT wear jewelry, eye make-up, lipstick,body lotion, or dark fingernail polish. Do not shave for 48 hours prior to surgery.   Patients discharged on the day of surgery will not be allowed to drive home.

## 2012-04-19 ENCOUNTER — Ambulatory Visit (HOSPITAL_COMMUNITY): Payer: BC Managed Care – PPO | Admitting: Anesthesiology

## 2012-04-19 ENCOUNTER — Encounter (HOSPITAL_COMMUNITY): Payer: Self-pay | Admitting: Anesthesiology

## 2012-04-19 ENCOUNTER — Ambulatory Visit (HOSPITAL_COMMUNITY)
Admission: RE | Admit: 2012-04-19 | Discharge: 2012-04-19 | Disposition: A | Discharge status: Home / Self Care | Payer: BC Managed Care – PPO | Source: Ambulatory Visit | Attending: Gynecology | Admitting: Gynecology

## 2012-04-19 ENCOUNTER — Encounter (HOSPITAL_COMMUNITY)
Admission: RE | Disposition: A | Discharge status: Home / Self Care | Payer: Self-pay | Source: Ambulatory Visit | Attending: Gynecology

## 2012-04-19 DIAGNOSIS — R1900 Intra-abdominal and pelvic swelling, mass and lump, unspecified site: Secondary | ICD-10-CM

## 2012-04-19 DIAGNOSIS — N83209 Unspecified ovarian cyst, unspecified side: Secondary | ICD-10-CM | POA: Insufficient documentation

## 2012-04-19 DIAGNOSIS — Z01812 Encounter for preprocedural laboratory examination: Secondary | ICD-10-CM | POA: Insufficient documentation

## 2012-04-19 DIAGNOSIS — N838 Other noninflammatory disorders of ovary, fallopian tube and broad ligament: Secondary | ICD-10-CM | POA: Insufficient documentation

## 2012-04-19 DIAGNOSIS — Z01818 Encounter for other preprocedural examination: Secondary | ICD-10-CM | POA: Insufficient documentation

## 2012-04-19 HISTORY — PX: LAPAROSCOPY: SHX197

## 2012-04-19 LAB — PREGNANCY, URINE: Preg Test, Ur: NEGATIVE

## 2012-04-19 SURGERY — LAPAROSCOPY OPERATIVE
Anesthesia: General | Site: Abdomen | Laterality: Right | Wound class: Clean Contaminated

## 2012-04-19 MED ORDER — BUPIVACAINE HCL (PF) 0.25 % IJ SOLN
INTRAMUSCULAR | Status: AC
  Filled 2012-04-19: qty 30

## 2012-04-19 MED ORDER — BUPIVACAINE HCL (PF) 0.25 % IJ SOLN
INTRAMUSCULAR | Status: DC | PRN
  Administered 2012-04-19: 10 mL

## 2012-04-19 MED ORDER — ROCURONIUM BROMIDE 50 MG/5ML IV SOLN
INTRAVENOUS | Status: AC
  Filled 2012-04-19: qty 1

## 2012-04-19 MED ORDER — METOCLOPRAMIDE HCL 10 MG PO TABS: 10.0000 mg | ORAL_TABLET | Freq: Three times a day (TID) | ORAL | Status: DC

## 2012-04-19 MED ORDER — NEOSTIGMINE METHYLSULFATE 1 MG/ML IJ SOLN
INTRAMUSCULAR | Status: DC | PRN
  Administered 2012-04-19: 2 mg via INTRAVENOUS

## 2012-04-19 MED ORDER — PROMETHAZINE HCL 25 MG/ML IJ SOLN: 6.2500 mg | INTRAMUSCULAR | Status: DC | PRN

## 2012-04-19 MED ORDER — PROPOFOL 10 MG/ML IV EMUL
INTRAVENOUS | Status: AC
  Filled 2012-04-19: qty 20

## 2012-04-19 MED ORDER — MIDAZOLAM HCL 2 MG/2ML IJ SOLN
INTRAMUSCULAR | Status: AC
  Filled 2012-04-19: qty 2

## 2012-04-19 MED ORDER — GLYCOPYRROLATE 0.2 MG/ML IJ SOLN
INTRAMUSCULAR | Status: DC | PRN
  Administered 2012-04-19: 0.1 mg via INTRAVENOUS
  Administered 2012-04-19: 0.4 mg via INTRAVENOUS

## 2012-04-19 MED ORDER — ONDANSETRON HCL 4 MG/2ML IJ SOLN
INTRAMUSCULAR | Status: DC | PRN
  Administered 2012-04-19: 4 mg via INTRAVENOUS

## 2012-04-19 MED ORDER — PROPOFOL 10 MG/ML IV BOLUS
INTRAVENOUS | Status: DC | PRN
  Administered 2012-04-19: 170 mg via INTRAVENOUS
  Administered 2012-04-19: 30 mg via INTRAVENOUS

## 2012-04-19 MED ORDER — LACTATED RINGERS IR SOLN
Status: DC | PRN
  Administered 2012-04-19: 3000 mL

## 2012-04-19 MED ORDER — HYDROCODONE-ACETAMINOPHEN 5-500 MG PO TABS: ORAL_TABLET | ORAL | Status: DC

## 2012-04-19 MED ORDER — FENTANYL CITRATE 0.05 MG/ML IJ SOLN
INTRAMUSCULAR | Status: AC
  Filled 2012-04-19: qty 2

## 2012-04-19 MED ORDER — GLYCOPYRROLATE 0.2 MG/ML IJ SOLN
INTRAMUSCULAR | Status: AC
  Filled 2012-04-19: qty 3

## 2012-04-19 MED ORDER — DEXAMETHASONE SODIUM PHOSPHATE 10 MG/ML IJ SOLN
INTRAMUSCULAR | Status: DC | PRN
  Administered 2012-04-19: 10 mg via INTRAVENOUS

## 2012-04-19 MED ORDER — HYDROMORPHONE HCL PF 1 MG/ML IJ SOLN: 0.2500 mg | INTRAMUSCULAR | Status: DC | PRN

## 2012-04-19 MED ORDER — CEFAZOLIN SODIUM-DEXTROSE 2-3 GM-% IV SOLR
INTRAVENOUS | Status: AC
  Administered 2012-04-19: 2 g via INTRAVENOUS
  Filled 2012-04-19: qty 50

## 2012-04-19 MED ORDER — KETOROLAC TROMETHAMINE 30 MG/ML IJ SOLN: 15.0000 mg | Freq: Once | INTRAMUSCULAR | Status: DC | PRN

## 2012-04-19 MED ORDER — LACTATED RINGERS IV SOLN
INTRAVENOUS | Status: DC
  Administered 2012-04-19 (×2): via INTRAVENOUS

## 2012-04-19 MED ORDER — FENTANYL CITRATE 0.05 MG/ML IJ SOLN
INTRAMUSCULAR | Status: DC | PRN
  Administered 2012-04-19 (×7): 50 ug via INTRAVENOUS

## 2012-04-19 MED ORDER — ONDANSETRON HCL 4 MG/2ML IJ SOLN
INTRAMUSCULAR | Status: AC
  Filled 2012-04-19: qty 2

## 2012-04-19 MED ORDER — FENTANYL CITRATE 0.05 MG/ML IJ SOLN
INTRAMUSCULAR | Status: AC
  Filled 2012-04-19: qty 5

## 2012-04-19 MED ORDER — LIDOCAINE HCL (CARDIAC) 20 MG/ML IV SOLN
INTRAVENOUS | Status: AC
  Filled 2012-04-19: qty 5

## 2012-04-19 MED ORDER — DEXAMETHASONE SODIUM PHOSPHATE 10 MG/ML IJ SOLN
INTRAMUSCULAR | Status: AC
  Filled 2012-04-19: qty 1

## 2012-04-19 MED ORDER — MEPERIDINE HCL 25 MG/ML IJ SOLN: 6.2500 mg | INTRAMUSCULAR | Status: DC | PRN

## 2012-04-19 MED ORDER — CEFAZOLIN SODIUM-DEXTROSE 2-3 GM-% IV SOLR: 2.0000 g | INTRAVENOUS | Status: DC

## 2012-04-19 MED ORDER — ROCURONIUM BROMIDE 100 MG/10ML IV SOLN
INTRAVENOUS | Status: DC | PRN
  Administered 2012-04-19: 40 mg via INTRAVENOUS

## 2012-04-19 MED ORDER — MIDAZOLAM HCL 5 MG/5ML IJ SOLN
INTRAMUSCULAR | Status: DC | PRN
  Administered 2012-04-19: 2 mg via INTRAVENOUS

## 2012-04-19 MED ORDER — HEPARIN SODIUM (PORCINE) 5000 UNIT/ML IJ SOLN
INTRAMUSCULAR | Status: DC | PRN
  Administered 2012-04-19: 5000 [IU]

## 2012-04-19 MED ORDER — NEOSTIGMINE METHYLSULFATE 1 MG/ML IJ SOLN
INTRAMUSCULAR | Status: AC
  Filled 2012-04-19: qty 10

## 2012-04-19 MED ORDER — LIDOCAINE HCL (CARDIAC) 20 MG/ML IV SOLN
INTRAVENOUS | Status: DC | PRN
  Administered 2012-04-19: 80 mg via INTRAVENOUS

## 2012-04-19 SURGICAL SUPPLY — 32 items
ADH SKN CLS APL DERMABOND .7 (GAUZE/BANDAGES/DRESSINGS)
BAG SPEC RTRVL LRG 6X4 10 (ENDOMECHANICALS)
BARRIER ADHS 3X4 INTERCEED (GAUZE/BANDAGES/DRESSINGS) IMPLANT
BLADE SURG 15 STRL LF C SS BP (BLADE) ×2 IMPLANT
BLADE SURG 15 STRL SS (BLADE) ×3
BRR ADH 4X3 ABS CNTRL BYND (GAUZE/BANDAGES/DRESSINGS)
CABLE HIGH FREQUENCY MONO STRZ (ELECTRODE) IMPLANT
CLOTH BEACON ORANGE TIMEOUT ST (SAFETY) ×3 IMPLANT
DERMABOND ADVANCED (GAUZE/BANDAGES/DRESSINGS)
DERMABOND ADVANCED .7 DNX12 (GAUZE/BANDAGES/DRESSINGS) IMPLANT
FILTER SMOKE EVAC LAPAROSHD (FILTER) ×2 IMPLANT
GLOVE BIOGEL PI IND STRL 8 (GLOVE) ×2 IMPLANT
GLOVE BIOGEL PI INDICATOR 8 (GLOVE) ×1
GLOVE ECLIPSE 7.5 STRL STRAW (GLOVE) ×6 IMPLANT
GOWN PREVENTION PLUS LG XLONG (DISPOSABLE) ×9 IMPLANT
NS IRRIG 1000ML POUR BTL (IV SOLUTION) ×1 IMPLANT
PACK LAPAROSCOPY BASIN (CUSTOM PROCEDURE TRAY) ×3 IMPLANT
POUCH SPECIMEN RETRIEVAL 10MM (ENDOMECHANICALS) IMPLANT
PROTECTOR NERVE ULNAR (MISCELLANEOUS) ×7 IMPLANT
SCALPEL HARMONIC ACE (MISCELLANEOUS) ×2 IMPLANT
SET IRRIG TUBING LAPAROSCOPIC (IRRIGATION / IRRIGATOR) ×2 IMPLANT
SLEEVE Z-THREAD 5X100MM (TROCAR) ×2 IMPLANT
SOLUTION ELECTROLUBE (MISCELLANEOUS) IMPLANT
SUT PLAIN 4 0 FS 2 27 (SUTURE) ×3 IMPLANT
SUT VIC AB 3-0 PS2 18 (SUTURE) ×3
SUT VIC AB 3-0 PS2 18XBRD (SUTURE) ×2 IMPLANT
SUT VICRYL 0 UR6 27IN ABS (SUTURE) ×3 IMPLANT
TOWEL OR 17X24 6PK STRL BLUE (TOWEL DISPOSABLE) ×8 IMPLANT
TRAY FOLEY CATH 14FR (SET/KITS/TRAYS/PACK) ×1 IMPLANT
TROCAR Z-THREAD FIOS 11X100 BL (TROCAR) ×3 IMPLANT
TROCAR Z-THREAD FIOS 5X100MM (TROCAR) ×3 IMPLANT
WARMER LAPAROSCOPE (MISCELLANEOUS) ×2 IMPLANT

## 2012-04-19 NOTE — Progress Notes (Signed)
Eda Royal used in Phase II for review of discharge instructions with patient and husband.

## 2012-04-19 NOTE — Anesthesia Procedure Notes (Signed)
Procedure Name: Intubation Date/Time: 04/19/2012 7:40 AM Performed by: Graciela Husbands Pre-anesthesia Checklist: Suction available, Emergency Drugs available, Timeout performed, Patient identified and Patient being monitored Patient Re-evaluated:Patient Re-evaluated prior to inductionOxygen Delivery Method: Circle system utilized Preoxygenation: Pre-oxygenation with 100% oxygen Intubation Type: IV induction Ventilation: Mask ventilation without difficulty Laryngoscope Size: Mac and 3 Grade View: Grade I Number of attempts: 1 Airway Equipment and Method: Patient positioned with wedge pillow and Stylet Placement Confirmation: ETT inserted through vocal cords under direct vision,  positive ETCO2 and breath sounds checked- equal and bilateral Secured at: 22 cm Tube secured with: Tape Dental Injury: Teeth and Oropharynx as per pre-operative assessment

## 2012-04-19 NOTE — Anesthesia Preprocedure Evaluation (Signed)
Anesthesia Evaluation  Patient identified by MRN, date of birth, ID band Patient awake    Reviewed: Allergy & Precautions, H&P , NPO status , Patient's Chart, lab work & pertinent test results  Airway Mallampati: II TM Distance: >3 FB Neck ROM: full    Dental  (+) Teeth Intact   Pulmonary neg pulmonary ROS,  breath sounds clear to auscultation  Pulmonary exam normal       Cardiovascular negative cardio ROS      Neuro/Psych negative neurological ROS  negative psych ROS   GI/Hepatic negative GI ROS, Neg liver ROS,   Endo/Other  negative endocrine ROS  Renal/GU negative Renal ROS  negative genitourinary   Musculoskeletal negative musculoskeletal ROS (+)   Abdominal Normal abdominal exam  (+)   Peds  Hematology negative hematology ROS (+)   Anesthesia Other Findings   Reproductive/Obstetrics negative OB ROS                           Anesthesia Physical Anesthesia Plan  ASA: II  Anesthesia Plan: General   Post-op Pain Management:    Induction: Intravenous  Airway Management Planned: Oral ETT  Additional Equipment:   Intra-op Plan:   Post-operative Plan: Extubation in OR  Informed Consent: I have reviewed the patients History and Physical, chart, labs and discussed the procedure including the risks, benefits and alternatives for the proposed anesthesia with the patient or authorized representative who has indicated his/her understanding and acceptance.   Dental Advisory Given  Plan Discussed with: CRNA and Surgeon  Anesthesia Plan Comments:         Anesthesia Quick Evaluation

## 2012-04-19 NOTE — Anesthesia Postprocedure Evaluation (Signed)
  Anesthesia Post-op Note  Patient: Kristie Hill  Procedure(s) Performed: Procedure(s) (LRB) with comments: LAPAROSCOPY OPERATIVE (Right) - Excision of right para-tubal mass. Aspiration of Left ovarian cyst, pelvic washings  Patient is awake and responsive. Pain and nausea are reasonably well controlled. Vital signs are stable and clinically acceptable. Oxygen saturation is clinically acceptable. There are no apparent anesthetic complications at this time. Patient is ready for discharge.

## 2012-04-19 NOTE — Transfer of Care (Signed)
Immediate Anesthesia Transfer of Care Note  Patient: Kristie Hill  Procedure(s) Performed: Procedure(s) (LRB) with comments: LAPAROSCOPY OPERATIVE (Right) - Excision of right para-tubal mass. Aspiration of Left ovarian cyst, pelvic washings   Patient Location: PACU  Anesthesia Type:General  Level of Consciousness: awake, alert  and oriented  Airway & Oxygen Therapy: Patient Spontanous Breathing and Patient connected to nasal cannula oxygen  Post-op Assessment: Report given to PACU RN and Post -op Vital signs reviewed and stable  Post vital signs: Reviewed and stable  Complications: No apparent anesthesia complications

## 2012-04-19 NOTE — Op Note (Signed)
04/19/2012  9:21 AM  PATIENT:  Kristie Hill  41 y.o. female  PRE-OPERATIVE DIAGNOSIS:  right ovarian cyst  POST-OPERATIVE DIAGNOSIS: Right paratubal cyst and simple left ovarian cyst, right pelvic adhesions  PROCEDURE:  Procedure(s): LAPAROSCOPY OPERATIVE Excision of right paratubal cyst Aspiration of left simple ovarian cyst Pelvic washings  SURGEON:  Surgeon(s): Ok Edwards, MD Dara Lords, MD  ANESTHESIA:   general  FINDINGS: 7 cm greenish appearing paratubal mass. Left simple ovarian cyst 2 x 3 cm. Anterior and posterior cul-de-sac without endometriosis. Left paratubal and ovarian adhesions. Lush bilateral fimbria. Appendix not seen. Smooth liver surface.  DESCRIPTION OF OPERATION: The patient was taken to the operating room where she underwent a successful general endotracheal anesthesia. Time out was accomplished by properly identifying the patient and the procedure to be undertaken. The patient was placed in low lithotomy position and the abdomen vagina and perineum were prepped and draped in usual sterile fashion a bimanual examination demonstrated an anteverted uterus and a fullness in the right adnexal region. A Hulka tenaculum was placed for manipulation during laparoscopic procedure. A red rubber Robinson catheter had been inserted to evacuate the bladder of its contents for approximately 50 cc. Patient had PAS stockings and received Cefotan 2 g IV preoperatively. After the drapes were in place a semilunar incision was made in the infraumbilical region. A 10/11 Optiview trocar was inserted into the peritoneal cavity and a pneumoperitoneum was established with carbon dioxide for approximately 2-1/2 L,  two additional 5 mm ports were made on the right and left lower abdomen under laparoscopic guidance. Systematic inspection of the pelvic cavity as described above. Pelvic washings were obtained and submitted for cytological evaluation. With the use of the  Harmonic Scalpel the right paratubal mass was coaptated transecting from the fallopian tube an extruded clear fluid the walls were submitted for histological evaluation. Adequate hemostasis was evident. Attention was then placed in the left adnexa which had some filmy adhesions near the distal end of the fallopian tube and ovary which were freed with the Harmonic Scalpel. It was noted that the patient had a 2 x 3 cm simple left ovarian cyst smooth in appearance with no excrescences. A small incision was made and clear fluid extruded. Of note the contralateral ovary was completely normal. The pelvic cavity scope was irrigated with normal saline solution. Systematic inspection demonstrated adequate hemostasis. The pneumoperitoneum was removed. And the trochars were removed as well. The subumbilical fascia was closed with a figure-of-eight of 0 Vicryl suture and the subcuticulars tissue was reapproximated with a running stitch of 3-0 Vicryl suture. All 3 port sites were closed with Dermabond glue. For postoperative analgesia 0.25% Marcaine was infiltrated at all port sites for a total of 10 cc. The Hulka tenaculum was removed the cervix was inspected and no additional bleeding was noted. Patient was extubated and transferred to recovery stable vital signs. Blood loss was minimal. ESTIMATED BLOOD LOSS: Minimal  Intake/Output Summary (Last 24 hours) at 04/19/12 1308 Last data filed at 04/19/12 0847  Gross per 24 hour  Intake   1600 ml  Output     85 ml  Net   1515 ml     BLOOD ADMINISTERED:none   LOCAL MEDICATIONS USED:  MARCAINE   0.25% subcutaneous at all 3 port sites for a total 10 cc  SPECIMEN:  Source of Specimen:  Right paratubal cyst wall, Pelvic washings  DISPOSITION OF SPECIMEN:  PATHOLOGY  COUNTS:  YES  PLAN OF CARE: Transfer  to PACU  [facility] HMD9:21 AMTD@

## 2012-04-19 NOTE — H&P (Signed)
Kristie Hill is an 41 y.o. female. With past history recurrent pregnancy losses with extensive evaluation with no etiology determine.She was seen the office on May 15 for a scheduled sonohysterogram. She had been complaining of quite some time of on and off right lower quadrant discomfort. She also has a history of hyperprolactinemia for which she has been taking Parlodel 2.5 mg twice a day and is help regulate her cycles.Review of patient's records indicated that in June of 2012 it was noted between the left ovary and the left wall of the uterus a tubular shaped solid cystic mass measuring 30 x 22 x 25 mm along with a questionable lower uterine segment myoma but she did not return for followup due to lack of insurance until 11/04/2011   Ultrasound /sonohysterogram on 11/04/2011:  Uterus measured 8.9 x 4.9 x 3.0 cm with an endometrial stripe of 6.4 mm. Anteverted uterus with arcuate configuration. Right ovary with echo-free cyst measuring 25 x 23 mm (follicle) also tubular cystic serpentine shaped mass noted consistent with a hydrosalpinx measuring 29 x 16 x 30 mm. Negative color flow. Left ovary was normal. Previous cystic solid mass on the left side not seen. Sonohysterogram with no intracavitary defect other than the arcuate configuration of the uterus.  Patient currently not attempting to get pregnant is using condoms for contraception. Due to findings on ultrasound of the hydrosalpinx on the right side and her discomfort on and off on her right lower quadrant a tentative diagnosis of a subacute PID was made and she was placed on Vibramycin 100 mg twice a day for 7 days. A GC and Chlamydia culture was negative .  Patient returned back to the office for followup today and  ultrasound and stated she was asymptomatic after the above treatment.  Ultrasound: Uterus 7.4 x 4.2 x 3.3 cm with endometrial stripe is 7.1 mm. Left ovary appears normal. A right adnexal large multicystic mass with blood flow in  the septum measures 7.6 x 5.2 x 5.0 cm was noted. No fluid in the cul-de-sac. Refractive index was 0.28 (normal greater than 0.40).  CA 125 was ordered today at time of her preoperative consultation result pending    Pertinent Gynecological History:  Menses: Regular while taking her Parlodel  Bleeding: See above  Contraception: condoms  DES exposure: denies  Blood transfusions: none  Sexually transmitted diseases: no past history  Previous GYN Procedures: Recurrent pregnancy losses D&Cs  Last mammogram: normal Date: 2013  Last pap: normal Date: 2013  OB History: G 5, P 0 A 5  Menstrual History:  Menarche age: 78  Patient's last menstrual period was 03/01/2012.   Past Medical History   Diagnosis  Date   .  Recurrent pregnancy loss    .  Oligomenorrhea    .  Blood type, Rh negative      O negative   .  Hormone disorder  2012     hyperprolactinemia    Past Surgical History   Procedure  Date   .  Dilation and curettage of uterus  01/07/07   .  Dilation and curettage of uterus  10/01/10     D&E   .  Dilation and curettage of uterus      3 other times    Family History   Problem  Relation  Age of Onset   .  Heart disease  Father    .  Cancer  Paternal Grandmother       BRAIN    .  Cancer  Paternal Grandfather       LUNG    Social History: reports that she has never smoked. She has never used smokeless tobacco. She reports that she drinks alcohol. She reports that she does not use illicit drugs.  Allergies: No Known Allergies   (Not in a hospital admission)  REVIEW OF SYSTEMS: A ROS was performed and pertinent positives and negatives are included in the history.  GENERAL: No fevers or chills. HEENT: No change in vision, no earache, sore throat or sinus congestion. NECK: No pain or stiffness. CARDIOVASCULAR: No chest pain or pressure. No palpitations. PULMONARY: No shortness of breath, cough or wheeze. GASTROINTESTINAL: No abdominal pain, nausea, vomiting or diarrhea,  melena or bright red blood per rectum. GENITOURINARY: No urinary frequency, urgency, hesitancy or dysuria. MUSCULOSKELETAL: No joint or muscle pain, no back pain, no recent trauma. DERMATOLOGIC: No rash, no itching, no lesions. ENDOCRINE: No polyuria, polydipsia, no heat or cold intolerance. No recent change in weight. HEMATOLOGICAL: No anemia or easy bruising or bleeding. NEUROLOGIC: No headache, seizures, numbness, tingling or weakness. PSYCHIATRIC: No depression, no loss of interest in normal activity or change in sleep pattern.  Blood pressure 124/80, last menstrual period 03/01/2012.  Physical Exam:  HEENT:unremarkable  Neck:Supple, midline, no thyroid megaly, no carotid bruits  Lungs: Clear to auscultation no rhonchi's or wheezes  Heart:Regular rate and rhythm, no murmurs or gallops  Breast Exam: Unremarkable  Abdomen: Soft nontender no rebound or guarding  Pelvic:BUS within normal limits  Vagina: No lesions or discharge  Cervix: No lesions or discharge  Uterus: Anteverted normal size shape and consistency  Adnexa: Tender right adnexa with fullness noted some mild guarding  Extremities: No cords, no edema  Rectal: Not done  No results found for this or any previous visit (from the past 24 hour(s)).  No results found.   Assessment/Plan:  Patient with a right large multicystic mass with positive color flow in the septum measures 7.6 x 5.2 x 5.0 cm. Patient recently treated for suspected PID/hydrosalpinx versus pyosalpinx. Patient asymptomatic today. We discussed the findings. A CA 125 will be drawn today. I've recommended she undergo laparoscopic right ovarian cystectomy possible right oophorectomy possible right salpingo-oophorectomy. The risks benefits and pros and cons of the operation were discussed in Spanish along with the following:   Patient was counseled as to the risk of surgery to include the following:  1. Infection (prohylactic antibiotics will be administered)  2.  DVT/Pulmonary Embolism (prophylactic pneumo compression stockings will be used)  3.Trauma to internal organs requiring additional surgical procedure to repair any injury to  Internal organs requiring perhaps additional hospitalization days.  4.Hemmorhage requiring transfusion and blood products which carry risks such as anaphylactic reaction, hepatitis and AIDS  5. If operations not being able to be completed laparoscopically an open laparotomy technique may need to be utilized which will require her to stay in the hospital several days.  Patient had received literature information on the procedure scheduled and all her questions were answered in her native tongue and accepts all risk.

## 2012-04-20 ENCOUNTER — Encounter (HOSPITAL_COMMUNITY): Payer: Self-pay | Admitting: Gynecology

## 2012-04-20 MED FILL — Heparin Sodium (Porcine) Inj 5000 Unit/ML: INTRAMUSCULAR | Qty: 1 | Status: AC

## 2012-05-09 ENCOUNTER — Encounter: Payer: Self-pay | Admitting: Gynecology

## 2012-05-09 ENCOUNTER — Ambulatory Visit (INDEPENDENT_AMBULATORY_CARE_PROVIDER_SITE_OTHER): Payer: BC Managed Care – PPO | Admitting: Gynecology

## 2012-05-09 VITALS — BP 118/78

## 2012-05-09 DIAGNOSIS — Z23 Encounter for immunization: Secondary | ICD-10-CM

## 2012-05-09 DIAGNOSIS — Z9889 Other specified postprocedural states: Secondary | ICD-10-CM

## 2012-05-09 MED ORDER — METRONIDAZOLE 0.75 % VA GEL: 1.0000 | Freq: Two times a day (BID) | VAGINAL | Status: DC

## 2012-05-09 NOTE — Progress Notes (Signed)
Patient is a 41 year old who presented to the office for her postoperative visit. She is status post excision of right paratubal cyst and aspiration of left ovarian cyst with pelvic washings and lysis of pelvic adhesions. Patient is doing well. Pathology report was discussed as follows:  Soft tissue mass, simple excision, right paratubal mass - SEROUS CYSTADENOMA. - NO ATYPIA OR MALIGNANCY IDENTIFIED.  PERITONEAL WASHING: REACTIVE MESOTHELIAL CELLS. INFLAMMATORY CELLS  Exam: Abdomen soft nontender no rebound or guarding. Subumbilical suture removed. 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 Patient to the examination is tell me that time she has is fishy odor discharge so I'm going to call in a prescription for MetroGel to apply intravaginally each bedtime for 5 days. For future prevention of vaginal infections I told that she can buy over-the-counter refresh and apply intravaginally 2 times a week. Patient with history of hyperprolactinemia some Parlodel 2.5 mg twice a day and is having normal menstrual cycles otherwise. She will continue to take her prenatal vitamins in the event that she conceived. She doesn't she does conceive she should discontinue the Parlodel. She's otherwise scheduled to return back to work next week and return to the office next year for annual exam. She requested and received today the flu vaccine.

## 2012-05-09 NOTE — Patient Instructions (Addendum)
Vacuna desactivada contra la influenza, Lo que usted necesita saber (Inactivated Influenza Vaccine, What You Need to Know) POR QU VACUNARSE?  La influenza (conocida como gripe o "flu") es una enfermedad contagiosa.  Es causada por el virus de la influenza, que se puede transmitir al toser, estornudar o mediante las secreciones nasales.  A cualquiera le puede dar influenza, pero los ndices de infeccin son mayores entre los nios. La mayora de las personas solo experimentan sntomas por unos pocos das e incluyen:  Fiebre o escalofros.  Dolor de garganta.  Dolores musculares.  Cansancio.  Tos.  Dolor de cabeza.  Nariz moquienta o congestionada. Otras enfermedades pueden tener los mismos sntomas y a menudo se confunden con la influenza. Los nios pequeos, las personas mayores de 65 aos de edad, las mujeres embarazadas y las personas con ciertas condiciones de salud, como enfermedades del corazn, pulmn o rin o un sistema inmunolgico debilitado, se pueden enfermar mucho ms. La influenza puede causar fiebre alta y neumona y puede empeorar condiciones de salud preexistentes. Puede causar diarrea y convulsiones en los nios. Miles de personas mueren cada ao por la influenza y muchas ms requieren hospitalizacin. Si se vacuna, puede protegerse usted mismo y evitar contagiar a otros. VACUNA DESACTIVADA CONTRA LA INFLUENZA  Hay dos tipos de vacuna contra la influenza:  La vacuna inactivada (el virus est inactivo), de la "vacuna contra la gripe" se aplica con una aguja.  La vacuna viva atenuada (debilitado), que see aplica como roco en las fosas nasales. Esta vacuna se describe en una Hoja de Informacin sobre las Vacunas, por separado. Hay una "dosis ms alta" de vacuna desactivada disponible para personas mayores de 65 aos. Para ms informacin, consulte a su doctor.  Cada ao los cientficos tratan de que los virus de la vacuna coincidan con los que tienen ms  probabilidades de causar la influenza ese ao. La vacuna contra la influenza no prevendr otras enfermedades causadas por otros virus, incluyendo los virus de influenza que no estn incluidos en la vacuna. Despus de la vacunacin, toma hasta 2 semanas para desarrollar proteccin. La proteccin dura hasta un ao. Algunas vacunas desactivadas contra la influenza contienen un conservante llamado timerosal. La vacuna libre de timerosal tambin est disponible. Consulte a su doctor para ms informacin. QUINES DEBEN RECIBIR LA VACUNA DESACTIVADA CONTRA LA INFLUENZA Y CUNDO? QUINES  Todas las personas mayores de 6 meses de edad deben recibir la vacuna contra la influenza.  La vacunacin es especialmente importante para las personas con mayor riesgo de experimentar un caso grave de influenza y las que estn en contacto directo con ellas, incluyendo al personal mdico, y las personas en contacto cercano con bebs menores de 6 meses de edad. CUNDO Reciba la vacuna tan pronto como est disponible. Esto le dar la proteccin necesaria en caso de que la temporada de influenza llegue temprano. Puede vacunarse durante todo el tiempo en el que la enfermedad siga ocurriendo en su comunidad. La influenza puede ocurrir a cualquier momento, pero la mayora de influenza ocurre desde octubre hasta mayo. En las ltimas temporadas, la mayora de las infecciones han ocurrido en enero y febrero. Vacunndose en diciembre, o an despus, ser beneficioso en casi todos los aos. Los adultos y los nios mayores requieren una dosis de la vacuna contra la influenza cada ao. Sin embargo, algunos nios menores de 9 aos de edad necesitan dos dosis para estar protegidos. Consulte a su doctor. Se puede dar la vacuna contra la influenza a la misma   vez que otras vacunas, incluyendo la vacuna antineumoccica. ALGUNAS PERSONAS NO DEBEN RECIBIR LA VACUNA DESACTIVADA CONTRA LA INFLUENZA O DEBEN ESPERAR  Diga a su doctor si tiene  cualquier alergia grave (que amenaza la vida), incluyendo alergia grave a los huevos. Una grave alergia a cualquier componente de la vacuna puede ser razn para no vacunarse. Las reacciones alrgicas a la vacuna contra la influenza son poco comunes.  Diga a su doctor si alguna vez ha tenido una reaccin grave despus de haber recibido una dosis de la vacuna contra la influenza.  Diga a su doctor si alguna vez ha tenido el sndrome de Guillain-Barr (una enfermedad paraltica grave, tambin conocida como GBS). Su doctor le puede ayudar a decidir si es recomendable vacunarse.  Las personas moderadamente o muy enfermas por lo general deben esperar hasta recuperarse antes de vacunarse contra la influenza. Si est enfermo, hable con su doctor sobre si debe cambiar la cita para vacunarse. Las personas con una enfermedad leve por lo general se pueden vacunar. CULES SON LOS RIESGOS DE LA VACUNA DESACTIVADA CONTRA LA INFLUENZA? Los vacunas, como cualquier medicamento, pueden causar problemas serios, como reacciones alrgicas graves. El riesgo de que la vacuna cause un dao serio, o la muerte, es sumamente pequeo. Problemas serios de la vacuna desactivada contra la influenza ocurren muy rara vez. Los virus en la vacuna desactivada estn muertos o sea que no se puede enfermar de influenza mediante la vacuna. Problemas leves:  Molestia, enrojecimiento o hinchazn en el lugar donde lo vacunaron.  Ronquera; dolor, enrojecimiento y picazn en los ojos; tos.  Fiebre.  Dolores.  Dolor de cabeza.  Picazn.  Cansancio. Si estos problemas ocurren, en general comienzan poco tiempo despus de vacunarse y duran 1  2 das. Problemas moderados: Los nios pequeos que reciben la vacuna contra la influenza desactivada y la vacuna antineumoccica (PCV13) durante la misma cita parecen correr mayor riego de tener convulsiones por causa de fiebre. Consulte a su doctor para ms informacin. Diga a su doctor si el  nio que est recibiendo la vacuna contra la influenza ha tenido una convulsin. Problemas graves:  Las reacciones alrgicas que amenazan la vida ocurren muy rara vez despus de la vacunacin. Si ocurren, por lo general es a los pocos minutos o a las pocas horas de haberse vacunado.  En 1976, un tipo de vacuna contra la influenza (gripe porcina) estuvo asociado al sndrome de Guillain-Barr (GBS). Desde entonces, las vacunas contra la influenza no se han asociado claramente al GBS. Sin embargo, si hay un riesgo de GBS por las vacunas contra la influenza que se usan actualmente, no debe ser ms de 1  2 casos por milln de personas vacunadas. Eso es mucho menor que el riesgo de tener una influenza fuerte, que se puede prevenir con vacunacin. Siempre se seguir prestando atencin a la seguridad de las vacunas. Para ms informacin visite:  www.cdc.gov/vaccinesafety/Vaccine_Monitoring/Index.html y  www.cdc.gov/vaccinesafety/Activities/Activities_Index.html Una marca de la vacuna desactivada contra la influenza, llamada Afluria, no se debe dar a nios menores de 8 aos de edad, con la excepcin de circunstancias especiales. En [place] una vacuna relacionada estuvo asociada a fiebre y convulsiones febriles en nios pequeos. Su doctor le puede proporcionar ms informacin. QU PASA SI HAY UNA REACCIN GRAVE? A qu debo prestar atencin? Cualquier estado poco habitual, como fiebre alta o cambios en el comportamiento. Los signos de una reaccin alrgica grave pueden incluir dificultad para respirar, ronquera o sibilancias, ronchas, palidez, debilidad, latidos cardacos acelerados, o mareos. Qu debo   hacer?  Llame a un doctor o lleve a la persona inmediatamente a un doctor.  Dga a su doctor lo que ocurri, la fecha y hora en que ocurri, y cuando recibi la vacuna.  Pida a su mdico, enfermero o al departamento de salud, que informe sobre la reaccin llenando un formulario del Sistema de  Informacin de Reacciones Adversos a las Vacunas (VAERS, por sus siglas en ingls). O, puede presentar este informe mediante el sitio Web de VAERS, en:www.vaers.hhs.gov o llamando al: 1-800-822-7967. VAERS no proporciona consejos mdicos. PROGRAMA NACIONAL DE COMPENSACIN POR LESIONES CAUSADAS POR VACUNAS El Programa Nacional de Compensacin por Lesiones Causadas por las Vacunas (VICP) fue creado en 1986.  Las personas que piensan haber sido lesionadas por alguna vacuna pueden aprender acerca del programa y cmo presentar una reclamacin llamando al: 1-800-338-2382 o visitando el sitio Web de VICP enwww.hrsa.gov/vaccinecompensation CMO PUEDO OBTENER MS INFORMACIN?  Consulte a su doctor. Le pueden dar el folleto de informacin que viene con la vacuna o sugerirle otras fuentes de informacin.  Llame al departamento de salud local o estatal.  Comunquese con los Centros para el Control y la Prevencin de Enfermedades (CDC):  Llame al 1-800-232-4636 (1-800-CDC-INFO) o  Visite el sitio Web de los CDC en www.cdc.gov/flu CDC Inactivated Influenza Vaccine-Spanish VIS (12/22/10) Document Released: 09/04/2008 Document Revised: 08/31/2011 [employer] Patient Information 2013 [employer].  

## 2012-06-07 ENCOUNTER — Encounter: Payer: Self-pay | Admitting: Gynecology

## 2012-06-07 ENCOUNTER — Ambulatory Visit (INDEPENDENT_AMBULATORY_CARE_PROVIDER_SITE_OTHER): Payer: BC Managed Care – PPO | Admitting: Gynecology

## 2012-06-07 VITALS — BP 118/74

## 2012-06-07 DIAGNOSIS — N949 Unspecified condition associated with female genital organs and menstrual cycle: Secondary | ICD-10-CM

## 2012-06-07 DIAGNOSIS — N912 Amenorrhea, unspecified: Secondary | ICD-10-CM

## 2012-06-07 DIAGNOSIS — R102 Pelvic and perineal pain: Secondary | ICD-10-CM

## 2012-06-07 LAB — URINALYSIS W MICROSCOPIC + REFLEX CULTURE
Casts: NONE SEEN
Glucose, UA: NEGATIVE mg/dL
Ketones, ur: NEGATIVE mg/dL
Leukocytes, UA: NEGATIVE
WBC, UA: NONE SEEN WBC/hpf (ref <3)
pH: 5.5 (ref 5.0–8.0)

## 2012-06-07 LAB — PREGNANCY, URINE: Preg Test, Ur: NEGATIVE

## 2012-06-07 MED ORDER — IBUPROFEN 800 MG PO TABS: 800.0000 mg | ORAL_TABLET | Freq: Three times a day (TID) | ORAL | Status: DC | PRN

## 2012-06-07 MED ORDER — MEDROXYPROGESTERONE ACETATE 10 MG PO TABS: ORAL_TABLET | ORAL | Status: DC

## 2012-06-07 NOTE — Progress Notes (Signed)
Patient presented to the office today stating that she's been having on and off right lower quadrant discomfort for the past week and a half. Patient denies any dyspareunia or vaginal discharge. She denies any fever chills nausea vomiting or appetite change. She rates the discomfort as 6/10 when it when it occurs. Review of her record indicated September 2013 she underwent laparoscopic excision of a right cystadenoma (benign) and aspiration of a left simple ovarian cyst.  Patient also has a history in the past of hyperprolactinemia and has been on Parlodel 2.5 mg 1 by mouth twice a day. Patient with normal prolactin level drawn 03/08/2012 and a normal TSH in may of this year. She stated that her last menstrual period was October 29. She has not been using any form of contraception but says that she has been cautious.  Urinalysis today was essentially negative. Her urine pregnancy test was negative today as well.  Exam: Abdomen soft nontender no rebound or guarding, negative Rovsing, negative obturator, and negative heel tap sign. Back: No CVA tenderness Pelvic: Bartholin urethra Skene was within normal limits Vagina: No lesions or discharge Cervix: No lesions or discharge Uterus: Anteverted normal size shape and consistency no cervical motion tenderness Adnexa: No palpable masses or tenderness Rectal exam: Unremarkable nontender  Assessment/plan: Pelvic exam somewhat limited due to patient's abdominal girth. She will return back later this week for an ultrasound for better assessment of her pelvis. She is status post right ovarian cystectomy for a benign cystadenoma. We will check her TSH and prolactin since she has been amenorrheic. Prescription for Provera 10 mg to take 1 by mouth daily for 5-10 days to start her cycle if her TSH and prolactin are normal. Prescription for Motrin 800 mg to take 1 by mouth 3 times a day was provided.

## 2012-06-09 ENCOUNTER — Ambulatory Visit (INDEPENDENT_AMBULATORY_CARE_PROVIDER_SITE_OTHER): Payer: BC Managed Care – PPO | Admitting: Gynecology

## 2012-06-09 ENCOUNTER — Encounter: Payer: Self-pay | Admitting: Gynecology

## 2012-06-09 ENCOUNTER — Ambulatory Visit (INDEPENDENT_AMBULATORY_CARE_PROVIDER_SITE_OTHER): Payer: BC Managed Care – PPO

## 2012-06-09 VITALS — BP 130/80

## 2012-06-09 DIAGNOSIS — R102 Pelvic and perineal pain: Secondary | ICD-10-CM

## 2012-06-09 DIAGNOSIS — N949 Unspecified condition associated with female genital organs and menstrual cycle: Secondary | ICD-10-CM

## 2012-06-09 DIAGNOSIS — N915 Oligomenorrhea, unspecified: Secondary | ICD-10-CM

## 2012-06-09 DIAGNOSIS — N912 Amenorrhea, unspecified: Secondary | ICD-10-CM

## 2012-06-09 DIAGNOSIS — R1900 Intra-abdominal and pelvic swelling, mass and lump, unspecified site: Secondary | ICD-10-CM

## 2012-06-09 MED ORDER — NORETHIN ACE-ETH ESTRAD-FE 1-20 MG-MCG PO TABS: ORAL_TABLET | ORAL | Status: DC

## 2012-06-09 NOTE — Patient Instructions (Addendum)
Quiste ovrico (Ovarian Cyst) Los ovarios son pequeos rganos que se encuentran a cada lado del tero. Los ovarios son los rganos que producen las hormonas femeninas, estrgeno y progesterona. Un quiste en el ovario es una bolsa llena de lquido que puede variar en tamao. Es normal que se formen pequeos quistes en las mujeres en edad de procrear y que an tienen sus perodos menstruales. Este tipo de quiste se denomina quiste folicular que se transforma en un quiste ovulatorio (quiste del cuerpo lteo) despus de producir los vulos. Si la mujer no queda embarazada, desaparece sin ninguna intervencin. Existen otros tipos de quistes de ovario que pueden causar problemas y necesitan ser tratados. El problema ms grave es que el quiste sea canceroso. Debe advertirse que en las mujeres menopusicas que presentan un quiste de ovario, existe un mayor riesgo de que ese quiste sea canceroso. Deben evaluarse muy rpida y cuidadosamente, y controlarse adecuadamente. Esto es ms importante en las mujeres menopusicas debido al elevado porcentaje de cncer de ovario durante este perodo. CAUSAS Y TIPOS DE CNCER DE OVARIO:  QUISTE FUNCIONAL: El quiste de folculo o cuerpo lteo es un quiste funcional que aparece todos los meses durante la ovulacin, con el ciclo menstrual. Si la mujer no queda embarazada, desaparecen con el prximo ciclo menstrual. Generalmente los quistes funcionales no presentan sntomas.  ENDOMETRIOMA: este quiste aparece en la superficie del tejido del tero. Un quiste se forma en el interior o sobre el ovario. Cada mes se desarrolla un poco ms debido a la sangre del perodo menstrual. Tambin se denomina "quiste de chocolate" debido a que est lleno de sangre que se vuelve color marrn. Este tipo de quiste causa dolor en la zona inferior del abdomen durante las relaciones sexuales y durante el perodo menstrual.  CISTADENOMA: Se desarrolla a partir de las clulas externas del ovario.  Generalmente no son cancerosos. Pueden llegar a ser de gran tamao y causar dolor en la zona baja del abdomen y durante las relaciones sexuales. Este tipo de quiste puede retorcerse e interrumpir el flujo de sangre, lo que causa un dolor muy intenso. Tambin puede romperse y causar mucho dolor.  QUISTE DERMOIDE: generalmente este tipo de quiste aparece en ambos ovarios. Puede haber diferentes tipos de tejidos en el quiste. Por ejemplo tejidos de piel, dientes, pelos o cartlago. En general no dan sntomas, excepto que sean muy grandes. Los quistes dermoides rara vez son cancerosos.  OVARIO POLIQUSTICO: es una enfermedad rara relacionada con trastornos hormonales que produce muchos quistes pequeos en ambos ovarios. Estos quistes son similares a los quistes de folculo pero nunca producen vulos y se transforman en cuerpo lteo. Pueden causar aumento del peso corporal, infertilidad, acn, aumento del vello facial y corporal y falta de perodos menstruales o perodos anormales. Muchas mujeres que sufren este problema presentan diabetes tipo 2. La causa exacta de este problema es desconocida. Un ovario poliqustico rara vez es canceroso.  QUISTE OVRICO TECALUTESTICO Aparece cuando hay demasiada hormona (gonadotrofina corinica humana), la que sobreestimula al ovario para producir vulos. Se observan con frecuencia cuando el mdico estimula los ovarios para la fertilizacin in vitro (bebs de probeta).  QUISTE LUTENICO: Aparece durante el embarazo. En algunos casos raros, produce una obstruccin del canal de parto. Generalmente desaparece despus del parto. SNTOMAS  Dolor o molestias en la pelvis.  Dolor durante las relaciones sexuales.  Aumento de la inflamacin en el abdomen.  Perodos menstruales anormales.  Aumento del dolor en los perodos menstruales.  Deja de menstruar   y no est embarazada. DIAGNSTICO El diagnstico puede realizarse durante:  Los exmenes plvicos anuales o de  rutina (frecuente).  Ecografas  Radiografas de la pelvis.  Tomografa computada  Resonancia magntica..  Anlisis de sangre. TRATAMIENTO  El tratamiento slo consiste en que el mdico controle el quiste mensualmente, durante 2  3 meses. Muchos desaparecen espontnemente, especialmente los quistes funcionales.  Puede aspirarse (secarse) con una aguja larga observndolo en una ecografa, o por laparoscopa (insertando un tubo en la pelvis a travs de una pequea incisin).  El quiste puede extirparse con laparoscopa.  En algunos casos es necesario extirparlo a travs de una incisin en la zona inferior del abdomen.  El tratamiento hormonal se utiliza para disolver ciertos tipos de quiste.  Las pldoras anticonceptivas pueden utilizarse para disolver otros tipos. INSTRUCCIONES PARA EL CUIDADO DOMICILIARIO Siga las indicaciones del profesional con respecto a:  Medicamentos  Visitas de control para evaluar y tratar el quiste.  Puede ser necesario que tenga que volver o concertar una cita con otro profesional para descubrir la causa exacta del quiste, si su mdico no es gineclogo.  Realice un examen plvico y un Papanicolau todos los aos, segn las indicaciones.  Informe al mdico si tubo un quiste de ovario en el pasado. SOLICITE ATENCIN MDICA SI:  Los perodos se atrasan, son irregulares, le faltan o son dolorosos.  El dolor abdominal (en el vientre) o en la pelvis persisten.  El abdomen se agranda o se hincha.  Siente una opresin en la vejiga o tiene problemas para vaciarla completamente.  Tiene dolor durante las relaciones sexuales.  Tiene la sensacin de hinchazn, presin o molestias en el abdomen.  Pierde peso sin razn aparente.  Siente un malestar generalizado.  Est constipada.  Pierde el apetito.  Aparece acn.  Aumenta el vello facial y corporal.  Aumenta de peso sin hacer modificaciones en su actividad fsica y en su dieta  habitual.  Sospecha que est embarazada. SOLICITE ATENCIN MDICA DE INMEDIATO SI:  Siente dolor abdominal cada vez ms intenso.  Si tiene ganas de vomitar (nuseas).  Le sube repentinamente la fiebre.  Siente dolor abdominal al mover el intestino.  Sus perodos menstruales son ms abundantes que lo habitual. Document Released: 03/18/2005 Document Revised: 08/31/2011 [employer] Patient Information 2013 [employer].  

## 2012-06-09 NOTE — Progress Notes (Signed)
Patient presented to the office today for followup ultrasound. See detail office note from December 18. Patient with past of hyperprolactinemia and has been on Parlodel 2.5 mg 1 by mouth twice a day. Patient with normal prolactin level drawn 03/08/2012 and a normal TSH in may of this year. She stated that her last menstrual period was October 29. Patient recently had her use TSH and prolactin drawn which was normal. She was prescribed Provera 10 mg to take 1 by mouth daily for 5-10 days to start her menses. She was also here for an ultrasound today. Ultrasound today demonstrated the following:  Uterus measures 7.9 x 5.1 x 3.2 cm with endometrial stripe of 9.7 mm. A possible endometrial polyp measuring 18 x 10 mm was reported. Left ovary normal. 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 No free fluid in the cul-de-sac.  CA 125 was ordered today. Patient aware of its limitations.  Assessment/plan: Patient with secondary amenorrhea. History of hyperprolactinemia recent prolactin level normal range as was her TSH. Patient started Provera 2 days ago she will continue until she withdrawals. She will be placed on Junel 1/20 that she will take continuously and withdrawal in 3 months and then she will return back to the office for followup ultrasound for this single right ovarian cyst. Will notify her there is any abnormality on the CA 125. 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

## 2012-06-10 LAB — CA 125: CA 125: 10.8 U/mL (ref 0.0–30.2)

## 2012-08-12 IMAGING — CT CT HEAD W/O CM
2 series · 16 of 30 positions shown, 20 images · non-contrast
Comparison: None.

CLINICAL DATA: Headache

CT HEAD WITHOUT CONTRAST
TECHNIQUE: Contiguous axial images were obtained from the base of
the skull through the vertex without contrast.

[Series 2: head w/o · axial · non-contrast · 0.42mm/px · z∈[-217,-72]mm · 13 of 35 slices shown, 17 images]
[im 3/35  brain]
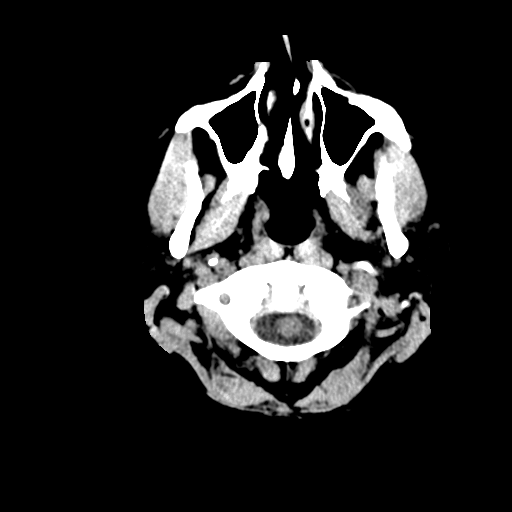
[im 3/35  bone]
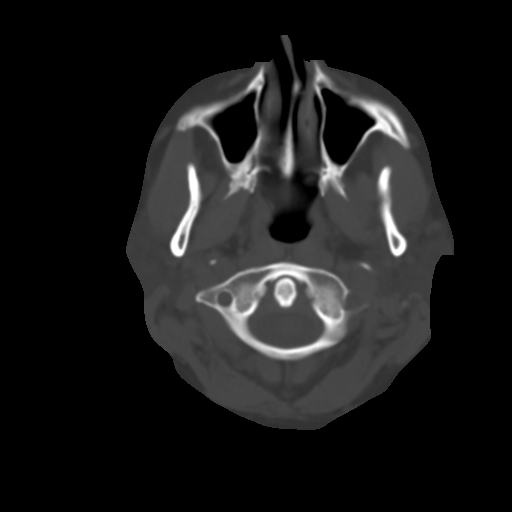
[im 5/35  brain]
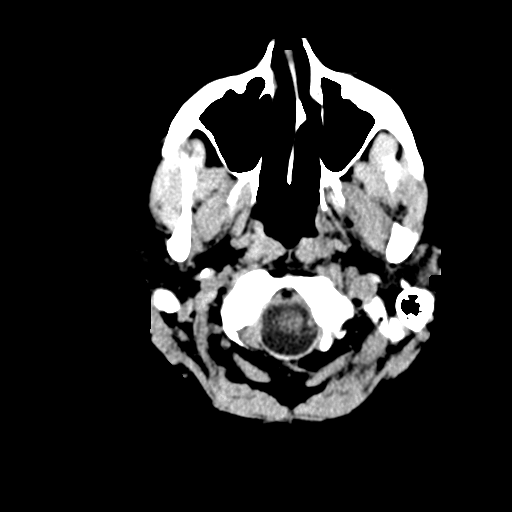
[im 8/35  brain]
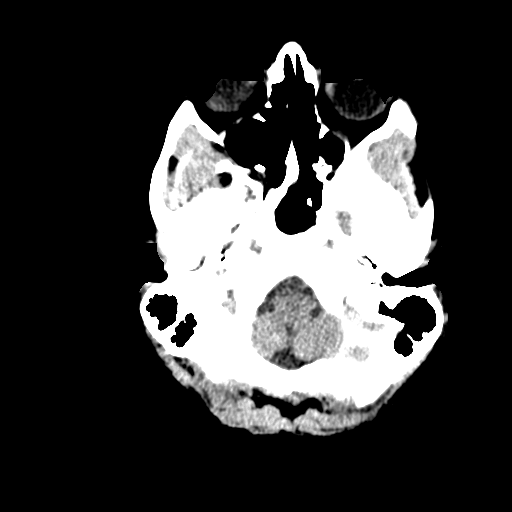
[im 10/35  brain]
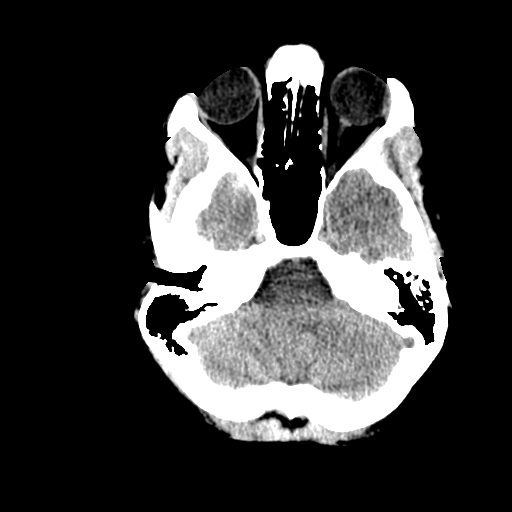
[im 13/35  brain]
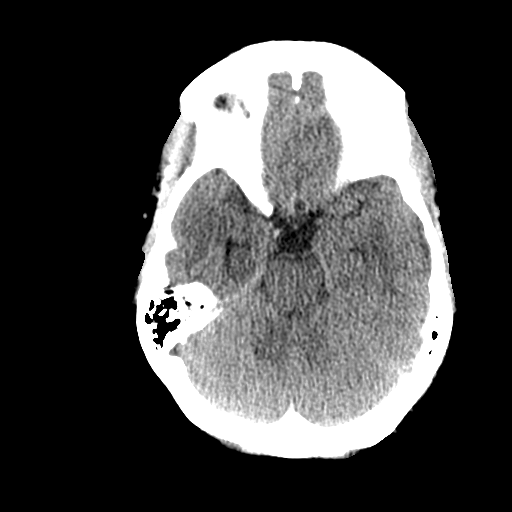
[im 13/35  bone]
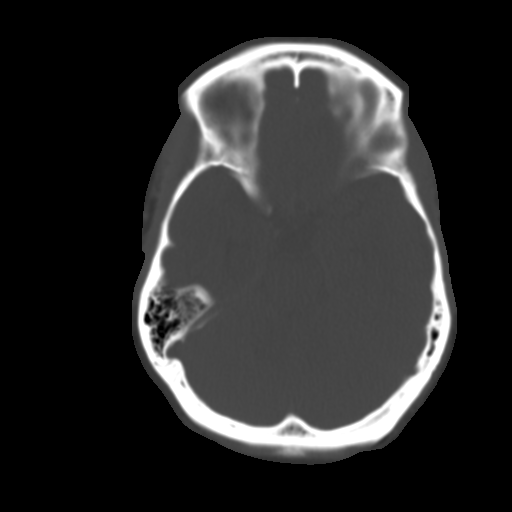
[im 15/35  brain]
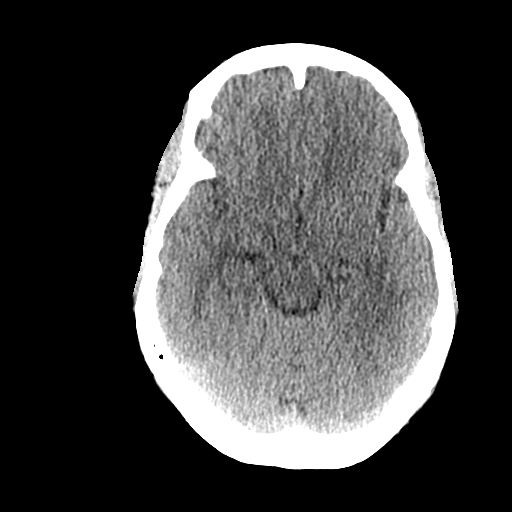
[im 18/35  brain]
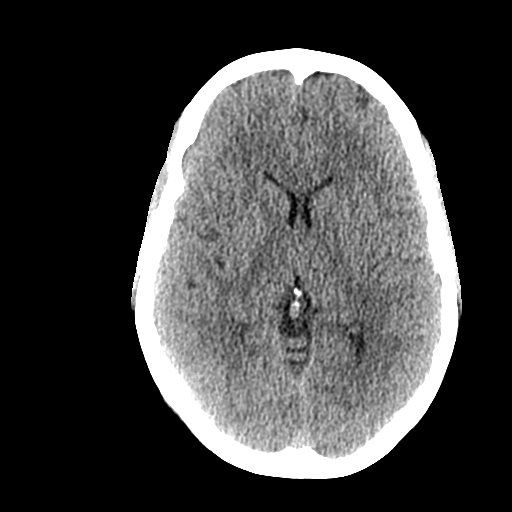
[im 20/35  brain]
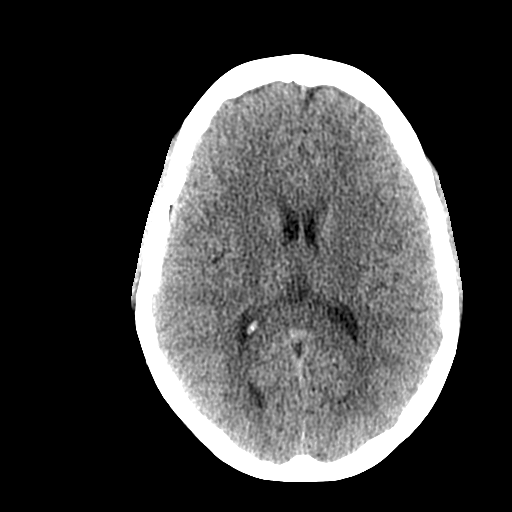
[im 22/35  brain]
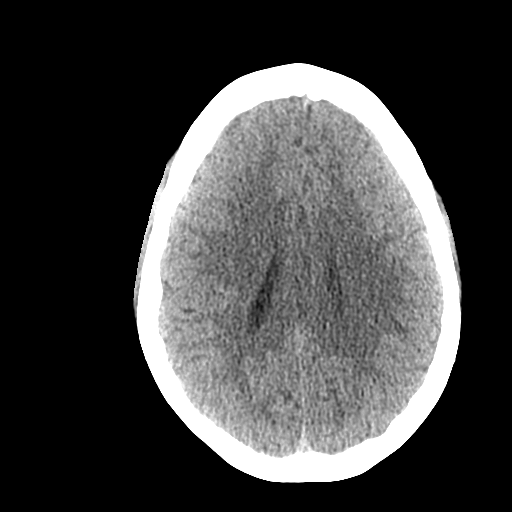
[im 22/35  bone]
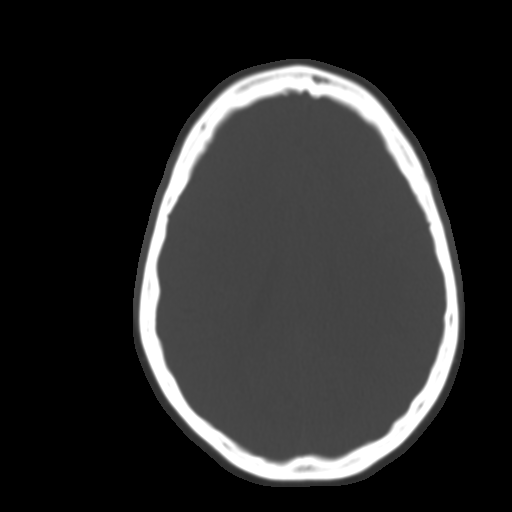
[im 25/35  brain]
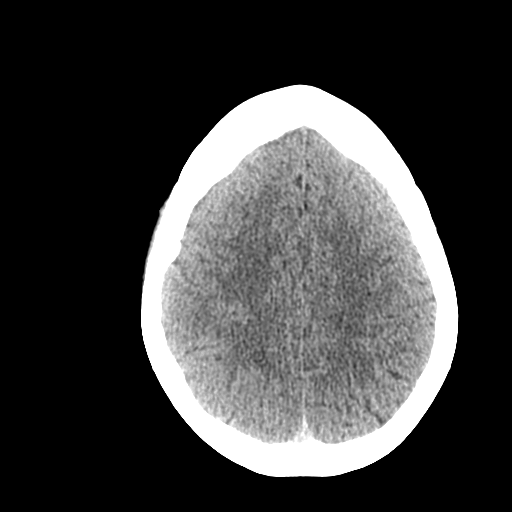
[im 27/35  brain]
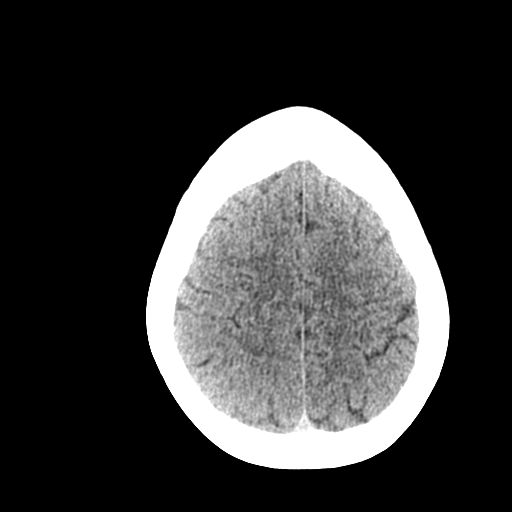
[im 30/35  brain]
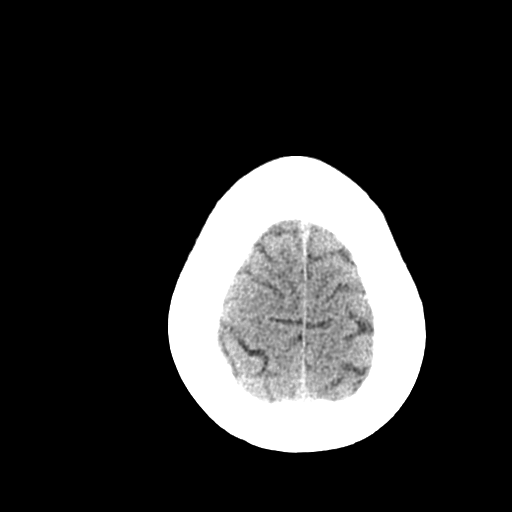
[im 32/35  brain]
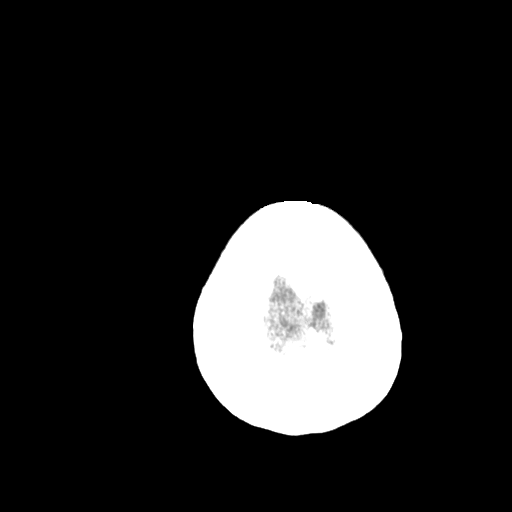
[im 32/35  bone]
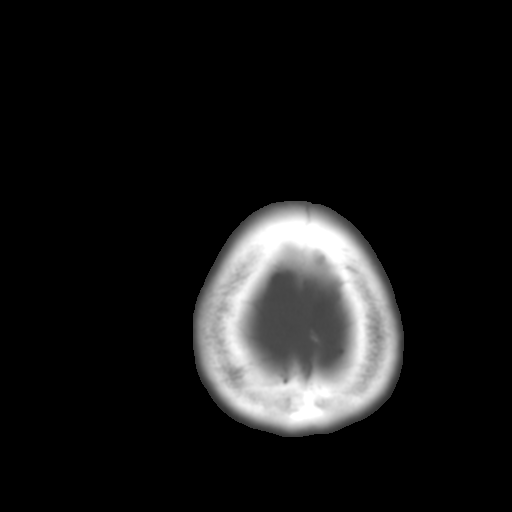

[Series 3: bone windows · axial · 0.42mm/px · z∈[-217,-167]mm · 3 of 35 slices shown]
[im 3/35  bone]
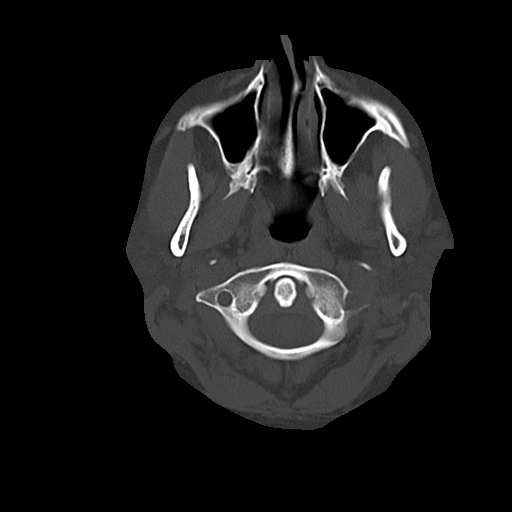
[im 8/35  bone]
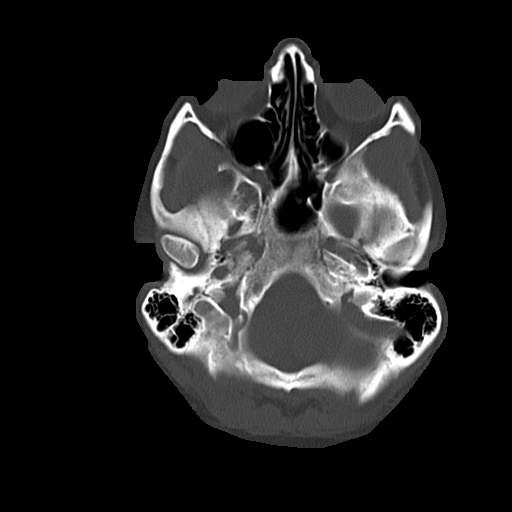
[im 13/35  bone]
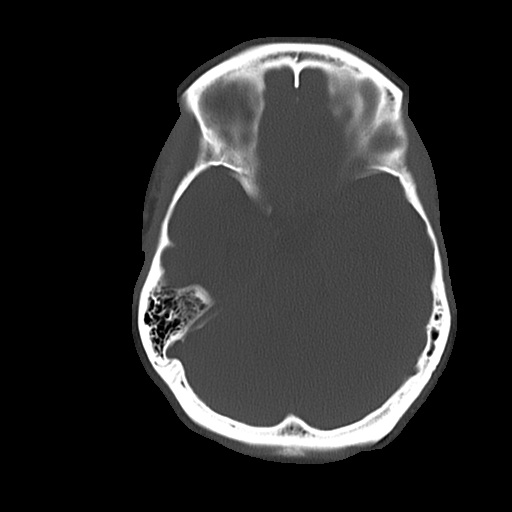

[16 of 30 positions shown; findings below may reference images not displayed]

FINDINGS: No acute hemorrhage, acute infarction, or mass lesion is
identified.  No midline shift.  No ventriculomegaly.  No skull
fracture.
IMPRESSION: Normal exam.

## 2012-11-02 ENCOUNTER — Ambulatory Visit (INDEPENDENT_AMBULATORY_CARE_PROVIDER_SITE_OTHER): Payer: BC Managed Care – PPO | Admitting: Gynecology

## 2012-11-02 ENCOUNTER — Telehealth: Payer: Self-pay | Admitting: *Deleted

## 2012-11-02 ENCOUNTER — Encounter: Payer: Self-pay | Admitting: Gynecology

## 2012-11-02 VITALS — BP 126/78 | Ht 64.0 in | Wt 198.0 lb

## 2012-11-02 DIAGNOSIS — N83201 Unspecified ovarian cyst, right side: Secondary | ICD-10-CM | POA: Insufficient documentation

## 2012-11-02 DIAGNOSIS — Z23 Encounter for immunization: Secondary | ICD-10-CM

## 2012-11-02 DIAGNOSIS — N912 Amenorrhea, unspecified: Secondary | ICD-10-CM

## 2012-11-02 DIAGNOSIS — N83209 Unspecified ovarian cyst, unspecified side: Secondary | ICD-10-CM

## 2012-11-02 DIAGNOSIS — Z01419 Encounter for gynecological examination (general) (routine) without abnormal findings: Secondary | ICD-10-CM

## 2012-11-02 DIAGNOSIS — E229 Hyperfunction of pituitary gland, unspecified: Secondary | ICD-10-CM

## 2012-11-02 LAB — CBC WITH DIFFERENTIAL/PLATELET
Eosinophils Absolute: 0.1 10*3/uL (ref 0.0–0.7)
Eosinophils Relative: 1 % (ref 0–5)
HCT: 35.7 % — ABNORMAL LOW (ref 36.0–46.0)
Lymphocytes Relative: 27 % (ref 12–46)
Lymphs Abs: 2.1 10*3/uL (ref 0.7–4.0)
MCH: 30.6 pg (ref 26.0–34.0)
MCV: 89.5 fL (ref 78.0–100.0)
Monocytes Absolute: 0.6 10*3/uL (ref 0.1–1.0)
Monocytes Relative: 8 % (ref 3–12)
Platelets: 284 10*3/uL (ref 150–400)
RBC: 3.99 MIL/uL (ref 3.87–5.11)

## 2012-11-02 LAB — PREGNANCY, URINE: Preg Test, Ur: NEGATIVE

## 2012-11-02 MED ORDER — NORETHIN ACE-ETH ESTRAD-FE 1-20 MG-MCG PO TABS: ORAL_TABLET | ORAL | Status: DC

## 2012-11-02 MED ORDER — MEDROXYPROGESTERONE ACETATE 10 MG PO TABS: ORAL_TABLET | ORAL | Status: DC

## 2012-11-02 NOTE — Telephone Encounter (Signed)
Please tell the pharmacy that I just need the Junel 1/20 because she will be taking it continuously and withdrawal every 3 months. Please prescribe three-month supply with 4 refills.

## 2012-11-02 NOTE — Progress Notes (Signed)
Kristie Hill [birth year] [id number]   History:    42 y.o.  for annual gyn exam who stated that she skipped the month of April had no menstrual cycle and has not had one as of yet. Patient with long-standing history of oligomenorrhea. She also has history of hyperprolactinemia where she is currently on Parlodel 2.5 mg twice a day. Patient was seen in December of 2013 and was found to have an ultrasound a right ovarian echo-free thinwall avascular cyst measuring 4.2 x 3.6 x 4.5 cm with a single septum. She had a normal CA 125 as well as TSH and prolactin. She was given Provera to withdrawal and start the Junel 1/20 oral contraceptive pill continuously and withdrawal every 3 months. She took it for the first 3 months and forgot to take it afterwards. She states she's been using condoms for contraception. Urine pregnancy test today was negative. 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 Patient currently not interested in conceiving  Patient denies any prior history of abnormal Pap smears. Her mammogram was in June of last year demonstrated it was dense breasts. She has not received a Tdap vaccine as of yet. Past medical history,surgical history, family history and social history were all reviewed and documented in the EPIC chart.  Gynecologic History Patient's last menstrual period was 09/04/2012. Contraception: condom Last Pap: 2013. Results were: normal Last mammogram: 2013. Results were: normal but dense breasts  Obstetric History OB History   Grav Para Term Preterm Abortions TAB SAB Ect Mult Living   5    5  5    0     # Outc Date GA Lbr Len/2nd Wgt Sex Del Anes PTL Lv   1 SAB            2 SAB            3 SAB            4 SAB            5 SAB                ROS: A ROS was performed and pertinent positives and negatives are  included in the history.  GENERAL: No fevers or chills. HEENT: No change in vision, no earache, sore throat or sinus congestion. NECK: No pain or stiffness. CARDIOVASCULAR: No chest pain or pressure. No palpitations. PULMONARY: No shortness of breath, cough or wheeze. GASTROINTESTINAL: No abdominal pain, nausea, vomiting or diarrhea, melena or bright red blood per rectum. GENITOURINARY: No urinary frequency, urgency, hesitancy or dysuria. MUSCULOSKELETAL: No joint or muscle pain, no back pain, no recent trauma. DERMATOLOGIC: No rash, no itching, no lesions. ENDOCRINE: No polyuria, polydipsia, no heat or cold intolerance. No recent change in weight. HEMATOLOGICAL: No anemia or easy bruising or bleeding. NEUROLOGIC: No headache, seizures, numbness, tingling or weakness. PSYCHIATRIC: No depression, no loss of interest in normal activity or change in sleep pattern.     Exam: chaperone present  BP 126/78  Ht 5\' 4"  (1.626 m)  Wt 198 lb (89.812 kg)  BMI 33.97 kg/m2  LMP 09/04/2012  Body mass index is 33.97 kg/(m^2).  General appearance : Well developed well nourished female. No acute distress HEENT: Neck supple, trachea midline, no carotid bruits, no thyroidmegaly Lungs: Clear to auscultation, no rhonchi or wheezes, or rib retractions  Heart: Regular rate and rhythm, no murmurs  or gallops Breast:Examined in sitting and supine position were symmetrical in appearance, no palpable masses or tenderness,  no skin retraction, no nipple inversion, no nipple discharge, no skin discoloration, no axillary or supraclavicular lymphadenopathy Abdomen: no palpable masses or tenderness, no rebound or guarding Extremities: no edema or skin discoloration or tenderness  Pelvic:  Bartholin, Urethra, Skene Glands: Within normal limits             Vagina: No gross lesionsbrown old blood discharge Cervix: No gross lesions or discharge  Uterus  anteverted, normal size, shape and consistency, non-tender and  mobile  Adnexa  Without masses or tenderness  Anus and perineum  normal   Rectovaginal  normal sphincter tone without palpated masses or tenderness             Hemoccult not indicated     Assessment/Plan:  42 y.o. female for annual exam with history of oligomenorrhea and hyperprolactinemia. Patient did not follow instructions correctly to continue on the oral contraceptive pills and withdrawal every 4 months. Instructions were provided once again in Spanish. The following labs will be ordered today: CBC, hemoglobin A1c, screening cholesterol,prolactin and TSH. Patient was scheduled appointment for next week for an ultrasound for followup on the ovarian cyst. She will take Provera 10 mg for the next 5-10 days to withdrawal and then begin the Junel 1/20 oral contraceptive pill continuously and withdrawal every 3 months as instructed. She will continue her Parlodel 2.5 mg by mouth twice a day until we see her for the ultrasound to see what the results of this prolactin level is. Once again we discussed importance of exercise and healthy nutrition. She did receive the Tdap vaccine today. No Pap smear done today the new screening guidelines discussed.    Ok Edwards MD, 5:01 PM 11/02/2012

## 2012-11-02 NOTE — Patient Instructions (Addendum)
Vacuna difteria/ttanos (Td) o Vacuna difteria, ttanos, tos convulsa (Tdap), Lo que debe saber (Tetanus, Diphtheria [Td] or Tetanus, Diphtheria, Pertussis [Tdap] Vaccine, What You Need to Know) PORQU VACUNARSE? El ttanos , la difteria y la tos ferina pueden ser enfermedades graves.  El TTANOS  (trismo) provoca la contraccin dolorosa y rigidez de los msculos, por lo general, en todo el cuerpo.   Puede causar la contraccin de los msculos de la cabeza y el cuello de modo que el enfermo no puede abrir la boca ni tragar., y en algunos casos, tampoco puede respirar.. El ttanos causa la muerte de 1 de cada 5 personas que se infectan. LA DIFTERIA produce la formacin de una membrana gruesa que cubre el fondo de la garganta.  Puede causar problemas respiratorios, parlisis, insuficiencia cardaca, e incluso la muerte. El PERTUSIS (tos ferina) causa ataques de tos intensa que pueden dificultar la respiracin, provocar vmitos e interrumpir el sueo.   Puede causar prdida de peso, incontinencia, fractura de costillas, y desmayos por la intensa tos. Hasta de 2 de cada 100 adolescentes y 5 de cada 100 adultos que enferman de tos ferina deben ser hospitalizados o tienen complicaciones como la neumona y la muerte. Estas 3 enfermedades son provocadas por bacterias. La difteria y la tos ferina se contagian de persona a persona. El ttanos ingresa al organismo a travs de cortes, rasguos o heridas. En los [place] ocurran alrededor de 200 000 casos por ao de difteria y tos ferina, antes de que existieran las vacunas, y tambin ocurran cientos de casos de ttanos. Desde la aparicin de las vacunas, el ttanos y la difteria han disminuido en alrededor del 99% y los casos de tos ferina disminuyeron aproximadamente el 92%.  Los nios menores de 6 aos deben recibir la vacuna DTaP para estar protegidos contra estas tres enfermedades. Pero los nios mayores, los adolescentes y los adultos tambin  necesitan proteccin. VACUNAS PARA ADOLESCENTES Y ADULTOS Vacunas Tdap y Td  Hay dos vacunas disponibles para proteger de estas enfermedades a nios a partir de los 7aos:   La vacuna Td fue utilizada durante muchos aos. Protege contra el ttanos y la difteria.  La vacuna Tdap fue autorizada en 2005. Es la primera vacuna para adolescentes y adultos que protege contra la tos ferina y el ttanos y la difteria. Una dosis de refuerzo de la Td se recomienda cada 10 aos. La Tdap se aplica slo una vez.  QU VACUNA DEBO APLICARME Y CUANDO? Las edades de 7 a 18 aos  Entre los 11 y los 12 aos se recomienda una dosis de Tdap. Esta dosis puede aplicarse desde los 7 aos en los nios que no han recibido una o ms dosis de DTaP anteriormente.  Los nios y adolescentes que no recibieron todas las dosis programadas de DTaP o DTP a los 7 aos deben completar la serie usando una combinacin de Td y Tdap. Adultos de 19 aos o ms  Todos los adultos deben recibir una dosis de refuerzo de Td cada 10 aos. Los adultos de menos de 65 aos que nunca hayan recibido la Tdap deben reemplazarla por la siguiente dosis de refuerzo. Los adultos a partir de los 65 aos puedenrecibir una dosis de Tdap.  Los adultos (incluyendo las mujeres que podran quedar embarazadas y los adultos mayores de 65 aos) que tienen contacto cercano con un beb menor de 12 meses deben aplicarse una dosis de Tdap para proteger al beb de la tos ferina.  Los trabajadores de   la salud que tengan contacto directo con pacientes en hospitales o clnicas deben recibir una dosis de Tdap. Proteccin despus de una herida  Es posible que una persona que tenga un corte o quemadura grave necesite una dosis de Td o Tdap para prevenir la infeccin por ttanos. Puede usarse la Tdap en personas que nunca recibieron una dosis. Pero debe usarse la Td, si la Tdap no se encuentra disponible, o para:  Cualquier persona que haya recibido una dosis de  Tdap.  Los nios entre los 7 y los 9 aos que han completado las series de DTap anteriormente.  Adultos de 65 aos o ms. Mujeres embarazadas.   Las mujeres embarazadas que nunca recibieron una dosis de Ddap deben recibirla despus de la 20a semana de gestacin y preferiblemente durante el 3er. trimestre. Si no se aplican la Tdap durante el embarazo, deben recibirla lo antes posible despus del parto. Las mujeres embarazadas que han recibido la Tdap y tienen que aplicarse la vacuna contra el ttanos o la difteria durante el embarazo, deben recibir la Td. Las vacunas Tdap y Td pueden ser administradas al mismo tiempo que otras vacunas. ALGUNAS PERSONAS NO DEBEN RECIBIR LA VACUNA O DEBEN ESPERAR  Las personas que hayan tenido una reaccin alrgica que haya puesto en peligro su vida despus de una dosis de vacuna contra el ttanos, la difteria o la tos ferina no deben recibir Td ni Tdap..  Las personas que tengan alergias graves a algn componente de una vacuna no deben recibir esa vacuna. Informe a su mdico si la persona que recibe la vacuna sufre alergias graves.  Cualquier persona que haya estado en coma o que haya tenido convulsiones dentro de los 7 das posteriores despus de una dosis de DTP o DTaP no debe recibir la Tdap, salvo que se encuentre una causa que no fuera la vacuna. Estas personas pueden recibir Td.  Consulte a su mdico si la persona que recibe alguna de las vacunas:  Tiene epilepsia o algn otro problema del sistema nervioso.  Tuvo inflamacin o dolor intenso despus de una dosis de DTP, DTaP, DT, Td, o Tdap.  Ha tenido el sndrome de Guillain Barr (GBS por sus siglas en ingls). Las personas que sufran una enfermedad moderada o grave el da en que se programa la vacuna, deben esperar a recuperarse para recibir las vacunas Tdap o Td. Por lo general, una persona con una enfermedad leve o fiebre baja puede recibir la vacuna. CULES SON LOS RIESGOS DE LAS VACUNAS TDAP Y  TD? Con una vacuna, al igual que con cualquier medicamento, siempre existe un pequeo riesgo de una reaccin alrgica que ponga en peligro la vida o cause otro problema grave. Todo procedimiento mdico, inclusive la vacunacin pueden causar breves episodios de lipotimia o sntomas relacionados (como movimientos espasmdicos). Para evitar los desmayos y las lesiones causadas por las cadas, permanezca sentado o recustese durante los 15 minutos posteriores a la vacunacin. Informe a su mdico si el paciente se siente dbil o mareado, tiene cambios en la visin o siente zumbidos en los odos.  Es mucho ms probable que tener ttanos, difteria, o tos ferina cause problemas ms graves que los provocados por recibir cualquiera de las vacunas Td o Tdap. A continuacin se enumeran los problemas informados despus de las vacunas Td y Tdap. Problemas Leves (perceptibles, pero que no interfirieron con las actividades): Tdap  Dolor (alrededor de 3 de cada 4 adolescentes y 2 de cada 3 adultos).  Enrojecimiento o inflamacin en   el sitio de la inyeccin (alrededor de 1 de cada 5).  Fiebre leve de al menos 100.4 F (38 C) (hasta alrededor de 1 cada 25 adolescentes y 1 de cada 100 adultos).  Dolor de cabeza (alrededor de 4 de cada 10 adolescentes y 3 de cada 10 adultos).  Cansancio (alrededor de 1 de cada 3 adolescentes y 1 de cada 4 adultos).  Nuseas, vmitos, diarrea, o dolor de estmago (hasta 1 de cada 4 adolescentes y 1 de cada 10 adultos).  Escalofros, dolores corporales, dolor articular, erupciones, o inflamacin de las glndulas (poco frecuente). Td  Dolor (hasta alrededor de 8 de cada 10).  Enrojecimiento o inflamacin de la inyeccin (alrededor de 1 de cada 3).  Fiebre leve (hasta alrededor de 1 de cada 5).  Dolor de cabeza o cansancio (poco frecuente). Problemas Moderados (interfieren con las actividades, pero no requieren atencin mdica): Tdap  Dolor en el sitio de la inyeccin  (alrededor de 1 de cada 20 adolescentes y 1 de cada 100 adultos).  Enrojecimiento o inflamacin de la inyeccin (alrededor de 1 de cada 16 adolescentes y 1 de cada 25 adultos).  Fiebre de ms de 102 F (38.9 C) (alrededor de 1 de cada 100 adolescentes y 1 de cada 250 adultos).  Dolor de cabeza (1 de cada 300).  Nuseas, vmitos, diarrea, o dolor de estmago (hasta 3 de cada 100 adolescentes y 1 de cada 100 adultos). Td  Fiebre de ms de 102 F (38.9 C) (poco comn). Tdap o Td  Inflamacin de gran extensin en el brazo en el que se aplic la vacuna (hasta 3 de cada 100). Problemas Graves (no puede realizar actividades habituales; requiere atencin mdica) Tdap o Td  Inflamacin, dolor intenso, sangrado y enrojecimiento en el brazo, en el sitio de la inyeccin (poco frecuente). Puede producirse una reaccin alrgica grave despus de cualquier vacuna. Se estima que estas reacciones ocurren en menos de una de cada un milln de dosis. QU PASA SI HAY UNA REACCIN GRAVE? Qu signos debo buscar? Cualquier estado poco habitual, como una reaccin alrgica grave o fiebre alta. Si le produce una reaccin alrgica grave, se manifestar dentro de algunos minutos a una hora despus de recibir la vacuna. Entre los signos de reaccin alrgica grave se encuentran la dificultad para respirar, debilidad, ronquera o sibilancias, latidos cardacos acelerados, urticaria, mareos, palidez, o inflamacin de la garganta. Qu debo hacer?  Comunquese con su mdico o lleve inmediatamente a la persona al mdico.  Dgale a su mdico qu ocurri, la fecha y hora en que sucedi y cundo le aplicaron la vacuna.  Pida a su mdico que informe sobre la reaccin llenando un formulario del Sistema de Informacin de Reacciones Adversos a las Vacunas (VAERS, por sus siglas en ingls). O, puede presentar este informe a travs del sitio web de VAERS enwww.vaers.hhs.gov o puede llamar al 1-800-822-7967. VAERS no brinda  asistencia mdica. PROGRAMA NACIONAL DE COMPENSACIN DE DAOS POR VACUNAS El Programa Nacional de Compensacin de Daos por Vacunas (VICP) fue creado en 1986.  Aquellas personas que consideren que han sufrido un dao como consecuencia de una vacuna y quieren saber ms acerca del programa y como presentar una denuncia, pueden llamar al 1-800-338-2382 o visitar su sitio web en www.hrsa.gov/vaccinecompensation  CMO PUEDO OBTENER MS INFORMACIN?  El profesional podr darle el prospecto de la vacuna o sugerirle otras fuentes de informacin.  Comunquese con el servicio de salud de su localidad o su estado.  Comunquese con los Centros para el   control y la prevencin de enfermedades (Centers for Disease Control and Prevention , CDC).  Llame al 1-800-232-4636 (1-800-CDC-INFO).  Visite los sitios web de CDC ubicados en www.cdc.gov/vaccines CDC Td and Tdap Interim VIS-Spanish (07/15/10) Document Released: 09/24/2008 Document Revised: 08/31/2011 [employer] Patient Information 2013 [employer].  

## 2012-11-02 NOTE — Telephone Encounter (Signed)
Pharmacy called regarding the Junel Fe 1/20 rx sent today for pt, pharmacy asked if you want the pills with iron in it? Because the direction states to take continuous and withdraw after third pack. If you want Junel 1/20 without iron continuous rx sent let me know.

## 2012-11-03 LAB — URINALYSIS W MICROSCOPIC + REFLEX CULTURE
Bilirubin Urine: NEGATIVE
Crystals: NONE SEEN
Glucose, UA: NEGATIVE mg/dL
Leukocytes, UA: NEGATIVE
Specific Gravity, Urine: 1.024 (ref 1.005–1.030)
pH: 5 (ref 5.0–8.0)

## 2012-11-03 MED ORDER — NORETHINDRONE ACET-ETHINYL EST 1-20 MG-MCG PO TABS: ORAL_TABLET | ORAL | Status: DC

## 2012-11-03 NOTE — Telephone Encounter (Signed)
New rx sent to pharmacy

## 2012-11-09 ENCOUNTER — Ambulatory Visit (INDEPENDENT_AMBULATORY_CARE_PROVIDER_SITE_OTHER): Payer: BC Managed Care – PPO | Admitting: Gynecology

## 2012-11-09 ENCOUNTER — Ambulatory Visit (INDEPENDENT_AMBULATORY_CARE_PROVIDER_SITE_OTHER): Payer: BC Managed Care – PPO

## 2012-11-09 ENCOUNTER — Encounter: Payer: Self-pay | Admitting: Gynecology

## 2012-11-09 DIAGNOSIS — N83201 Unspecified ovarian cyst, right side: Secondary | ICD-10-CM

## 2012-11-09 DIAGNOSIS — N915 Oligomenorrhea, unspecified: Secondary | ICD-10-CM

## 2012-11-09 DIAGNOSIS — N8300 Follicular cyst of ovary, unspecified side: Secondary | ICD-10-CM

## 2012-11-09 DIAGNOSIS — D259 Leiomyoma of uterus, unspecified: Secondary | ICD-10-CM

## 2012-11-09 DIAGNOSIS — N83209 Unspecified ovarian cyst, unspecified side: Secondary | ICD-10-CM

## 2012-11-09 DIAGNOSIS — E221 Hyperprolactinemia: Secondary | ICD-10-CM

## 2012-11-09 DIAGNOSIS — N831 Corpus luteum cyst of ovary, unspecified side: Secondary | ICD-10-CM

## 2012-11-09 DIAGNOSIS — E229 Hyperfunction of pituitary gland, unspecified: Secondary | ICD-10-CM

## 2012-11-09 MED ORDER — NORETHINDRONE ACET-ETHINYL EST 1-20 MG-MCG PO TABS: ORAL_TABLET | ORAL | Status: DC

## 2012-11-09 NOTE — Progress Notes (Signed)
Patient presented to the office today for discussion of her ultrasound. Patient was seen in the office early this month for annual exam and she had mentioned that she skipped the month of April had no menstrual cycle and has not had one as of yet. Patient with long-standing history of oligomenorrhea. She also has history of hyperprolactinemia where she is currently on Parlodel 2.5 mg twice a day. Patient was seen in December of 2013 and was found to have an ultrasound a right ovarian echo-free thinwall avascular cyst measuring 4.2 x 3.6 x 4.5 cm with a single septum. She had a normal CA 125 as well as TSH and prolactin. She was given Provera to withdrawal and start the Junel 1/20 oral contraceptive pill continuously and withdrawal every 3 months. She took it for the first 3 months and forgot to take it afterwards. She states she's been using condoms for contraception. Urine pregnancy test today was negative. 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 Patient currently not interested in conceiving  Patient had trouble with a pharmacy in obtaining her medication. Her cycle started on its own so she did not take the Provera. She's currently on the fourth day of her menstrual cycle. Her recent labs consisting of CBC, hemoglobin A1c, screening cholesterol, prolactin and TSH were all normal. She was asked to decrease her Parlodel from 2.5 mg twice a day to 1 tablet daily because her prolactin level was 1.7.  Ultrasound today: Uterus measured 8.4 x 5.5 x 3.2 cm with endometrial stripe of 2.2 mm. Patient with echogenic fibroid cystic lumen measuring 7 x 5 mm. Endometrial thin echogenic. Right ovary thickwalled cystic area with internal low level echo measuring 19 x 16 mm avascular decreased in size from December 2013. Left ovary with several  follicles.  Assessment/plan: Small uterine fibroid. Right ovarian cyst had decreased since December 2013 no measuring 1.9 x 1.6 mm. Patient with history of oligomenorrhea which was probably attributed to hyperprolactinemia and is now having normal cycles. An adjustment was made on her Parlodel today and she was instructed to take one 2.5 mg daily instead of twice a day and to return to the office in 3 months for followup prolactin level. Patient otherwise scheduled to return to the office for full annual exam in one year. All the above instructions were provided in Spanish.

## 2013-01-31 ENCOUNTER — Other Ambulatory Visit: Payer: BC Managed Care – PPO

## 2014-03-26 ENCOUNTER — Encounter: Payer: Self-pay | Admitting: Gynecology

## 2014-03-26 ENCOUNTER — Ambulatory Visit (INDEPENDENT_AMBULATORY_CARE_PROVIDER_SITE_OTHER): Payer: BC Managed Care – PPO | Admitting: Gynecology

## 2014-03-26 VITALS — BP 128/86 | Ht 64.0 in | Wt 195.0 lb

## 2014-03-26 DIAGNOSIS — Z23 Encounter for immunization: Secondary | ICD-10-CM

## 2014-03-26 DIAGNOSIS — Z8742 Personal history of other diseases of the female genital tract: Secondary | ICD-10-CM

## 2014-03-26 DIAGNOSIS — N898 Other specified noninflammatory disorders of vagina: Secondary | ICD-10-CM

## 2014-03-26 DIAGNOSIS — Z8639 Personal history of other endocrine, nutritional and metabolic disease: Secondary | ICD-10-CM

## 2014-03-26 DIAGNOSIS — Z01419 Encounter for gynecological examination (general) (routine) without abnormal findings: Secondary | ICD-10-CM

## 2014-03-26 LAB — WET PREP FOR TRICH, YEAST, CLUE
TRICH WET PREP: NONE SEEN
WBC WET PREP: NONE SEEN
YEAST WET PREP: NONE SEEN

## 2014-03-26 MED ORDER — TINIDAZOLE 500 MG PO TABS
ORAL_TABLET | ORAL | Status: DC
Start: 2014-03-26 — End: 2014-12-04

## 2014-03-26 MED ORDER — NORETHINDRONE ACET-ETHINYL EST 1-20 MG-MCG PO TABS: ORAL_TABLET | ORAL | Status: DC

## 2014-03-26 NOTE — Patient Instructions (Addendum)
Influenza Virus Vaccine injection (Fluarix) Qu es este medicamento? La VACUNA ANTIGRIPAL ayuda a disminuir el riesgo de contraer la influenza, tambin conocida como la gripe. La vacuna solo ayuda a protegerle contra algunas cepas de influenza. Esta vacuna no ayuda a reducir Catering manager de contraer influenza pandmica [id number]. Este medicamento puede ser utilizado para otros usos; si tiene alguna pregunta consulte con su proveedor de atencin mdica o con su farmacutico. MARCAS COMERCIALES DISPONIBLES: Fluarix, Fluzone Qu le debo informar a mi profesional de la salud antes de tomar este medicamento? Necesita saber si usted presenta alguno de los siguientes problemas o situaciones: -trastorno de sangrado como hemofilia -fiebre o infeccin -sndrome de Guillain-Barre u otros problemas neurolgicos -problemas del sistema inmunolgico -infeccin por el virus de la inmunodeficiencia humana (VIH) o SIDA -niveles bajos de plaquetas en la sangre -esclerosis mltiple -una Risk analyst o inusual a las vacunas antigripales, a los huevos, protenas de pollo, al ltex, a la gentamicina, a otros medicamentos, alimentos, colorantes o conservantes -si est embarazada o buscando quedar embarazada -si est amamantando a un beb Cmo debo utilizar este medicamento? Esta vacuna se administra mediante inyeccin por va intramuscular. Lo administra un profesional de [employer]	[place]. Recibir una copia de informacin escrita sobre la vacuna antes de cada vacuna. Asegrese de leer este folleto cada vez cuidadosamente. Este folleto puede cambiar con frecuencia. Hable con su pediatra para informarse acerca del uso de este medicamento en nios. Puede requerir atencin especial. Sobredosis: Pngase en contacto inmediatamente con un centro toxicolgico o una sala de urgencia si usted cree que haya tomado demasiado medicamento. ATENCIN: [employer] es solo para usted. No comparta este medicamento con nadie. Qu sucede  si me olvido de una dosis? No se aplica en este caso. Qu puede interactuar con este medicamento? -quimioterapia o radioterapia -medicamentos que suprimen el sistema inmunolgico, tales como etanercept, anakinra, infliximab y adalimumab -medicamentos que tratan o previenen cogulos sanguneos, como warfarina -fenitona -medicamentos esteroideos, como la prednisona o la cortisona -teofilina -vacunas Puede ser que esta lista no menciona todas las posibles interacciones. Informe a su profesional de [employer]	[place] de [employer] productos a base de hierbas, medicamentos de [employer] o suplementos nutritivos que est tomando. Si usted fuma, consume bebidas alcohlicas o si utiliza drogas ilegales, indqueselo tambin a su profesional de [employer]	[place]. Algunas sustancias pueden interactuar con su medicamento. A qu debo estar atento al usar [employer]? Informe a su mdico o a Barrister's clerk de la [employer] todos los efectos secundarios que persistan despus de 3 das. Llame a su proveedor de atencin mdica si se presentan sntomas inusuales dentro de las 6 semanas posteriores a la vacunacin. Es posible que todava pueda contraer la gripe, pero la enfermedad no ser tan fuerte como normalmente. No puede contraer la gripe de esta vacuna. La vacuna antigripal no le protege contra resfros u otras enfermedades que pueden causar [place]. Debe vacunarse cada ao. Qu efectos secundarios puedo tener al [employer] este medicamento? Efectos secundarios que debe informar a su mdico o a Barrister's clerk de la salud tan pronto como sea posible: -reacciones alrgicas como erupcin cutnea, picazn o urticarias, hinchazn de la cara, labios o lengua Efectos secundarios que, por lo general, no requieren atencin mdica (debe informarlos a su mdico o a su profesional de la salud si persisten o si son molestos): -fiebre -dolor de cabeza -molestias y dolores musculares -dolor, sensibilidad, enrojecimiento o Database administrator de la inyeccin -cansancio o debilidad Puede ser que [employer]  no menciona todos los posibles efectos secundarios. Comunquese a su mdico por asesoramiento mdico [employer]. Usted puede informar los efectos secundarios a la FDA por telfono al 1-800-FDA-1088. Dnde debo guardar mi medicina? Esta vacuna se administra solamente en clnicas, farmacias, consultorio mdico u otro consultorio de un profesional de la salud y no Sports coach en su domicilio. ATENCIN: Este folleto es un resumen. Puede ser que no cubra toda la posible informacin. Si usted tiene preguntas acerca de esta medicina, consulte con su mdico, su farmacutico o su profesional de Technical sales engineer.  2015, Elsevier/Gold Standard. (2009-12-10 15:31:40) Tinidazole tablets Qu es este medicamento? El TINIDAZOL es un medicamento antiinfeccioso. Se utiliza para tratar la amebiasis, giardiasis, tricomonosis y vaginosis. No es efectivo para resfros, gripe u otras infecciones de origen viral. Este medicamento puede ser utilizado para otros usos; si tiene alguna pregunta consulte con su proveedor de atencin mdica o con su farmacutico. MARCAS COMERCIALES DISPONIBLES: Tindamax Qu le debo informar a mi profesional de la salud antes de tomar este medicamento? Necesita saber si usted presenta alguno de los siguientes problemas o situaciones: -anemia u otros trastornos sanguneos -si consume bebidas alcohlicas con frecuencia -recibe hemodilisis -trastorno de convulsiones -una reaccin alrgica o inusual al tinidazol, a otros medicamentos, alimentos, colorantes o conservantes -si est embarazada o buscando quedar embarazada -si est amamantando a un beb Cmo debo utilizar este medicamento? Tome este medicamento por va oral con un vaso lleno de agua. Siga las instrucciones de la etiqueta del [place]. Tomar con alimentos. Tome sus dosis a intervalos regulares. No tome su medicamento con una  frecuencia mayor a la indicada. Complete todas las dosis de su medicamento como se le haya indicado aun si se siente mejor. No omita ninguna dosis o suspenda el uso de su medicamento antes de lo indicado. Hable con su pediatra para informarse acerca del uso de este medicamento en nios. Aunque este medicamento ha sido recetado a nios tan menores como de 3 aos de edad para condiciones selectivas, las precauciones se aplican. Sobredosis: Pngase en contacto inmediatamente con un centro toxicolgico o una sala de urgencia si usted cree que haya tomado demasiado medicamento. ATENCIN: [employer] es solo para usted. No comparta este medicamento con nadie. Qu sucede si me olvido de una dosis? Si olvida una dosis, tmela lo antes posible. Si es casi la hora de la prxima dosis, tome slo esa dosis. No tome dosis adicionales o dobles. Qu puede interactuar con este medicamento? No tome esta medicina con ninguno de los siguientes medicamentos: -alcohol o cualquier producto que contenga alcohol -solucin oral de amprenavir -disulfiram -inyeccin de paclitaxel -solucin oral de ritonavir -solucin oral de sertralina -inyeccin de sulfametoxasol-trimetoprima Esta medicina tambin puede interactuar con los siguientes medicamentos: -colestiramina -cimetidina -conivaptn -ciclosporina -fluorouracilo -fosfenitona, fenitona -quetoconazol -litio -fenobarbital -tacrolimo -warfarina Puede ser que esta lista no menciona todas las posibles interacciones. Informe a su profesional de [employer]	[place] de [employer] productos a base de hierbas, medicamentos de [place] o suplementos nutritivos que est tomando. Si usted fuma, consume bebidas alcohlicas o si utiliza drogas ilegales, indqueselo tambin a su profesional de [employer]	[place]. Algunas sustancias pueden interactuar con su medicamento. A qu debo estar atento al usar [employer]? Si los sntomas no mejoran o si empeoran, consulte con su mdico o  con su profesional de [employer]	[place]. Evite consumir las bebidas alcohlicas mientras toma este medicamento y durante tres das despus. El alcohol puede hacerle sentir mareado, enfermo o enrojecimiento. Si est recibiendo Clinical research associate  para alguna enfermedad de transmisin sexual, evite todo contacto sexual hasta que haya terminado el [place]. Es posible que su pareja tambin necesite [place]. Qu efectos secundarios puedo tener al [employer] este medicamento? Efectos secundarios que debe informar a su mdico o a Barrister's clerk de la salud tan pronto como sea posible: -Chief of Staff como erupcin cutnea, picazn o urticarias, hinchazn de la cara, labios o lengua -problemas respiratorios -confusin, depresin -manchas oscuras o blancas en la boca -sensacin de desmayos o mareos, cadas -fiebre, infeccin -entumecimiento, hormigueo, dolor o debilidad en las manos o pies -dolor al orinar -convulsiones -cansancio o debilidad inusual -irritacin o flujo vaginal -vmito Efectos secundarios que, por lo general, no requieren atencin mdica (debe informarlos a su mdico o a su profesional de la salud si persisten o si son molestos): -orina de color marrn oscuro o rojo -diarrea -dolor de cabeza -prdida del apetito -sabor metlico -nuseas -Higher education careers adviser Puede ser que esta lista no menciona todos los posibles efectos secundarios. Comunquese a su mdico por asesoramiento mdico [employer]. Usted puede informar los efectos secundarios a la FDA por telfono al 1-800-FDA-1088. Dnde debo guardar mi medicina? Mantngala fuera del alcance de los nios. Gurdela a [employer], entre 15 y 62 grados C (62 y 32 grados F). Protjala de la luz y de la humedad. Mantenga el envase bien cerrado. Deseche todo el medicamento que no haya utilizado, despus de la fecha de vencimiento. ATENCIN: Este folleto es un resumen. Puede ser que no cubra toda la posible  informacin. Si usted tiene preguntas acerca de esta medicina, consulte con su mdico, su farmacutico o su profesional de Technical sales engineer.  2015, Elsevier/Gold Standard. (2007-01-16 16:39:00) Bacterial Vaginosis Bacterial vaginosis is a vaginal infection that occurs when the normal balance of bacteria in the vagina is disrupted. It results from an overgrowth of certain bacteria. This is the most common vaginal infection in women of childbearing age. Treatment is important to prevent complications, especially in pregnant women, as it can cause a premature delivery. CAUSES  Bacterial vaginosis is caused by an increase in harmful bacteria that are normally present in smaller amounts in the vagina. Several different kinds of bacteria can cause bacterial vaginosis. However, the reason that the condition develops is not fully understood. RISK FACTORS Certain activities or behaviors can put you at an increased risk of developing bacterial vaginosis, including:  Having a new sex partner or multiple sex partners.  Douching.  Using an intrauterine device (IUD) for contraception. Women do not get bacterial vaginosis from toilet seats, bedding, swimming pools, or contact with objects around them. SIGNS AND SYMPTOMS  Some women with bacterial vaginosis have no signs or symptoms. Common symptoms include:  Grey vaginal discharge.  A fishlike odor with discharge, especially after sexual intercourse.  Itching or burning of the vagina and vulva.  Burning or pain with urination. DIAGNOSIS  Your health care provider will take a medical history and examine the vagina for signs of bacterial vaginosis. A sample of vaginal fluid may be taken. Your health care provider will look at this sample under a microscope to check for bacteria and abnormal cells. A vaginal pH test may also be done.  TREATMENT  Bacterial vaginosis may be treated with antibiotic medicines. These may be given in the form of a pill or a vaginal  cream. A second round of antibiotics may be prescribed if the condition comes back after treatment.  HOME CARE INSTRUCTIONS   Only take over-the-counter or prescription medicines  as directed by your health care provider.  If antibiotic medicine was prescribed, take it as directed. Make sure you finish it even if you start to feel better.  Do not have sex until treatment is completed.  Tell all sexual partners that you have a vaginal infection. They should see their health care provider and be treated if they have problems, such as a mild rash or itching.  Practice safe sex by using condoms and only having one sex partner. SEEK MEDICAL CARE IF:   Your symptoms are not improving after 3 days of treatment.  You have increased discharge or pain.  You have a fever. MAKE SURE YOU:   Understand these instructions.  Will watch your condition.  Will get help right away if you are not doing well or get worse. FOR MORE INFORMATION  Centers for Disease Control and Prevention, Division of STD Prevention: AppraiserFraud.fi American Sexual Health Association (ASHA): www.ashastd.org  Document Released: 06/08/2005 Document Revised: 03/29/2013 Document Reviewed: 01/18/2013 [facility] Patient Information 2015 [place], [place]. This information is not intended to replace advice given to you by your health care provider. Make sure you discuss any questions you have with your health care provider.

## 2014-03-26 NOTE — Addendum Note (Signed)
Addended by: Thurnell Garbe A on: 03/26/2014 05:24 PM   Modules accepted: Orders

## 2014-03-26 NOTE — Addendum Note (Signed)
Addended by: Joaquin Music on: 03/26/2014 04:03 PM   Modules accepted: Orders

## 2014-03-26 NOTE — Progress Notes (Signed)
Kristie Hill [birth year] [id number]   History:    43 y.o.  for annual gyn exam with 2 complaints today. The first one is having a thick white vaginal discharge for past few days. The second complaint is having right lower quadrant pain for the past month. Patient also has history of oligomenorrhea in the past as well as hyperprolactinemia. 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 She had a normal CA 125 as well as TSH and prolactin. She was given Provera to withdrawal and start the Junel 1/20 oral contraceptive pill continuously and withdrawal every 3 months but did not continue the oral contraceptive pill after 3 months. 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 Patient currently not interested in conceiving. The patient would no past history of abnormal Pap smear. Patient interested in vaccine today. Last menstrual period September 22.    Past medical history,surgical history, family history and social history were all reviewed and documented in the EPIC chart.  Gynecologic History Patient's last menstrual period was 03/13/2014. Contraception: none Last Pap: 2013. Results were: normal Last mammogram: 2015. Results were: Patient reports it was done this year and it was normal  Obstetric History OB History  Gravida Para Term Preterm AB SAB TAB Ectopic Multiple Living  5    5 5     0    # Outcome Date GA Lbr Len/2nd Weight Sex Delivery Anes PTL Lv  5 SAB           4 SAB           3 SAB           2 SAB           1 SAB                ROS: A ROS was performed and pertinent positives and negatives are included in the history.  GENERAL: No fevers or chills. HEENT: No change in vision, no earache, sore throat or sinus congestion. NECK: No pain or stiffness. CARDIOVASCULAR: No chest pain or pressure. No palpitations. PULMONARY: No shortness of breath, cough or wheeze. GASTROINTESTINAL: No abdominal pain, nausea, vomiting or diarrhea, melena or bright red blood per rectum. GENITOURINARY: No urinary frequency, urgency, hesitancy or dysuria. MUSCULOSKELETAL: No joint or muscle pain, no back pain, no recent trauma. DERMATOLOGIC: No rash, no itching, no lesions. ENDOCRINE: No polyuria, polydipsia, no heat or cold intolerance. No recent change in weight. HEMATOLOGICAL: No anemia or easy bruising or bleeding. NEUROLOGIC: No headache, seizures, numbness, tingling or weakness. PSYCHIATRIC: No depression, no loss of interest in normal activity or change in sleep pattern.  Patient complaining of right lower quadrant discomfort Complaining right vaginal discharge   Exam: chaperone present  BP 128/86  Ht 5\' 4"  (1.626 m)  Wt 195 lb (88.451 kg)  BMI 33.46 kg/m2  LMP 03/13/2014  Body mass index is 33.46 kg/(m^2).  General appearance : Well developed well nourished female. No acute distress HEENT: Neck supple, trachea midline, no carotid bruits, no thyroidmegaly Lungs: Clear to auscultation, no rhonchi or wheezes, or rib retractions  Heart: Regular rate and rhythm, no murmurs or gallops Breast:Examined in sitting and supine position were symmetrical  in appearance, no palpable masses or tenderness,  no skin retraction, no nipple inversion, no nipple discharge, no skin discoloration, no axillary or supraclavicular lymphadenopathy Abdomen: no palpable masses or tenderness, no rebound or guarding Extremities: no edema or skin discoloration or tenderness  Pelvic:  Bartholin, Urethra, Skene Glands: Within normal limits             Vagina: No gross lesions or discharge  Cervix: No gross lesions or discharge  Uterus  anteverted, normal  size, shape and consistency, non-tender and mobile  Adnexa  tenderness and guarding right lower quadrant  Anus and perineum  normal   Rectovaginal  normal sphincter tone without palpated masses or tenderness             Hemoccult that indicated   Wet prep:Amine pos, many clue cell, too numerous to count bacteria  Assessment/Plan:  43 y.o. female for annual exam with past history of oligomenorrhea and hyperprolactinemia. Patient is not taking her Parlodel and over 4 months. She had been placed on a low dose oral contraceptive pill which she was to take continuously and withdrawal every 3 months but did not continue beyond 3 months. This will be represcribed again and she will be reminded once again upon  the importance of compliance. She will return back to the office next week in  a fasting state for the following labs: Fasting lipid profile, comprehensive metabolic panel, TSH, CBC, urinalysis, and prolactin. Will also do a pelvic ultrasound to followup on her right adnexa to see if  she has a recurrence of her ovarian cyst. Patient did receive a flu vaccine today. Pap smear was not done in accordance to the new guidelines. For patient's bacterial vaginosis she will be prescribed Tindamax 500 mg 4 tablets today and then repeat in 24 hours. Pap smear was not done today.   Terrance Mass MD, 2:48 PM 03/26/2014

## 2014-03-27 LAB — URINALYSIS W MICROSCOPIC + REFLEX CULTURE
BACTERIA UA: NONE SEEN
Bilirubin Urine: NEGATIVE
Casts: NONE SEEN
Crystals: NONE SEEN
Glucose, UA: NEGATIVE mg/dL
HGB URINE DIPSTICK: NEGATIVE
KETONES UR: NEGATIVE mg/dL
Leukocytes, UA: NEGATIVE
NITRITE: NEGATIVE
Protein, ur: NEGATIVE mg/dL
Specific Gravity, Urine: 1.027 (ref 1.005–1.030)
Squamous Epithelial / LPF: NONE SEEN
Urobilinogen, UA: 0.2 mg/dL (ref 0.0–1.0)
pH: 5 (ref 5.0–8.0)

## 2014-04-09 ENCOUNTER — Other Ambulatory Visit: Payer: Self-pay | Admitting: Gynecology

## 2014-04-09 ENCOUNTER — Ambulatory Visit (INDEPENDENT_AMBULATORY_CARE_PROVIDER_SITE_OTHER): Payer: BC Managed Care – PPO

## 2014-04-09 ENCOUNTER — Encounter: Payer: Self-pay | Admitting: Gynecology

## 2014-04-09 ENCOUNTER — Ambulatory Visit (INDEPENDENT_AMBULATORY_CARE_PROVIDER_SITE_OTHER): Payer: BC Managed Care – PPO | Admitting: Gynecology

## 2014-04-09 VITALS — BP 126/74

## 2014-04-09 DIAGNOSIS — N8300 Follicular cyst of ovary, unspecified side: Secondary | ICD-10-CM

## 2014-04-09 DIAGNOSIS — Z8639 Personal history of other endocrine, nutritional and metabolic disease: Secondary | ICD-10-CM

## 2014-04-09 DIAGNOSIS — N8320 Unspecified ovarian cysts: Secondary | ICD-10-CM

## 2014-04-09 DIAGNOSIS — N83201 Unspecified ovarian cyst, right side: Secondary | ICD-10-CM

## 2014-04-09 DIAGNOSIS — N941 Unspecified dyspareunia: Secondary | ICD-10-CM

## 2014-04-09 DIAGNOSIS — Z01419 Encounter for gynecological examination (general) (routine) without abnormal findings: Secondary | ICD-10-CM

## 2014-04-09 DIAGNOSIS — Z8742 Personal history of other diseases of the female genital tract: Secondary | ICD-10-CM

## 2014-04-09 LAB — COMPREHENSIVE METABOLIC PANEL
ALT: 15 U/L (ref 0–35)
AST: 16 U/L (ref 0–37)
Albumin: 4.4 g/dL (ref 3.5–5.2)
Alkaline Phosphatase: 52 U/L (ref 39–117)
BUN: 15 mg/dL (ref 6–23)
CALCIUM: 9.5 mg/dL (ref 8.4–10.5)
CHLORIDE: 103 meq/L (ref 96–112)
CO2: 25 meq/L (ref 19–32)
CREATININE: 0.67 mg/dL (ref 0.50–1.10)
Glucose, Bld: 105 mg/dL — ABNORMAL HIGH (ref 70–99)
Potassium: 4.1 mEq/L (ref 3.5–5.3)
Sodium: 138 mEq/L (ref 135–145)
Total Bilirubin: 0.4 mg/dL (ref 0.2–1.2)
Total Protein: 7.5 g/dL (ref 6.0–8.3)

## 2014-04-09 LAB — CBC WITH DIFFERENTIAL/PLATELET
BASOS ABS: 0 10*3/uL (ref 0.0–0.1)
Basophils Relative: 0 % (ref 0–1)
EOS PCT: 1 % (ref 0–5)
Eosinophils Absolute: 0.1 10*3/uL (ref 0.0–0.7)
HEMATOCRIT: 38.6 % (ref 36.0–46.0)
Hemoglobin: 13 g/dL (ref 12.0–15.0)
LYMPHS PCT: 33 % (ref 12–46)
Lymphs Abs: 2.1 10*3/uL (ref 0.7–4.0)
MCH: 30.3 pg (ref 26.0–34.0)
MCHC: 33.7 g/dL (ref 30.0–36.0)
MCV: 90 fL (ref 78.0–100.0)
MONO ABS: 0.7 10*3/uL (ref 0.1–1.0)
Monocytes Relative: 10 % (ref 3–12)
Neutro Abs: 3.6 10*3/uL (ref 1.7–7.7)
Neutrophils Relative %: 56 % (ref 43–77)
Platelets: 314 10*3/uL (ref 150–400)
RBC: 4.29 MIL/uL (ref 3.87–5.11)
RDW: 13.5 % (ref 11.5–15.5)
WBC: 6.5 10*3/uL (ref 4.0–10.5)

## 2014-04-09 LAB — LIPID PANEL
CHOL/HDL RATIO: 3 ratio
CHOLESTEROL: 182 mg/dL (ref 0–200)
HDL: 61 mg/dL (ref >39)
LDL Cholesterol: 105 mg/dL — ABNORMAL HIGH (ref 0–99)
Triglycerides: 81 mg/dL (ref <150)
VLDL: 16 mg/dL (ref 0–40)

## 2014-04-09 LAB — TSH: TSH: 3.05 u[IU]/mL (ref 0.350–4.500)

## 2014-04-09 LAB — PROLACTIN: Prolactin: 13.9 ng/mL

## 2014-04-09 NOTE — Progress Notes (Signed)
   Patient presented to the office today for followup ultrasound on her left ovarian cyst. She was seen the office for annual exam October this year. She was treated for bacterial vaginosis.Patient also has history of oligomenorrhea in the past as well as hyperprolactinemia. She stated that she stopped taking Parlodel about 4-5 months ago. Patient denies any galactorrhea, visual disturbances, unusual headaches..Patient was seen in December of 2013 and was found to have an ultrasound a right ovarian echo-free thinwall avascular cyst measuring 4.2 x 3.6 x 4.5 cm with a single septum. She had a normal CA 125 as well as TSH and prolactin. She was given Provera to withdrawal and start the Junel 1/20 oral contraceptive pill continuously and withdrawal every 3 months but did not continue the oral contraceptive pill after 3 months. 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 Patient currently not interested in conceiving.  If ultrasound today: Uterus measured 8.0.5 centimeters. Right ovary normal. Previous ovarian cyst not seen. Left ovarian follicle echo-free 0.7 x 1.9 x 2.4 cm. No fluid in the cul-de-sac.   Assessment/plan: Patient with past history of oligomenorrhea and hyperprolactinemia. She has  not taken her Parlodel in past 4 months. She is fasting today her blood work is being drawn today including prolactin level today. She will start the Junel 1/20  oral contraceptive pill with her upcoming menstrual cycle. She will take continuously  and withdrawal every 3 months. When she returned back in 6 months we'll followup with an ultrasound was benign simple left ovarian wall will notify her there is any abnormality of any the above lab work.Marland Kitchen

## 2014-04-23 ENCOUNTER — Encounter: Payer: Self-pay | Admitting: Gynecology

## 2014-11-25 ENCOUNTER — Emergency Department (HOSPITAL_COMMUNITY)
Admission: EM | Admit: 2014-11-25 | Discharge: 2014-11-25 | Disposition: A | Discharge status: Home / Self Care | Payer: BLUE CROSS/BLUE SHIELD | Attending: Emergency Medicine | Admitting: Emergency Medicine

## 2014-11-25 ENCOUNTER — Emergency Department (HOSPITAL_COMMUNITY): Payer: BLUE CROSS/BLUE SHIELD

## 2014-11-25 ENCOUNTER — Encounter (HOSPITAL_COMMUNITY): Payer: Self-pay | Admitting: *Deleted

## 2014-11-25 DIAGNOSIS — Z8742 Personal history of other diseases of the female genital tract: Secondary | ICD-10-CM | POA: Insufficient documentation

## 2014-11-25 DIAGNOSIS — Z3202 Encounter for pregnancy test, result negative: Secondary | ICD-10-CM | POA: Insufficient documentation

## 2014-11-25 DIAGNOSIS — Z792 Long term (current) use of antibiotics: Secondary | ICD-10-CM | POA: Insufficient documentation

## 2014-11-25 DIAGNOSIS — R102 Pelvic and perineal pain: Secondary | ICD-10-CM | POA: Diagnosis not present

## 2014-11-25 DIAGNOSIS — Z90710 Acquired absence of both cervix and uterus: Secondary | ICD-10-CM | POA: Diagnosis not present

## 2014-11-25 DIAGNOSIS — M79606 Pain in leg, unspecified: Secondary | ICD-10-CM | POA: Insufficient documentation

## 2014-11-25 DIAGNOSIS — Z8639 Personal history of other endocrine, nutritional and metabolic disease: Secondary | ICD-10-CM | POA: Insufficient documentation

## 2014-11-25 DIAGNOSIS — Z79899 Other long term (current) drug therapy: Secondary | ICD-10-CM | POA: Insufficient documentation

## 2014-11-25 LAB — CBC WITH DIFFERENTIAL/PLATELET
BASOS ABS: 0 10*3/uL (ref 0.0–0.1)
BASOS PCT: 0 % (ref 0–1)
Eosinophils Absolute: 0.1 10*3/uL (ref 0.0–0.7)
Eosinophils Relative: 1 % (ref 0–5)
HCT: 37 % (ref 36.0–46.0)
Hemoglobin: 12.6 g/dL (ref 12.0–15.0)
LYMPHS PCT: 23 % (ref 12–46)
Lymphs Abs: 2.3 10*3/uL (ref 0.7–4.0)
MCH: 30.7 pg (ref 26.0–34.0)
MCHC: 34.1 g/dL (ref 30.0–36.0)
MCV: 90.2 fL (ref 78.0–100.0)
MONO ABS: 0.6 10*3/uL (ref 0.1–1.0)
MONOS PCT: 7 % (ref 3–12)
Neutro Abs: 6.9 10*3/uL (ref 1.7–7.7)
Neutrophils Relative %: 69 % (ref 43–77)
PLATELETS: 249 10*3/uL (ref 150–400)
RBC: 4.1 MIL/uL (ref 3.87–5.11)
RDW: 13.2 % (ref 11.5–15.5)
WBC: 9.9 10*3/uL (ref 4.0–10.5)

## 2014-11-25 LAB — COMPREHENSIVE METABOLIC PANEL
ALT: 18 U/L (ref 14–54)
AST: 19 U/L (ref 15–41)
Albumin: 4.2 g/dL (ref 3.5–5.0)
Alkaline Phosphatase: 51 U/L (ref 38–126)
Anion gap: 9 (ref 5–15)
BILIRUBIN TOTAL: 0.3 mg/dL (ref 0.3–1.2)
BUN: 16 mg/dL (ref 6–20)
CO2: 24 mmol/L (ref 22–32)
Calcium: 9.8 mg/dL (ref 8.9–10.3)
Chloride: 105 mmol/L (ref 101–111)
Creatinine, Ser: 0.77 mg/dL (ref 0.44–1.00)
GFR calc Af Amer: >60 mL/min (ref >60)
GFR calc non Af Amer: >60 mL/min (ref >60)
Glucose, Bld: 117 mg/dL — ABNORMAL HIGH (ref 65–99)
Potassium: 3.6 mmol/L (ref 3.5–5.1)
Sodium: 138 mmol/L (ref 135–145)
Total Protein: 7.7 g/dL (ref 6.5–8.1)

## 2014-11-25 LAB — URINALYSIS, ROUTINE W REFLEX MICROSCOPIC
Bilirubin Urine: NEGATIVE
Glucose, UA: NEGATIVE mg/dL
Ketones, ur: NEGATIVE mg/dL
LEUKOCYTES UA: NEGATIVE
Nitrite: NEGATIVE
PH: 5 (ref 5.0–8.0)
Protein, ur: NEGATIVE mg/dL
Specific Gravity, Urine: 1.01 (ref 1.005–1.030)
UROBILINOGEN UA: 0.2 mg/dL (ref 0.0–1.0)

## 2014-11-25 LAB — WET PREP, GENITAL
Clue Cells Wet Prep HPF POC: NONE SEEN
TRICH WET PREP: NONE SEEN
YEAST WET PREP: NONE SEEN

## 2014-11-25 LAB — URINE MICROSCOPIC-ADD ON

## 2014-11-25 LAB — LIPASE, BLOOD: Lipase: 25 U/L (ref 22–51)

## 2014-11-25 LAB — PREGNANCY, URINE: Preg Test, Ur: NEGATIVE

## 2014-11-25 MED ORDER — HYDROCODONE-ACETAMINOPHEN 5-325 MG PO TABS: 1.0000 | ORAL_TABLET | ORAL | Status: DC | PRN

## 2014-11-25 MED ORDER — FENTANYL CITRATE (PF) 100 MCG/2ML IJ SOLN: 50.0000 ug | Freq: Once | INTRAMUSCULAR | Status: DC

## 2014-11-25 MED ORDER — OXYCODONE-ACETAMINOPHEN 5-325 MG PO TABS
1.0000 | ORAL_TABLET | Freq: Once | ORAL | Status: AC
  Administered 2014-11-25: 1 via ORAL
  Filled 2014-11-25: qty 1

## 2014-11-25 NOTE — Discharge Instructions (Signed)
Dolor plvico  (Pelvic Pain)  Las causa del dolor plvico en la mujer pueden ser muchas y pueden tener su origen en diferentes lugares. El dolor plvico es el que aparece en la mitad inferior del abdomen y [place] caderas. Puede aparecer durante en un perodo corto de tiempo (agudo)o puede ser recurrente (crnico). Esta afeccin puede estar relacionada con trastornos que afectan a los rganos reproductivos femeninos (ginecolgica), pero tambin puede deberse a problemas en la vejiga, clculos renales, complicaciones intestinales, o problemas musculares o esquelticos. Es Materials engineer ayuda de inmediato, sobre todo si ha sido intenso, [place], o ha aparecido de Geographical information systems officer sbita como un dolor inusual. Tambin es importante obtener ayuda de inmediato, ya que algunos tipos de dolor plvico puede poner en peligro la vida.  CAUSAS  A continuacin veremos algunas de las causas del dolor plvico. Las causas pueden clasificarse de diferentes modos.   Ginecolgica.  Enfermedad inflamatoria plvica.  Infecciones de transmisin sexual.  Quiste de ovario o torsin de un ligamento ovrico ( torsin ovrica).  La membrana que recubre internamente al tero desarrollndose fuera del tero (endometriosis).  Fibromas, quistes o tumores.  Ovulacin.  Embarazo.  Embarazo fuera del tero (embarazo ectpico).  Aborto espontneo.  Trabajo de [place].  Desprendimiento de la placenta o ruptura del tero.  Infecciones.  Infeccin uterina (endometritis).  Infeccin de la vejiga.  Diverticulitis.  Aborto relacionado con una infeccin uterina (aborto sptico).  Vejiga.  Inflamacin de la vejiga (cistitis).  Clculos renales.  Gastrointenstinal.  Estreimiento.  Diverticulitis.  Neurolgico.  Traumatismos.  Sentir dolor plvico debido a causas mentales o emocionales (psicosomtico).  Tumores en el intestino o en la pelvis. EVALUACIN  El mdico har una historia clnica detallada  segn sus sntomas. Incluir los cambios recientes en su salud, una cuidadosa historia ginecolgica de sus periodos (menstruaciones) y Ardelia Mems historia de su actividad sexual. Los antecedentes familiares y la historia clnica tambin son importantes. Su mdico podr indicar un examen plvico. El examen plvico ayudar a identificar la ubicacin y la gravedad del Social research officer, government. Tambin ayudar a [employer] rganos que pueden estar involucrados. Romie Minus identificar la causa del dolor plvico y tratarlo adecuadamente, el mdico puede indicar estudios. Estas pruebas pueden ser:   Test de embarazo.  Ecografa plvica.  Radiografa del abdomen.  Un anlisis de [place] o la evaluacin de la secrecin vaginal.  Anlisis de [place]. INSTRUCCIONES PARA EL CUIDADO EN EL HOGAR   Solo tome medicamentos de venta libre o recetados para Conservation officer, historic buildings, Tree surgeon o fiebre, segn las indicaciones del mdico.   Haga reposo segn las indicaciones del mdico.   Consuma una dieta balanceada.   Beba gran cantidad de lquido para mantener la orina de tono claro o amarillo plido.   Evite las relaciones sexuales, Higher education careers adviser.   Aplique compresas calientes o fras en la zona baja del abdomen segn cual le calme el dolor.   Evite las situaciones estresantes.   Lleve un registro del dolor plvico. Anote cundo comenz, dnde se Midwife y si hay cosas que parecen estar asociadas con el dolor, como algn alimento o su ciclo menstrual.  Concurra a las visitas de control con el mdico, segn las indicaciones.  SOLICITE ATENCIN MDICA SI:   Los medicamentos no Buyer, retail.  Tiene flujo vaginal anormal. SOLICITE ATENCIN MDICA DE INMEDIATO SI:   Tiene un sangrado abundante por la vagina.   El dolor plvico aumenta.   Se siente mareada o sufre un desmayo.  Siente escalofros.   Siente dolor intenso al Garment/textile technologist u observa sangre en la orina.   [place] que no puede  controlar.   Tiene fiebre o sntomas que persisten durante ms de 3 das.  Tiene fiebre y los sntomas empeoran.   Ha sido abusada fsica o sexualmente.  ASEGRESE DE QUE:   Comprende estas instrucciones.  Controlar su enfermedad.  Solicitar ayuda de inmediato si no mejora o si empeora. Document Released: 09/04/2008 Document Revised: 10/23/2013 [facility] Patient Information 2015 [place]. This information is not intended to replace advice given to you by your health care provider. Make sure you discuss any questions you have with your health care provider.

## 2014-11-25 NOTE — ED Notes (Signed)
Pt states she has a hx of ovarian cysts - states surgery to remove 3 in 2013.  States pain to RLQ, dizziness and bil leg pain starting today that feels the same.  Pt also c/o R flank pain and green vaginal discharge.  Pt states increased urge to have bm, but denies changes in bladder habits.

## 2014-11-25 NOTE — ED Provider Notes (Signed)
CSN: [id number]     Arrival date & time 11/25/14  1523 History   First MD Initiated Contact with Patient 11/25/14 2009     Chief Complaint  Patient presents with  . Abdominal Pain  . Leg Pain     (Consider location/radiation/quality/duration/timing/severity/associated sxs/prior Treatment) HPI   Kristie Hill is a 44 y.o. female  who presents for evaluation of pelvic pain present for 10 days. The pain is similar to prior pain that she had when she was diagnosed with a right ovarian cyst. She has had prior right ovarian cyst removal. She also has a white vaginal discharge. Her periods of being heavier than usual. Her last menses was 11/21/2014. She has had mild nausea without vomiting. She denies fever, chills, cough, shortness of breath, chest pain, weakness or dizziness. There are no other known modifying factors.   Past Medical History  Diagnosis Date  . Recurrent pregnancy loss   . Oligomenorrhea   . Blood type, Rh negative     O negative  . Hormone disorder 2012    hyperprolactinemia  . No pertinent past medical history    Past Surgical History  Procedure Laterality Date  . Dilation and curettage of uterus  01/07/07  . Dilation and curettage of uterus  10/01/10    D&E  . Dilation and curettage of uterus    . Laparoscopy  04/19/2012    Procedure: LAPAROSCOPY OPERATIVE;  Surgeon: Terrance Mass, MD;  Location: [facility];  Service: Gynecology;  Laterality: Right;  Excision of right para-tubal mass. Aspiration of Left ovarian cyst, pelvic washings    Family History  Problem Relation Age of Onset  . Heart disease Father   . Cancer Paternal Grandmother     BRAIN  . Cancer Paternal Grandfather     LUNG   History  Substance Use Topics  . Smoking status: Never Smoker   . Smokeless tobacco: Never Used  . Alcohol Use: Yes     Comment: occ   OB History    Gravida Para Term Preterm AB TAB SAB Ectopic Multiple Living   5    5  5    0     Review of Systems  All other  systems reviewed and are negative.     Allergies  Review of patient's allergies indicates no known allergies.  Home Medications   Prior to Admission medications   Medication Sig Start Date End Date Taking? Authorizing Provider  bromocriptine (PARLODEL) 2.5 MG tablet Take 1 tablet (2.5 mg total) by mouth 2 (two) times daily. 03/08/12 03/08/13  Terrance Mass, MD  HYDROcodone-acetaminophen (NORCO) 5-325 MG per tablet Take 1 tablet by mouth every 4 (four) hours as needed. 11/25/14   Daleen Bo, MD  metroNIDAZOLE (METROGEL) 0.75 % vaginal gel Place 1 Applicatorful vaginally 2 (two) times daily. 05/09/12   Terrance Mass, MD  norethindrone-ethinyl estradiol (MICROGESTIN,JUNEL,LOESTRIN) 1-20 MG-MCG tablet Patient to take OCP continuous and withdraw after third pack. 03/26/14   Terrance Mass, MD  tinidazole (TINDAMAX) 500 MG tablet Take four tablets today and four tablets tomorrow at the same time 03/26/14   Terrance Mass, MD   BP 118/74 mmHg  Pulse 81  Temp(Src) 99 F (37.2 C) (Oral)  Resp 16  Ht 5\' 6"  (1.676 m)  Wt 190 lb (86.183 kg)  BMI 30.68 kg/m2  SpO2 98%  LMP 11/21/2014 Physical Exam  Constitutional: She is oriented to person, place, and time. She appears well-developed and well-nourished.  HENT:  Head:  Normocephalic and atraumatic.  Right Ear: External ear normal.  Left Ear: External ear normal.  Eyes: Conjunctivae and EOM are normal. Pupils are equal, round, and reactive to light.  Neck: Normal range of motion and phonation normal. Neck supple.  Cardiovascular: Normal rate, regular rhythm and normal heart sounds.   Pulmonary/Chest: Effort normal and breath sounds normal. She exhibits no bony tenderness.  Abdominal: Soft. There is no tenderness.  Genitourinary:  Normal external female genitalia. Small amount of white vaginal discharge. Cervix parous normal. No pelvic organomegaly. Mild right-sided adnexal tenderness.  Musculoskeletal: Normal range of motion.   Neurological: She is alert and oriented to person, place, and time. No cranial nerve deficit or sensory deficit. She exhibits normal muscle tone. Coordination normal.  Skin: Skin is warm, dry and intact.  Psychiatric: She has a normal mood and affect. Her behavior is normal. Judgment and thought content normal.  Nursing note and vitals reviewed.   ED Course  Procedures (including critical care time)  Medications  oxyCODONE-acetaminophen (PERCOCET/ROXICET) 5-325 MG per tablet 1 tablet (1 tablet Oral Given 11/25/14 1952)    Patient Vitals for the past 24 hrs:  BP Temp Temp src Pulse Resp SpO2 Height Weight  11/25/14 2330 118/74 mmHg - - 81 16 98 % - -  11/25/14 2300 113/70 mmHg - - 86 - 96 % - -  11/25/14 2249 112/73 mmHg - - 82 12 98 % - -  11/25/14 2234 112/73 mmHg - - 90 16 97 % - -  11/25/14 2130 107/61 mmHg - - 79 - 98 % - -  11/25/14 2030 122/65 mmHg - - 90 16 99 % - -  11/25/14 2000 116/70 mmHg - - 83 16 98 % - -  11/25/14 1945 99/80 mmHg - - 98 16 99 % - -  11/25/14 1900 117/72 mmHg - - 80 16 100 % - -  11/25/14 1830 125/69 mmHg - - 90 - 100 % - -  11/25/14 1824 136/76 mmHg - - 101 16 100 % - -  11/25/14 1549 - - - - - - 5\' 6"  (1.676 m) 190 lb (86.183 kg)  11/25/14 1532 147/79 mmHg 99 F (37.2 C) Oral 95 20 100 % 5\' 6"  (1.676 m) 190 lb (86.183 kg)    11:10 PM Reevaluation with update and discussion. After initial assessment and treatment, an updated evaluation reveals no additional complaints. Findings discussed with patient through translator phone. Al questions answered. Leeland Lovelady L    Labs Review Labs Reviewed  WET PREP, GENITAL - Abnormal; Notable for the following:    WBC, Wet Prep HPF POC FEW (*)    All other components within normal limits  COMPREHENSIVE METABOLIC PANEL - Abnormal; Notable for the following:    Glucose, Bld 117 (*)    All other components within normal limits  URINALYSIS, ROUTINE W REFLEX MICROSCOPIC (NOT AT [facility]) - Abnormal; Notable for the  following:    Hgb urine dipstick TRACE (*)    All other components within normal limits  CBC WITH DIFFERENTIAL/PLATELET  LIPASE, BLOOD  PREGNANCY, URINE  URINE MICROSCOPIC-ADD ON  RPR  HIV ANTIBODY (ROUTINE TESTING)  GC/CHLAMYDIA PROBE AMP ([place]) NOT AT [facility]    Imaging Review US Transvaginal Non-ob  11/25/2014   CLINICAL DATA:  History of ovarian cysts.  RIGHT adnexal pain.  EXAM: TRANSABDOMINAL AND TRANSVAGINAL ULTRASOUND OF PELVIS  TECHNIQUE: Both transabdominal and transvaginal ultrasound examinations of the pelvis were performed. Transabdominal technique was performed for global imaging of  the pelvis including uterus, ovaries, adnexal regions, and pelvic cul-de-sac. It was necessary to proceed with endovaginal exam following the transabdominal exam to visualize the uterus, and endometrium.  COMPARISON:  None  FINDINGS: Uterus  Measurements: 7.3 x 3.1 x 5.0 cm. No fibroids or other mass visualized.  Endometrium  Thickness: 8.8 mm.  No focal abnormality visualized.  Right ovary  Measurements: 2.3 x 1.6 x 1.7 cm. Normal appearance/no adnexal mass.  Left ovary  Measurements: 1 point some of 1.0 x 1.1 cm. Normal appearance/no adnexal mass.  Other findings  No free fluid.  Multiple nabothian cysts.  IMPRESSION: Unremarkable transabdominal and transvaginal ultrasound of the pelvis.   Electronically Signed   By: Rolla Flatten M.D.   On: 11/25/2014 22:33   US Pelvis Complete  11/25/2014   CLINICAL DATA:  History of ovarian cysts.  RIGHT adnexal pain.  EXAM: TRANSABDOMINAL AND TRANSVAGINAL ULTRASOUND OF PELVIS  TECHNIQUE: Both transabdominal and transvaginal ultrasound examinations of the pelvis were performed. Transabdominal technique was performed for global imaging of the pelvis including uterus, ovaries, adnexal regions, and pelvic cul-de-sac. It was necessary to proceed with endovaginal exam following the transabdominal exam to visualize the uterus, and endometrium.  COMPARISON:  None  FINDINGS:  Uterus  Measurements: 7.3 x 3.1 x 5.0 cm. No fibroids or other mass visualized.  Endometrium  Thickness: 8.8 mm.  No focal abnormality visualized.  Right ovary  Measurements: 2.3 x 1.6 x 1.7 cm. Normal appearance/no adnexal mass.  Left ovary  Measurements: 1 point some of 1.0 x 1.1 cm. Normal appearance/no adnexal mass.  Other findings  No free fluid.  Multiple nabothian cysts.  IMPRESSION: Unremarkable transabdominal and transvaginal ultrasound of the pelvis.   Electronically Signed   By: Rolla Flatten M.D.   On: 11/25/2014 22:33     EKG Interpretation None      MDM   Final diagnoses:  Pelvic pain in female    Nonspecific pelvic pain, with reassuring evaluation. Doubt STD, significant vaginosis, ovarian cyst or intrapelvic pathology.  Nursing Notes Reviewed/ Care Coordinated Applicable Imaging Reviewed Interpretation of Laboratory Data incorporated into ED treatment  The patient appears reasonably screened and/or stabilized for discharge and I doubt any other medical condition or other [facility] requiring further screening, evaluation, or treatment in the ED at this time prior to discharge.  Plan: Home Medications- Norco; Home Treatments- rest; return here if the recommended treatment, does not improve the symptoms; Recommended follow up- PCP prn     Daleen Bo, MD 11/25/14 [phone]

## 2014-11-26 LAB — RPR: RPR: NONREACTIVE

## 2014-11-26 LAB — GC/CHLAMYDIA PROBE AMP (~~LOC~~) NOT AT ARMC
Chlamydia: NEGATIVE
Neisseria Gonorrhea: NEGATIVE

## 2014-11-26 LAB — HIV ANTIBODY (ROUTINE TESTING W REFLEX): HIV SCREEN 4TH GENERATION: NONREACTIVE

## 2014-12-04 ENCOUNTER — Encounter: Payer: Self-pay | Admitting: Women's Health

## 2014-12-04 ENCOUNTER — Ambulatory Visit (INDEPENDENT_AMBULATORY_CARE_PROVIDER_SITE_OTHER): Payer: BLUE CROSS/BLUE SHIELD | Admitting: Women's Health

## 2014-12-04 VITALS — BP 118/76

## 2014-12-04 DIAGNOSIS — B373 Candidiasis of vulva and vagina: Secondary | ICD-10-CM

## 2014-12-04 DIAGNOSIS — B3731 Acute candidiasis of vulva and vagina: Secondary | ICD-10-CM

## 2014-12-04 DIAGNOSIS — R197 Diarrhea, unspecified: Secondary | ICD-10-CM | POA: Diagnosis not present

## 2014-12-04 LAB — WET PREP FOR TRICH, YEAST, CLUE
Clue Cells Wet Prep HPF POC: NONE SEEN
Trich, Wet Prep: NONE SEEN

## 2014-12-04 MED ORDER — TERCONAZOLE 0.4 % VA CREA: 1.0000 | TOPICAL_CREAM | Freq: Every day | VAGINAL | Status: DC

## 2014-12-04 NOTE — Patient Instructions (Signed)
Opciones de alimentos para ayudar a Holiday representative (Food Choices to Help Relieve Diarrhea) Cuando se tiene diarrea, los alimentos que se ingieren y los hbitos de alimentacin son Theatre stage manager. Elegir los [employer] y las bebidas adecuados ayuda a Holiday representative. [place]ems, debido a que la diarrea puede [place], debe reponer la prdida de lquidos y Brewing technologist (como sodio, potasio y Financial controller) a fin de ayudar a Tree surgeon.  [place]?  Beba lentamente 1 taza (8 onzas) de lquido por cada episodio de diarrea. Si bebe una cantidad de lquidos suficiente, la orina ser de tono claro o color amarillo plido.  Consuma alimentos con almidn. Algunas buenas opciones son arroz blanco, tostada blanca, pasta, cereales con bajo contenido de fibras, papas al horno (sin cscara), galletas saladas y panecillos.  Evite las porciones grandes de cualquier vegetal cocido.  [place] a dos porciones por da. Una porcin es  taza o un trozo pequeo.  Alimentos con menos de 2 g de fibra por porcin.  Limite las grasas a menos de 8 cucharaditas (38g) por Training and development officer.  Evite las comidas fritas.  Consuma alimentos que contengan probiticos. Los probiticos se encuentran en ciertos productos lcteos.  Evite los alimentos y las bebidas que pueden aumentar la velocidad a la que el alimento se mueve a travs del estmago y de los intestinos (tracto gastrointestinal). Lo que debe evitar:  Alimentos ricos en fibra, como frutas secas, frutas y vegetales crudos, frutos secos, semillas, alimentos con cereales integrales.  Alimentos muy condimentados y con alto contenido de Physicist, medical.  Alimentos y bebidas endulzados con jarabe de maz de alto contenido de fructosa, miel o alcoholes de Location manager, como xilitol, sorbitol y manitol. QU ALIMENTOS SE RECOMIENDAN? Cereales Arroz [place], francs o pita (fresco o tostado), incluidos los [place], los bollos y  las rosquillas. Pastas blancas. Galletas de [place], [place] o [place]. Pretzels. Cereales con bajo contenido de [employer] cocidos en agua (como harina de maz, smola o crema de cereales). Muffins. [place]. [place].  Vegetales Papas (sin cscara). Jugo de tomates o de vegetales Vegetales bien cocidos o enlatados sin semillas. Valeda Malm tierna. Frutas Pur de [employer] cocido o enlatado, damascos, cerezas, cctel de frutas, pomelos, duraznos, peras o ciruelas. Bananas frescas, manzanas sin cscara, cerezas, uvas, meln, pomelo, duraznos, naranjas o ciruelas.  Carnes y otros productos con protenas Pollo al horno o hervido. Huevos. Tofu. Pescado. Mariscos. Boomer de man, sin trozos. Carne molida o un bife tierno bien cocido, jamn, ternera, cordero, cerdo o aves.  Lcteos Yogur natural, kefir y Estate agent bebible sin Risk analyst. Leche sin Comptroller, suero de [place] de soja. Queso duro comn. Bebidas Bebidas deportivas. Caldos claros. Jugos de fruta diluidos (excepto de ciruelas). Gaseosas sin cafena comunes, como gaseosa de [place]. Agua. Ts descafeinados. Soluciones de rehidratacin oral. Bebidas sin azcar no endulzadas con alcoholes de azcar. Otros Consom, caldo o sopas hechas con los alimentos recomendados.  Los artculos mencionados arriba pueden no ser [employer] de las bebidas o los alimentos recomendados. Comunquese con el nutricionista para conocer ms opciones. QU ALIMENTOS NO SE RECOMIENDAN? Cereales Cereales, galletas, pastas, panecillos y panes de cereales integrales, salvado o centeno. Arroz integral o arroz salvaje. Cereales con menos de 2 g de fibra por porcin. Tortillas de maz o tacos. Harina de avena cocida o seca. Granola. Palomitas de maz. Vegetales Vegetales crudos. Repollo, brcoli, repollitos de [place], alcachofas, porotos, hojas de remolacha, maz, col rizada, legumbres,  guisantes y batatas. Cscara de papas. [place] y repollo  cocidos. Lambert Mody Frutas secas, incluidas las ciruelas y los dtiles. Frutas crudas. Compota o ciruelas secas. Manzanas frescas con cscara, damascos, mangos, peras, frambuesas y frutillas.  Carnes y otros productos con protenas [place] de man espesa. Frutos secos y semillas. Porotos y lentejas. Panceta.  Lcteos Quesos con alto contenido de [place]. Leche, leche chocolatada y bebidas hechas con [place], como los batidos. Crema. Helados. Dulces y [employer], donas y pan dulce. Panqueques y waffles. Grasas y [employer]. Salsas a base de crema. Margarina. Aceites para ensaladas. Condimentos para ensaladas. Aceitunas. [place].  Bebidas Bebidas con cafena (como caf, t, refrescos o bebidas energizantes). Bebidas alcohlicas. Jugos de frutas con pulpa. Jugo de ciruelas. Bebidas endulzadas con jarabe de maz de alto contenido de fructosa o alcoholes de Location manager. Otros Coco. Salsa picante. [place] en polvo. [place]. Salsas. Sopas a base de crema o Balm.  Los artculos mencionados arriba pueden no ser [employer] de las bebidas y los alimentos que se Higher education careers adviser. Comunquese con el nutricionista para recibir ms informacin. QU DEBO HACER SI ME DESHIDRATO? Algunas veces, la diarrea puede producir deshidratacin. Entre los signos de deshidratacin se incluyen la orina oscura y la boca y la piel secas. Si piensa que est deshidratado, debe rehidratarse con una solucin de rehidratacin oral. Estas soluciones se pueden comprar en las farmacias, en las tiendas minoristas o por Internet.  Beba  o 1 taza (120-250ml) de solucin de rehidratacin oral cada vez que tenga un episodio de diarrea. Si beber esta cantidad empeora la diarrea, intente beber en cantidades ms pequeas con ms frecuencia. Por ejemplo, tomar 1-3 cucharaditas (5-82ml) cada 5-67minutos.  Una regla general para mantenerse hidratado es beber 1  -2 litros de lquido Market researcher. Hable con el mdico sobre  la cantidad especfica que usted debe beber diariamente. Beba suficiente lquido para Consulting civil engineer orina clara o de color amarillo plido. Document Released: 06/08/2005 Document Revised: 06/13/2013 [facility] Patient Information 2015 [place]. This information is not intended to replace advice given to you by your health care provider. Make sure you discuss any questions you have with your health care provider. Diarrea  (Diarrhea) La diarrea es la materia fecal (heces) acuosas. Puede hacerlo sentir dbil, cansado, sediento, o con la boca seca (signos de deshidratacin). La materia fecal acuosa es signo de otro problema, generalmente una infeccin. Suele durar 2 o 3 das. Puede durar ms tiempo si es el signo de una enfermedad grave. Cudese segn lo que le indique su mdico.  CUIDADOS EN EL HOGAR   Beba 1 taza (8 onzas) de lquido cada vez que tenga una deposicin acuosa.  No beba los siguientes lquidos:  Los que contengan azcares simples (fructosa, glucosa, galactosa, [place], sacarosa, maltosa).  Bebidas deportivas.  Jugos de fruta.  Productos lcteos enteros.  Gaseosas.  Bebidas con cafena (caf, t, gaseosas) o alcohol.  Puede usar soluciones de rehidratacin oral si su mdico lo autoriza. Usted mismo puede preparar la solucin. Siga esta receta:   -  cucharadita de sal.   cucharadita de bicarbonato de soda.   de cucharadita de sal sustituta que contenga cloruro de potasio.  1  cucharada de azcar.  1 litros (34 oz) de agua.  Evite los siguientes alimentos:  Los que tienen gran contenido de [place], como frutas y verduras.  Frutas secas, semillas y panes y cereales integrales.   Las endulzadas con alcohol de azcar (xylitol, sorbitol, manitol).  Trate de  comer los siguientes alimentos:  Los que tienen almidn, como arroz, pan, pasta, cereales bajos en azcar, avena, smola de maz, papas al horno, galletas y panecillos.  Bananas.  Pur de  [employer].  Consuma alimentos ricos en probiticos, como yogur y productos lcteos fermentados.  Lvese bien las manos cada vez que tenga una deposicin acuosa.  Slo tome los medicamentos que le haya indicado su mdico.  Tome un bao caliente para ayudar a Transport planner ardor o dolor que producen las deposiciones aguadas. SOLICITE AYUDA DE INMEDIATO SI:   No puede beber lquidos sin devolver vomitar.  Sigue vomitando.  Tiene sangre en la materia fecal, o es de color negro y de aspecto alquitranado.  No hay emisin de [place] durante 6 a 8 horas o elimina una pequea cantidad de [place].  Usted tiene dolor de estmago (abdominal) que empeora o se mantiene en el mismo lugar (localiza).  Se siente dbil, mareado, confundido o se desmaya.  Siente un dolor de cabeza muy intenso.  La materia fecal acuosa no mejora.  Tiene fiebre o sntomas que persisten durante ms de 2-3 das.  Tiene fiebre y los sntomas empeoran de manera sbita. ASEGRESE DE QUE:   Comprende estas instrucciones.  Controlar su enfermedad.  Solicitar ayuda de inmediato si no mejora o si empeora. Document Released: 05/25/2012 Document Revised: 10/23/2013 [facility] Patient Information 2015 [place]. This information is not intended to replace advice given to you by your health care provider. Make sure you discuss any questions you have with your health care provider. Vaginitis monilisica (Monilial Vaginitis) La vaginitis es una inflamacin (irritacin, hinchazn) de la vagina y la vulva. Esta no es una enfermedad de transmisin sexual.  CAUSAS Este tipo de vaginitis lo causa un hongo (candida) que normalmente se encuentra en la vagina. El hongo candida se ha desarrollado hasta el punto de ocasionar problemas en el equilibrio qumico. SNTOMAS  Secrecin vaginal espesa y blanca.  Hinchazn, picazn, enrojecimiento e inflamacin de la vagina y en algunos casos de los labios vaginales  (vulva).  Ardor o dolor al [employer].  Dolor en [place]. DIAGNSTICO Los factores que favorecen la vaginitis moniliasica son:  Kyla Balzarine de virginidad y postmenopusicas.  Embarazo.  Infecciones.  Sentir cansancio, estar enferma o estresada, especialmente si ya ha sufrido este problema en el pasado.  Diabetes Buen control ayudar a disminur la probabilidad.  Pldoras anticonceptivas  Ropa interior [place].  El uso de espumas de bao, aerosoles femeninos duchas vaginales o tampones con desodorante.  Algunos antibiticos (medicamentos que destruyen grmenes).  Si contrae alguna enfermedad puede sufrir recurrencias espordicas. [place] profesional que lo asiste prescribir medicamentos.  Hay diferentes tipos de cremas y supositorios vaginales que tratan especficamente la vaginitis monilisica. Para infecciones por hongos recurrentes, utilice un supositorio o crema en la vagina dos veces por semana, o segn se le indique.  Tambin podrn utilizarse cremas con corticoides o anti monilisicas para la picazn o la irritacin de la vulva. Consulte con el profesional que la asiste.  Si la crema no da resultado, podr aplicarse en la vagina una solucin con azul de metileno.  El consumo de yogur puede prevenir este tipo de vaginitis. INSTRUCCIONES [place] todos los medicamentos tal como se le indic.  No mantenga relaciones sexuales hasta que el tratamiento se haya completado, o segn las indicaciones del profesional que la asiste.  Tome baos de asiento tibios.  No se aplique duchas vaginales.  No utilice tampones,  especialmente los perfumados.  Use ropa interior de algodn  [employer] pantalones ajustados y las medias tipo panty.  Comunique a sus compaeros sexuales que sufre una infeccin por hongos. Ellos deben concurrir para un control mdico si tienen sntomas como una urticaria leve o picazn.  Sus compaeros  sexuales deben tratarse tambin si la infeccin es difcil de Radiographer, therapeutic.  Practique el sexo seguro - use condones  Algunos medicamentos vaginales ocasionan fallas en los condones de ltex. Los medicamentos vaginales que pueden daar los condones son:  Building services engineer cleocina  Butoconazole (Femstat)  Terconazole (Terazol) supositorios vaginales  Miconazole (Monistat) (es un medicamento de venta libre) SOLICITE ATENCIN MDICA SI:  Waldron Session tiene una temperatura oral de ms de 38,9 C (102 F).  Si la infeccin empeora luego de 2 das de tratamiento.  Si la infeccin no mejora luego de 3 das de tratamiento.  Aparecen ampollas en o alrededor de la vagina.  Si aparece una hemorragia vaginal y no es el momento del perodo.  Siente dolor al [employer].  Presenta problemas intestinales.  Tiene dolor durante las [employer]. Document Released: 03/18/2005 Document Revised: 08/31/2011 [facility] Patient Information 2015 [place]. This information is not intended to replace advice given to you by your health care provider. Make sure you discuss any questions you have with your health care provider.

## 2014-12-04 NOTE — Progress Notes (Signed)
Patient ID: Kristie Hill, female   DOB: 07/04/1970, 44 y.o.   MRN: [id number] Presents with several issues. Having increased vaginal discharge, low right abdominal discomfort, diarrhea. Was seen in the ER  11/25/2014 had a pelvic ultrasound that was normal. Treated at the hospital with Cipro and Diflucan. History of  right ovarian cyst 4 x 4 centimeters that has resolved. Monthly 5 day cycle history of infertility. Pregnancy okay. States pain had been any 10 now 5. Stools had been watery now loose twice daily. States has increased rectal itching especially at night, questions if she has a parasite. Denies fever, urinary symptoms or nausea. Blanca interpreted.  Sam: Appears well. Abdomen soft without rebound or radiation of pain. External genitalia within normal limits, minimal erythema at rectal, speculum exam moderate amount of a white discharge, wet prep positive for yeast. Bimanual no CMT slight tenderness on the right lower quadrant.  Yeast vaginitis Resolving diarrhea  Plan: Terazol 7 one applicator at bedtime for several nights at at bedtime, apply rectally also. Stool for. P&O. Bland diet, avoid dairy. Motrin as needed for discomfort.

## 2015-01-04 ENCOUNTER — Encounter: Payer: Self-pay | Admitting: Gynecology

## 2015-01-04 ENCOUNTER — Ambulatory Visit (INDEPENDENT_AMBULATORY_CARE_PROVIDER_SITE_OTHER): Payer: BLUE CROSS/BLUE SHIELD

## 2015-01-04 ENCOUNTER — Other Ambulatory Visit: Payer: Self-pay | Admitting: Gynecology

## 2015-01-04 ENCOUNTER — Ambulatory Visit (INDEPENDENT_AMBULATORY_CARE_PROVIDER_SITE_OTHER): Payer: BLUE CROSS/BLUE SHIELD | Admitting: Gynecology

## 2015-01-04 VITALS — BP 126/80 | Ht 64.0 in | Wt 194.0 lb

## 2015-01-04 DIAGNOSIS — R197 Diarrhea, unspecified: Secondary | ICD-10-CM | POA: Diagnosis not present

## 2015-01-04 DIAGNOSIS — R102 Pelvic and perineal pain: Secondary | ICD-10-CM

## 2015-01-04 DIAGNOSIS — N831 Corpus luteum cyst of ovary, unspecified side: Secondary | ICD-10-CM

## 2015-01-04 LAB — COMPREHENSIVE METABOLIC PANEL
ALK PHOS: 49 U/L (ref 39–117)
ALT: 12 U/L (ref 0–35)
AST: 17 U/L (ref 0–37)
Albumin: 4.4 g/dL (ref 3.5–5.2)
BILIRUBIN TOTAL: 0.4 mg/dL (ref 0.2–1.2)
BUN: 12 mg/dL (ref 6–23)
CO2: 28 mEq/L (ref 19–32)
CREATININE: 0.76 mg/dL (ref 0.50–1.10)
Calcium: 10 mg/dL (ref 8.4–10.5)
Chloride: 99 mEq/L (ref 96–112)
Glucose, Bld: 94 mg/dL (ref 70–99)
Potassium: 4.7 mEq/L (ref 3.5–5.3)
SODIUM: 136 meq/L (ref 135–145)
Total Protein: 7.7 g/dL (ref 6.0–8.3)

## 2015-01-04 LAB — CBC WITH DIFFERENTIAL/PLATELET
BASOS ABS: 0 10*3/uL (ref 0.0–0.1)
BASOS PCT: 0 % (ref 0–1)
EOS PCT: 1 % (ref 0–5)
Eosinophils Absolute: 0.1 10*3/uL (ref 0.0–0.7)
HEMATOCRIT: 38.5 % (ref 36.0–46.0)
Hemoglobin: 13 g/dL (ref 12.0–15.0)
Lymphocytes Relative: 27 % (ref 12–46)
Lymphs Abs: 2.3 10*3/uL (ref 0.7–4.0)
MCH: 30.9 pg (ref 26.0–34.0)
MCHC: 33.8 g/dL (ref 30.0–36.0)
MCV: 91.4 fL (ref 78.0–100.0)
MONOS PCT: 7 % (ref 3–12)
MPV: 10.4 fL (ref 8.6–12.4)
Monocytes Absolute: 0.6 10*3/uL (ref 0.1–1.0)
NEUTROS PCT: 65 % (ref 43–77)
Neutro Abs: 5.5 10*3/uL (ref 1.7–7.7)
Platelets: 294 10*3/uL (ref 150–400)
RBC: 4.21 MIL/uL (ref 3.87–5.11)
RDW: 13.8 % (ref 11.5–15.5)
WBC: 8.4 10*3/uL (ref 4.0–10.5)

## 2015-01-04 MED ORDER — NORETHINDRONE ACET-ETHINYL EST 1-20 MG-MCG PO TABS: 1.0000 | ORAL_TABLET | Freq: Every day | ORAL | Status: DC

## 2015-01-04 MED ORDER — VANCOMYCIN HCL 125 MG PO CAPS: 125.0000 mg | ORAL_CAPSULE | Freq: Four times a day (QID) | ORAL | Status: DC

## 2015-01-04 MED ORDER — LOPERAMIDE HCL 2 MG PO TABS: 2.0000 mg | ORAL_TABLET | Freq: Four times a day (QID) | ORAL | Status: DC | PRN

## 2015-01-04 NOTE — Patient Instructions (Addendum)
Diarrea  (Diarrhea) La diarrea consiste en evacuaciones intestinales frecuentes, blandas o acuosas. Puede hacerlo sentir dbil y deshidratado. La deshidratacin puede hacer que se sienta cansado, sediento, tener la boca seca y que haya disminucin de [place], que a menudo es de color amarillo oscuro. La diarrea es un signo de otro problema, generalmente una infeccin que no durar [employer]. En la [employer], la diarrea dura tpicamente 2 a 3 das. Sin embargo, puede durar ms tiempo si se trata de un signo de algo ms serio. Es importante tratar la diarrea como lo indique su mdico para disminuir o prevenir futuros episodios de Clinical biochemist.  CAUSAS  Algunas causas comunes son:   Infecciones gastrointestinales causadas por virus, bacterias o parsitos.  Intoxicacin alimentaria o alergias a los alimentos.  Ciertos medicamentos, como los antibiticos, quimioterapia y laxantes.  Edulcorantes artificiales y fructosa.  Los trastornos [employer]. INSTRUCCIONES PARA EL CUIDADO EN EL HOGAR   Asegure una adecuada ingesta de lquidos (hidratacin). Evite los lquidos que contengan azcares simples o las bebidas deportivas, los jugos de frutas, los productos derivados de la leche entera y [place]. Si bebe la cantidad suficiente de lquidos, la orina debe ser clara o amarillo plido. Una solucin de rehidratacin oral se puede comprar en las farmacias, en las tiendas minoristas y por Internet. Se puede preparar una solucin de rehidratacin oral casera con los siguientes ingredientes:   - cucharadita de sal.   cucharadita de bicarbonato.   de cucharadita de sal sustituta que contenga cloruro de potasio.  1  cucharada de azcar.  1l (34 onzas) de agua.  Ciertos alimentos y bebidas pueden aumentar la velocidad a la que el alimento se mueve a travs del tracto gastrointestinal (GI). Estos alimentos y bebidas deben evitarse e incluyen:  Bebidas alcohlicas y con cafena.  Alimentos  ricos en fibra, como frutas y verduras, nueces, semillas, panes y cereales integrales.  Alimentos y bebidas endulzados con alcoholes de azcar, tales como xilitol, sorbitol, y manitol.  Algunos alimentos pueden ser bien tolerados y puede ayudar a [employer], incluyendo:  Alimentos con almidn, como arroz, pan, pasta, cereales bajos en azcar, avena, smola de maz, papas al horno, galletas y panecillos.  Bananas.  Pur de [employer].  Agregue alimentos ricos en probiticos a la dieta del nio para ayudar a aumentar las bacterias saludables en el tracto gastrointestinal, como el yogur y productos lcteos fermentados.  Lvese bien las manos despus de cada episodio de diarrea.  Tome slo medicamentos de venta libre o recetados, segn las indicaciones del [place] un bao caliente para ayudar a disminuir ardor o dolor por los episodios frecuentes de diarrea. SOLICITE ATENCIN MDICA DE INMEDIATO SI:   No puede retener los lquidos.  Tiene vmitos persistentes.  Lollie Marrow en la materia fecal, o las heces son negras y de aspecto alquitranado.  No hay emisin de [place] durante 6 a 8 horas o elimina una pequea cantidad de [place].  Tiene dolor abdominal que aumenta o se localiza.  Est muy mareado o se desvanece.  Sufre un dolor intenso de [place].  La diarrea empeora o no mejora.  Tiene fiebre o sntomas que persisten durante ms de 2 o 3 das.  Tiene fiebre y los sntomas empeoran de manera sbita. ASEGRESE DE QUE:   Comprende estas instrucciones.  Controlar su enfermedad.  Solicitar ayuda de inmediato si no mejora o si empeora. Document Released: 06/08/2005 Document Revised: 05/25/2012 [facility] Patient Information 2015 [place]. This  information is not intended to replace advice given to you by your health care provider. Make sure you discuss any questions you have with your health care provider. Infeccin por clostridium difficile   (Clostridium Difficile Infection) El clostridium difficile (C. difficile) es un germen que se encuentra en el tracto intestinal o colon. Bajo ciertas condiciones, causa diarrea y en algunos casos una enfermedad grave. La forma grave de la enfermedad se conoce como colitis seudomembranosa (generalmente llamada colitis por C. difficile Esta enfermedad puede daar la membrana que tapiza internamente el colon o hacer que ste se agrande (megacolon txico).  CAUSAS  El colon normalmente contiene muchas bacterias diferentes, que incluyen el C. difficile. El equilibrio de las bacterias en el colon puede cambiar durante la enfermedad. Especialmente cuando se toma un antibitico. Tomar antibiticos hace que el C. difficile se desarrolle, se multiplique en exceso y cree una toxina que causa la enfermedad. Los ancianos y las personas con ciertas afecciones mdicas tienen un mayor riesgo de Museum/gallery curator C. difficile.  SNTOMAS   Diarrea acuosa.  Cristy Hilts.  Fatiga.  Prdida del apetito.  Nuseas.  Hinchazn, dolor o sensibilidad abdominal.  Deshidratacin. DIAGNSTICO  Los sntomas pueden hacer que el mdico sospeche una infeccin por c. difficile, especialmente si han utilizado antibiticos en las semanas precedentes. Sin embargo, hay slo 2 maneras de saber con certeza si sufre una infeccin por c. difficile:   Un anlisis de laboratorio que halle la toxina en la materia fecal.  Ardelia Mems caracterstica especfica de una anormalidad (pseudomembrana) en el colon. Esta anormalidad slo puede verse a travs de un sigmoidoscopio o un colonoscopio. Estos procedimientos consisten en la insercin de un dispositivo en el recto para observar el interior del colon. El mdico determinar si estas pruebas son necesarias.  Hailey son tratadas con xito con uno o dos antibiticos especficos, que generalmente se administran por va oral. En lo posible deber suspender los otros  antibiticos que estuviera recibiendo.  Puede ser necesaria la administracin por va Intravenosa (IV) de lquidos y la correccin del desequilibrio electroltico.  En raras ocasiones, se puede necesitar ciruga para extirpar la parte infectada de los intestinos.  Es importante que usted y sus cuidadores se laven cuidadosamente las manos para evitar la propagacin de la infeccin. En el hospital, los mdicos se colocarn batas y guantes para prevenir la transmisin de la bacteria C. difficile. Las habitaciones se limpian regularmente tambin con una solucin que contenga lavandina o un producto que destruya la C. difficile. INSTRUCCIONES PARA EL CUIDADO EN EL HOGAR   Debe ingerir gran cantidad de lquido para mantener la orina de tono claro o color amarillo plido. Evite la [place], la cafena y el alcohol.  Pida instrucciones especficas a su mdico con respecto a la rehidratacin.  Trate de ingerir comidas pequeas y frecuentes en lugar de comidas abundantes.  Tome los antibiticos como se le indic. Tmelos todos, aunque se sienta mejor.  No utilice medicamentos para calmar la diarrea. Esto podra demorar la curacin o causar complicaciones.  Lvese bien las manos luego de ir al bao y antes de preparar alimentos.  Asegrese de [employer] personas con las que convive se lavan las manos con frecuencia.  Desinfecte cuidadosamente todas las superficies con un producto que contenga lavandina. SOLICITE ATENCIN MDICA SI:   La diarrea persiste ms de lo esperado o se repite despus de completar el tratamiento con antibiticos para la infeccin por C. difficile.  Tiene problemas para [employer]  hidratado. SOLICITE ATENCIN MDICA DE INMEDIATO SI:   Comienza a tener fiebre.  Siente cada vez ms dolor o molestias en el abdomen.  Lollie Marrow en la materia fecal, o las heces son negras y de aspecto alquitranado.  Usted no puede retener alimentos slidos ni lquidos. ASEGRESE DE QUE:   Comprende estas instrucciones.Laparoscopa diagnstica (Diagnostic Laparoscopy) La laparoscopa es un procedimiento quirrgico relativamente simple, de uso habitual y breve (menos de una hora) que se lleva a cabo para diagnosticar y tratar enfermedades del abdomen. El laparoscopio (tubo delgado, que emite luz, del tamao de un lpiz y similar a un telescopio) se inserta en el abdomen a travs de una pequea incisin (corte realizado por un cirujano). A travs de este instrumento, el profesional podr observar [employer] rganos del interior del abdomen (vientre) y ver si hay algo anormal. La laparoscopa podr llevarse a cabo tanto en el hospital como en un consultorio. Podrn administrarle un sedante suave que lo ayudar a relajarse antes y durante el procedimiento. Una vez en la sala de operaciones, le administrarn una anestesia general (a menos que usted y el profesional elijan otro tipo de anestesia). Despus de la laparoscopa, que generalmente dura menos de Leone Brand, ser eBay sala de recuperacin durante algunas horas. Cuando regrese a Medical illustrator, la Hotel manager [employer] y [place]. RIESGOS Y COMPLICACIONES Comparados con los beneficios, los riesgos de la laparoscopia son relativamente pocos. El Management consultant con usted los riesgos antes del procedimiento. Algunos problemas que pueden ocurrir luego de la intervencin son: Infecciones. Hemorragias. Puede ocurrir que se lesionen otros rganos. Efectos secundarios de Architect. PROCEDIMIENTO Una vez que se encuentra anestesiado, el cirujano insufla el abdomen con un gas inofensivo (dixido de carbono) para Museum/gallery exhibitions officer observacin de los rganos de la pelvis. El cirujano introduce el laparoscopio a travs de una pequea incisin en el ombligo o alrededor del mismo. Podr insertar otros instrumentos, como una sonda para mover los rganos o Optometrist algn procedimiento a travs de otra pequea  incisin.  En ocasiones se toma una biopsia (muestra de tejido) para un diagnstico ms preciso o para Conservator, museum/gallery. La biopsia consiste en tomar una pequea muestra de tejido durante la laparoscopia para enviarlo al patlogo (especialista en la observacin de clulas y muestras de tejido) y que lo examine en el microscopio para un diagnstico a nivel de los tejidos. DESPUES DEL PROCEDIMIENTO Se libera el gas del abdomen. Las incisiones se cierran con puntos (suturas). Debido a que las incisiones son pequeas (generalmente de menos de 1 cm) las molestias son mnimas luego del procedimiento. Es posible que sienta cierto Loss adjuster, chartered. Es consecuencia de Programmer, systems del tubo mientras se encontraba dormido. Es posible que sienta algn dolor abdominal no muy intenso. Tambin podr sentir [employer] en las incisiones realizadas para insertar los instrumentos en el abdomen. El tiempo de recuperacin es reducido, siempre que no haya habido complicaciones. Har reposo en la sala de recuperacin hasta que se encuentre estable y se sienta bien. Si no aparecen complicaciones, podr regresar a su casa. AVERIGE LOS RESULTADOS DE SU ANLISIS Durante su visita no contar con todos los [employer]. En este caso, tenga otra entrevista con su mdico para conocerlos. No piense que el resultado es normal si no tiene noticias de su mdico o de la institucin mdica. Es Building services engineer seguimiento de todos los [place] de [place]. [place] todos los medicamentos  tal como se le indic. Utilice los medicamentos de venta libre o de prescripcin para Conservation officer, historic buildings, Health and safety inspector o la [place], segn se lo indique el profesional que lo asiste. Reanude las actividades habituales cuando se le indique. Es preferible que se duche y no tome baos de inmersin. Reanude la actividad sexual luego de [place], o cuando lo autoricen. No conduzca mientras se  encuentre bajo los efectos de narcticos. SOLICITE ATENCIN MDICA SI: Siente un dolor abdominal inexplicable. Siente dolor en los hombros, en la regin de los tirantes. Si se siente aturdido o siente que se va a Insurance account manager. Siente escalofros. Usted o su nio tienen una temperatura oral de ms de 38,9 C (102 F). Observa un drenaje purulento (similar al pus) que proviene de alguna de las heridas. Usted o su hijo no puede realizar movimientos intestinales o evacuar gases. Usted o su hijo sufren nuseas o vmitos. EST SEGURO QUE:  Comprende las instrucciones para el alta mdica. Controlar su enfermedad. Solicitar atencin mdica de inmediato segn las indicaciones. Document Released: 06/08/2005 Document Revised: 08/31/2011 [facility] Patient Information 2015 [place]. This information is not intended to replace advice given to you by your health care provider. Make sure you discuss any questions you have with your health care provider.    Controlar su enfermedad.  Solicitar ayuda de inmediato si no mejora o si empeora. Document Released: 03/18/2005 Document Revised: 10/03/2012 [facility] Patient Information 2015 [place], [place]. This information is not intended to replace advice given to you by your health care provider. Make sure you discuss any questions you have with your health care provider.  Endometriosis (Endometriosis) La endometriosis es una enfermedad en la que el tejido que rodea al tero (endometrio) crece fuera de su ubicacin normal. El tejido puede crecer en muchos lugares cerca del tero, pero comnmente crece en los ovarios, las trompas de [place], la vagina o el intestino. Dado que el tero expulsa o desprende su revestimiento en cada ciclo menstrual, hay sangrado en el lugar donde se localiza el tejido endometrial. Esto puede causar dolor porque la sangre es irritante para los tejidos que no estn normalmente expuestos a Librarian, academic.  CAUSAS  Se desconoce la causa de la  endometriosis.  SIGNOS Y SNTOMAS  A menudo, no hay sntomas. Cuando se presentan sntomas, estos pueden variar segn la ubicacin del tejido desplazado. Pueden ocurrir diversos sntomas en diferentes momentos. Aunque los sntomas se producen principalmente durante el perodo menstrual de [place], tambin pueden aparecer en la mitad del [place], y generalmente terminan con la menopausia. Algunas personas pueden pasar meses sin experimentar ningn tipo de sntomas. Los sntomas pueden incluir:   Dolor abdominal o en la espalda.  Sangrado ms abundante durante los perodos [employer].  [place].  Dolor al defecar.  Infertilidad. DIAGNSTICO  El mdico le preguntar acerca de sus sntomas y le har un examen fsico. Se pueden realizar varios estudios, por ejemplo:   Anlisis de [place] y [place]. Estos se realizan para ayudar a Sport and exercise psychologist.  Ecografas. Este estudio se realiza para observar el tejido anormal.  Radiografa del recto (enema de bario).  Laparoscopia. En este procedimiento, se introduce en el abdomen un tubo delgado, que emite luz y tiene una pequea cmara en el extremo (laparoscopio). Esto ayuda a que su mdico observe el tejido anormal para confirmar el diagnstico. El mdico tambin puede tomar una pequea [place] de tejido anormal (biopsia) que encuentra. Posteriormente esta muestra de tejido se enva a un laboratorio para  examinarlo con un microscopio. Aucilla y puede incluir lo siguiente:   Medicamentos para Best boy. Los antiinflamatorios no esteroides Dayna Ramus) son un tipo de analgsico que pueden ayudar a Best boy causado por la endometriosis.  Terapia hormonal. Cuando se use la terapia hormonal, se eliminan los perodos menstruales. Esto elimina la exposicin mensual a la sangre por el tejido endometrial desplazado.  Ciruga. Algunas veces puede hacerse una ciruga para extirpar el  tejido endometrial anormal. Cuando los casos son graves, puede realizarse una ciruga para extirpar las trompas de [place], el tero y los ovarios (histerectoma). [place] todos los [employer] se lo haya indicado el mdico. No tome aspirina porque este medicamento puede aumentar el sangrado cuando no recibe terapia hormonal.  Evite actividades que produzcan dolor, incluida la actividad sexual. SOLICITE ATENCIN MDICA SI:  Tiene dolor plvico durante los perodos menstruales y antes y despus de [place].  Siente dolor plvico [employer] perodos menstruales que empeora durante el perodo.  Experimenta dolor plvico durante la actividad sexual o despus de [place].  Siente dolor plvico al defecar u orinar, especialmente durante el perodo menstrual.  Tiene dificultad para quedar embarazada.  Tiene fiebre. SOLICITE ATENCIN MDICA DE INMEDIATO SI:   El dolor es intenso y no responde a los analgsicos.  Siente nuseas y vmitos intensos, o no puede [employer].  Tiene dolor que se limita a la parte inferior derecha del abdomen.  Presenta hinchazn o aumento del dolor en el abdomen.  Observa sangre en la materia fecal. ASEGRESE DE QUE:   Comprende estas instrucciones.  Controlar su afeccin.  Recibir ayuda de inmediato si no mejora o si empeora. Document Released: 06/08/2005 Document Revised: 10/23/2013 [facility] Patient Information 2015 [place], [place]. This information is not intended to replace advice given to you by your health care provider. Make sure you discuss any questions you have with your health care provider. Vancomycin capsules Qu es este medicamento? La VANCOMICINA es un antibitico glicopptido. Se utiliza en el tratamiento de ciertos tipos de infecciones bacterianas de los intestinos. No es efectivo para resfros, gripe u otras infecciones de origen viral. Este medicamento puede ser utilizado para otros  usos; si tiene alguna pregunta consulte con su proveedor de atencin mdica o con su farmacutico. MARCAS COMERCIALES DISPONIBLES: Aurelio Jew le debo informar a mi profesional de la salud antes de tomar este medicamento? Necesita saber si usted presenta alguno de los siguientes problemas o situaciones: -enfermedad intestinal, estomacal -enfermedad renal -una reaccin alrgica o inusual a la vancomicina, a otros medicamentos, alimentos, colorantes o conservantes -si est embarazada o buscando quedar embarazada -si est amamantando a un beb Cmo debo utilizar este medicamento? Tome este medicamento por va oral con un vaso de agua. Siga las instrucciones de la etiqueta del [place]. Tome sus dosis a intervalos regulares. No tome su medicamento con una frecuencia mayor a la indicada. Complete todas las dosis de su medicamento como se le haya indicado aun si se siente mejor. No omita ninguna dosis o suspenda el uso de su medicamento antes de lo indicado. Hable con su pediatra para informarse acerca del uso de este medicamento en nios. Puede requerir atencin especial. Sobredosis: Pngase en contacto inmediatamente con un centro toxicolgico o una sala de urgencia si usted cree que haya tomado demasiado medicamento. ATENCIN: [employer] es solo para usted. No comparta este medicamento con nadie. Qu sucede si me olvido de una dosis? Si  olvida una dosis, tmela lo antes posible. Si es casi la hora de la prxima dosis, tome slo esa dosis. No tome dosis adicionales o dobles. Qu puede interactuar con este medicamento? -pldoras anticonceptivas -colestiramina -colestipol -inyeccin de vancomicina Puede ser que esta lista no menciona todas las posibles interacciones. Informe a su profesional de [employer]	[place] de [employer] Corporation productos a base de hierbas, medicamentos de [place] o suplementos nutritivos que est tomando. Si usted fuma, consume bebidas alcohlicas o si utiliza drogas ilegales,  indqueselo tambin a su profesional de [employer]	[place]. Algunas sustancias pueden interactuar con su medicamento. A qu debo estar atento al usar Coca[employer]Cola? Si los sntomas no mejoran o desarrolla sntomas nuevas, consulte a su mdico o a su profesional de [employer]	[place]. Evite tomar [employer] de 3 a 4 horas de tomar colestiramina o colestipol. Qu efectos secundarios puedo tener al [employer] Corporation este medicamento? Efectos secundarios que debe informar a su mdico o a Barrister's clerk de la salud tan pronto como sea posible: -Chief of Staff como erupcin cutnea, picazn o urticarias, hinchazn de la cara, labios o lengua -dificultad al respirar -cambios en color o en volumen de orina -cambios en audicin -mareos -fiebre, infeccin -enrojecimiento, formacin de ampollas, descamacin o distensin de la piel, inclusive dentro de la boca -sangrado, magulladuras inusuales -cansancio o debilidad inusual Efectos secundarios que, por lo general, no requieren atencin mdica (debe informarlos a su mdico o a su profesional de la salud si persisten o si son molestos): -nuseas, vmito -calambres estomacales Puede ser que esta lista no menciona todos los posibles efectos secundarios. Comunquese a su mdico por asesoramiento mdico Humana Inc. Usted puede informar los efectos secundarios a la FDA por telfono al 1-800-FDA-1088. Dnde debo guardar mi medicina? Mantngala fuera del alcance de los nios. Gurdela a [employer], entre 15 y 44 grados C (78 y 36 grados F). Deseche todo el medicamento que no haya utilizado, despus de la fecha de vencimiento. ATENCIN: Este folleto es un resumen. Puede ser que no cubra toda la posible informacin. Si usted tiene preguntas acerca de esta medicina, consulte con su mdico, su farmacutico o su profesional de Technical sales engineer.  2015, Elsevier/[employer] Standard. (2013-04-10 18:16:41)

## 2015-01-04 NOTE — Progress Notes (Signed)
   Patient is a 44 year old who presented to the office today complaining of right lower quadrant pain that she's been experiencing for over a year now which she has been evaluated for the past. Patient also has been complaining of watery diarrhea for several weeks. Patient states also that her husband at times as had diarrhea that they have well water. Review of her records indicated that she was seen in the emergency room June 5 complaining of pelvic pain for the previous 10 days. In the emergency room and had done a conference metabolic panel along with a CBC which was normal. Also she had an RPR and HIV which were negative and had a wet prep that showed few yeast and moderate amount of white blood cells. She used Terazol vaginal cream on June 14 and today denies any vaginal discharge. Shortly thereafter she had gone to an urgent care whereby they had placed her on Cipro and Diflucan because of similar complaint.  Patient was seen here in the office last on October 2015 for follow-up on her left ovarian cyst. Ultrasound last year demonstrated completely resolution of the ovarian cyst normal size uterus. 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 Patient currently on continuous oral contraception pill and she withdrawals every 4 months. Patient has had history of hyperprolactinemia no longer on medication last prolactin level less than 12 months ago was normal. She is currently on no other medication.  Exam: Blood pressure 126/80  weight 194 pounds   height 5 feet 4 inches tall  BMI 33.30 kg/m Gen. appearance: Well-developed well-nourished female complaining of diarrhea and right lower abdominal pain. Abdomen: Soft tenderness in the right lower quadrant Pelvic: Bartholin urethra Skene was within normal limits Vagina: No lesions or discharge Cervix:  No lesions or discharge Uterus: Anteverted normal size shape and consistency Adnexa no palpable masses or tenderness Right adnexa tenderness present on bimanual exam with some guarding assessment of the right ovary incomplete Rectal exam: Not done  Ultrasound today: Uterus measured 8.4 x 5.3 x 3.2 cm with endometrial stripe of 5.9 mm. Right ovary normal. Left ovary copiously luteum cyst measuring 19 x 15 x 14 mm. Low level echoes color flow was noted periphery. No fluid in the cul-de-sac.  Assessment/plan: #1 diarrhea patient brought specimen we'll submit forced oral for ongoing parasite as well as for C. difficile. She'll be instructed to maintain hydration and soft diet for the next 24-48 hours and to drink Gatorade for electrolyte balance. She will buy over-the-counter Imodium to take with each loose stool. I have recommended that she contact the individuals who service are well at home for the water to be tested. #2 the following labs were ordered: CBC, comprehensive metabolic panel as a result of her diarrhea. #3 patient will be started on Flagyl 500 mg 3 times a day for 10 days in  the event of C. difficile organism contributing to her diarrhea since she had been on Cipro and Diflucan not too long ago. #4 patient to return back to the office in 2-3 weeks to discuss laparoscopy as a result of her chronic right lower quadrant pain in the event that there may be evidence of endometriosis and plan on a right salpingo-oophorectomy and left salpingectomy.

## 2015-01-05 LAB — C. DIFFICILE GDH AND TOXIN A/B
C. difficile GDH: NOT DETECTED
C. difficile Toxin A/B: NOT DETECTED

## 2015-01-07 LAB — OVA AND PARASITE EXAMINATION: OP: NONE SEEN

## 2015-01-30 ENCOUNTER — Other Ambulatory Visit (HOSPITAL_COMMUNITY)
Admission: RE | Admit: 2015-01-30 | Discharge: 2015-01-30 | Disposition: A | Discharge status: Home / Self Care | Payer: BLUE CROSS/BLUE SHIELD | Source: Ambulatory Visit | Attending: Gynecology | Admitting: Gynecology

## 2015-01-30 ENCOUNTER — Ambulatory Visit (INDEPENDENT_AMBULATORY_CARE_PROVIDER_SITE_OTHER): Payer: BLUE CROSS/BLUE SHIELD | Admitting: Gynecology

## 2015-01-30 ENCOUNTER — Encounter: Payer: Self-pay | Admitting: Gynecology

## 2015-01-30 VITALS — BP 126/80

## 2015-01-30 DIAGNOSIS — Z01419 Encounter for gynecological examination (general) (routine) without abnormal findings: Secondary | ICD-10-CM | POA: Insufficient documentation

## 2015-01-30 DIAGNOSIS — Z1151 Encounter for screening for human papillomavirus (HPV): Secondary | ICD-10-CM | POA: Insufficient documentation

## 2015-01-30 DIAGNOSIS — E229 Hyperfunction of pituitary gland, unspecified: Secondary | ICD-10-CM | POA: Diagnosis not present

## 2015-01-30 DIAGNOSIS — R7989 Other specified abnormal findings of blood chemistry: Secondary | ICD-10-CM

## 2015-01-30 MED ORDER — NORETHINDRONE ACET-ETHINYL EST 1-20 MG-MCG PO TABS: 1.0000 | ORAL_TABLET | Freq: Every day | ORAL | Status: DC

## 2015-01-30 NOTE — Progress Notes (Signed)
Kristie Hill 11-10-1970 [id number]   History:    44 y.o.  for annual gyn exam with no complaints today. Patient was seen the office on July 15 of this year with complaints of abdominal pains and diarrhea see previous note for details. Her recent culture for ova and parasite as well as for C. difficile were negative. Her CBC and comprehensive metabolic panel and urinalysis were all normal at that time. As we waited for the culture she had been placed on Flagyl 500 mg 3 times a day for 10 days. Another provider had previously to that time had on Cipro and Diflucan. Patient is otherwise asymptomatic. Patient with no prior history of any abnormal Pap smears in the past on that office visit in July 15 her ovaries were normal uterus was normal size as well. Patient's currently on Junel 1/20 continues oral contraception pill and withdrawals every 3 months. Patient's last mammogram was in 2013. In 2013 patient had laparoscopic right ovarian cystectomy for serous cystadenoma.  Past medical history,surgical history, family history and social history were all reviewed and documented in the EPIC chart.  Gynecologic History Patient's last menstrual period was 12/29/2014. Contraception: OCP (estrogen/progesterone) Last Pap: 2013. Results were: normal Last mammogram: 2013. Results were: normal  Obstetric History OB History  Gravida Para Term Preterm AB SAB TAB Ectopic Multiple Living  5    5 5     0    # Outcome Date GA Lbr Len/2nd Weight Sex Delivery Anes PTL Lv  5 SAB           4 SAB           3 SAB           2 SAB           1 SAB                ROS: A ROS was performed and pertinent positives and negatives are included in the history.  GENERAL: No fevers or chills. HEENT: No change in vision, no earache, sore throat or sinus congestion. NECK: No pain or stiffness. CARDIOVASCULAR: No chest pain or pressure. No palpitations. PULMONARY: No shortness of breath, cough or wheeze.  GASTROINTESTINAL: No abdominal pain, nausea, vomiting or diarrhea, melena or bright red blood per rectum. GENITOURINARY: No urinary frequency, urgency, hesitancy or dysuria. MUSCULOSKELETAL: No joint or muscle pain, no back pain, no recent trauma. DERMATOLOGIC: No rash, no itching, no lesions. ENDOCRINE: No polyuria, polydipsia, no heat or cold intolerance. No recent change in weight. HEMATOLOGICAL: No anemia or easy bruising or bleeding. NEUROLOGIC: No headache, seizures, numbness, tingling or weakness. PSYCHIATRIC: No depression, no loss of interest in normal activity or change in sleep pattern.     Exam: chaperone present  BP 126/80 mmHg  LMP 12/29/2014  There is no weight on file to calculate BMI.  General appearance : Well developed well nourished female. No acute distress HEENT: Eyes: no retinal hemorrhage or exudates,  Neck supple, trachea midline, no carotid bruits, no thyroidmegaly Lungs: Clear to auscultation, no rhonchi or wheezes, or rib retractions  Heart: Regular rate and rhythm, no murmurs or gallops Breast:Examined in sitting and supine position were symmetrical in appearance, no palpable masses or tenderness,  no skin retraction, no nipple inversion, no nipple discharge, no skin discoloration, no axillary or supraclavicular lymphadenopathy Abdomen: no palpable masses or tenderness, no rebound or guarding Extremities: no edema or skin discoloration or tenderness  Pelvic:  Bartholin, Urethra, Skene Glands: Within normal  limits             Vagina: No gross lesions or discharge  Cervix: No gross lesions or discharge  Uterus  anteverted, normal size, shape and consistency, non-tender and mobile  Adnexa  Without masses or tenderness  Anus and perineum  normal   Rectovaginal  normal sphincter tone without palpated masses or tenderness             Hemoccult not indicated     Assessment/Plan:  44 y.o. female for annual exam doing well today. Patient was provided with a  requisition to schedule her mammogram. Since she had dense breasts have recommended she request a three-dimensional mammogram. She recently had all her blood work which was normal with the exception of lipid profile which she will return in a fasting state later this week. Patient several years ago had hyperprolactinemia currently on no medication will check also prolactin level as well. Patient was reminded to do her monthly breast exam. Her Pap smear was done today. We will otherwise see her back in one year or when necessary.   Terrance Mass MD, 4:27 PM 01/30/2015

## 2015-02-01 LAB — CYTOLOGY - PAP

## 2015-02-07 ENCOUNTER — Other Ambulatory Visit: Payer: BLUE CROSS/BLUE SHIELD

## 2015-02-07 DIAGNOSIS — Z01419 Encounter for gynecological examination (general) (routine) without abnormal findings: Secondary | ICD-10-CM

## 2015-02-07 DIAGNOSIS — R7989 Other specified abnormal findings of blood chemistry: Secondary | ICD-10-CM

## 2015-02-07 DIAGNOSIS — E229 Hyperfunction of pituitary gland, unspecified: Secondary | ICD-10-CM

## 2015-02-07 LAB — LIPID PANEL
Cholesterol: 180 mg/dL (ref 125–200)
HDL: 62 mg/dL (ref >=46)
LDL Cholesterol: 103 mg/dL (ref <130)
Total CHOL/HDL Ratio: 2.9 Ratio (ref <=5.0)
Triglycerides: 76 mg/dL (ref <150)
VLDL: 15 mg/dL (ref <30)

## 2015-02-08 LAB — PROLACTIN: PROLACTIN: 5.3 ng/mL

## 2015-03-15 ENCOUNTER — Other Ambulatory Visit: Payer: Self-pay

## 2015-03-15 DIAGNOSIS — Z1231 Encounter for screening mammogram for malignant neoplasm of breast: Secondary | ICD-10-CM

## 2015-03-22 ENCOUNTER — Encounter: Payer: Self-pay | Admitting: Gynecology

## 2015-06-25 IMAGING — US US PELVIS COMPLETE
1 series · 14 of 25 positions shown · non-contrast
Comparison: None

CLINICAL DATA: History of ovarian cysts.  RIGHT adnexal pain.



[Series 1: us pelvis complete · 0.20mm/px · 14 of 68 slices shown]
[im 1/68]
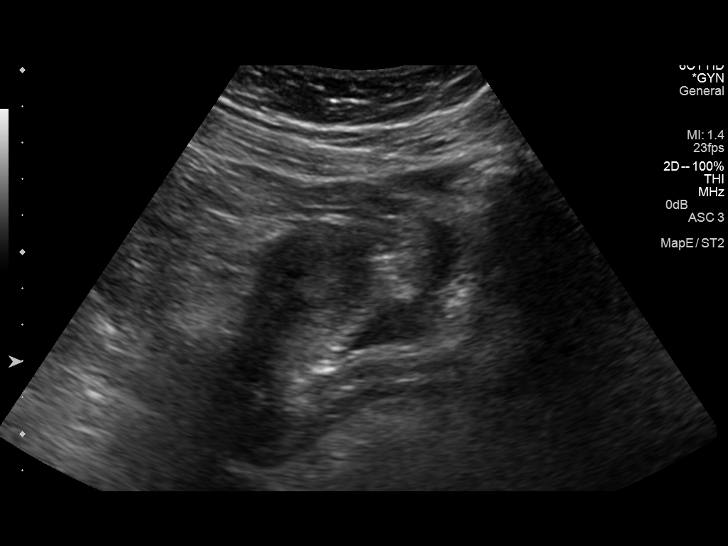
[im 6/68]
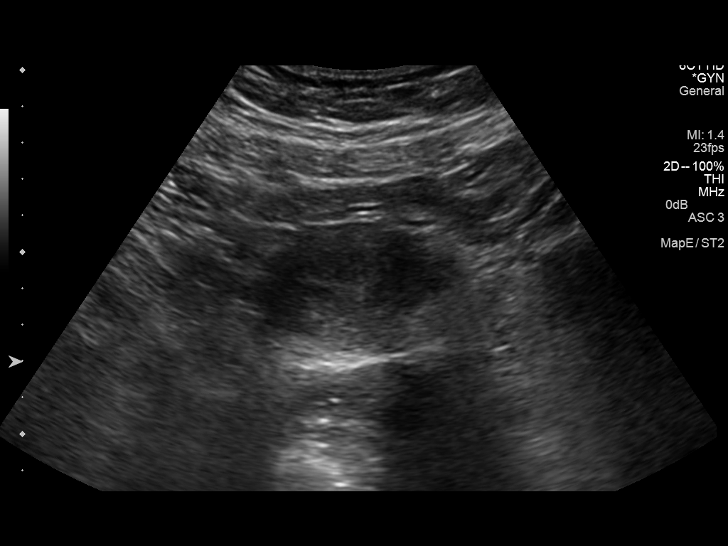
[im 12/68]
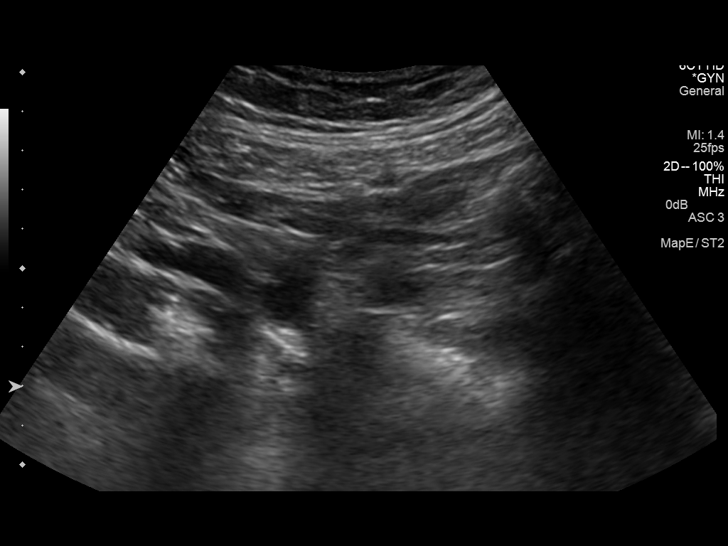
[im 17/68]
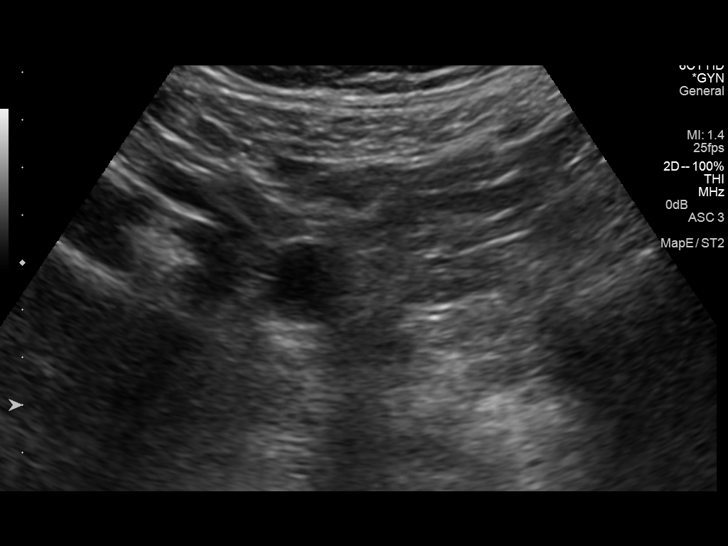
[im 23/68]
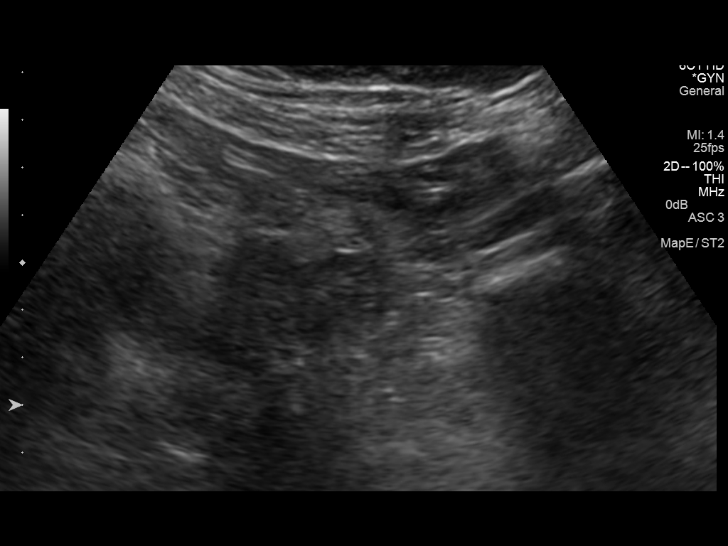
[im 26/68]
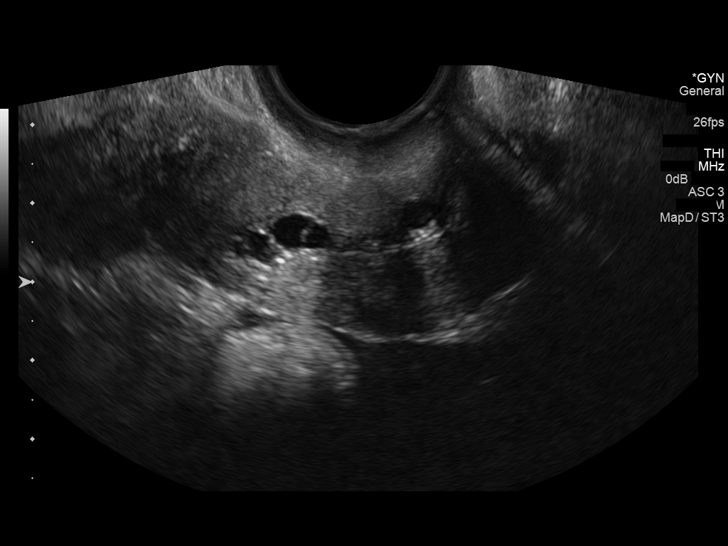
[im 31/68]
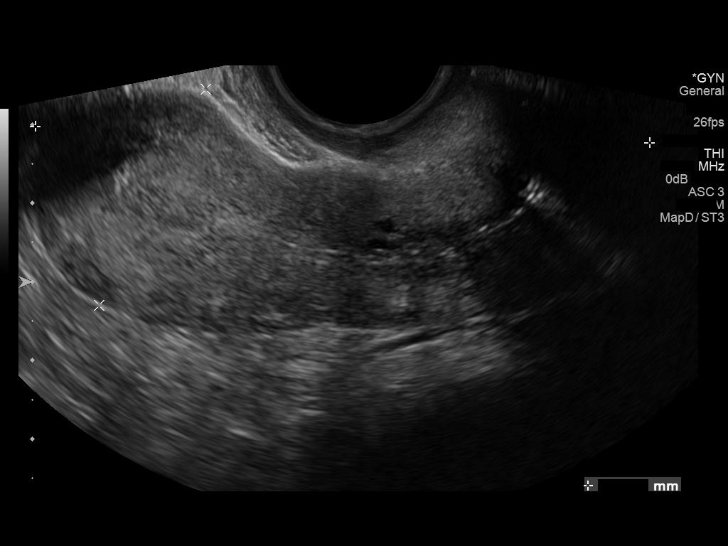
[im 37/68]
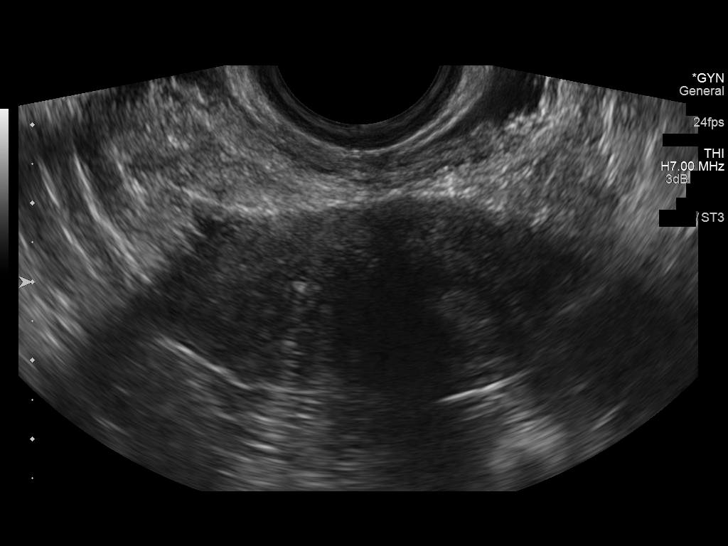
[im 42/68]
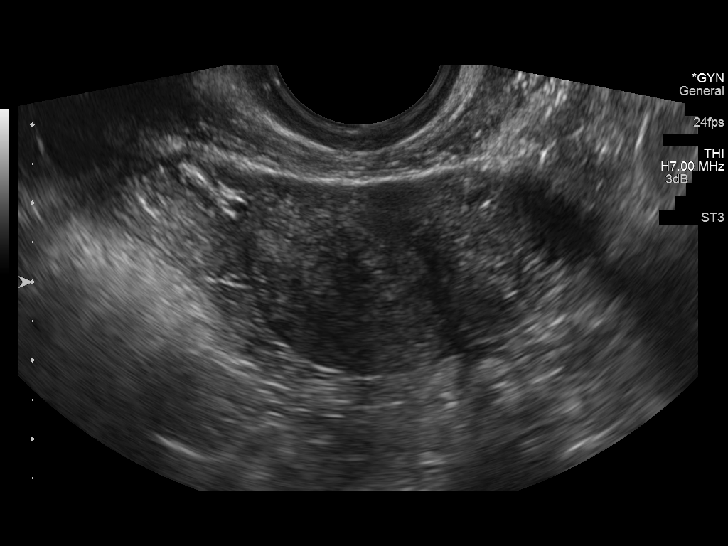
[im 45/68]
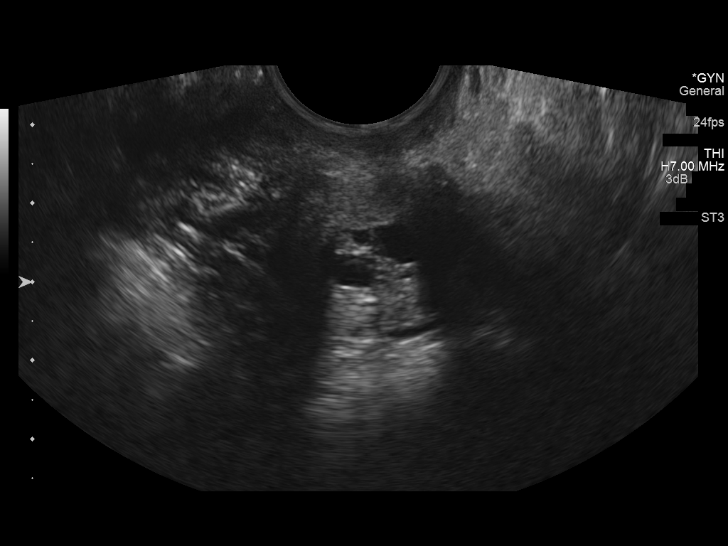
[im 51/68]
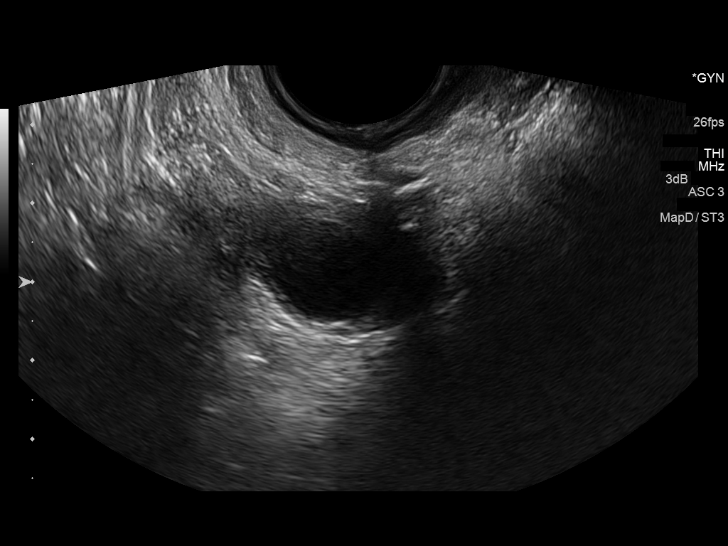
[im 56/68]
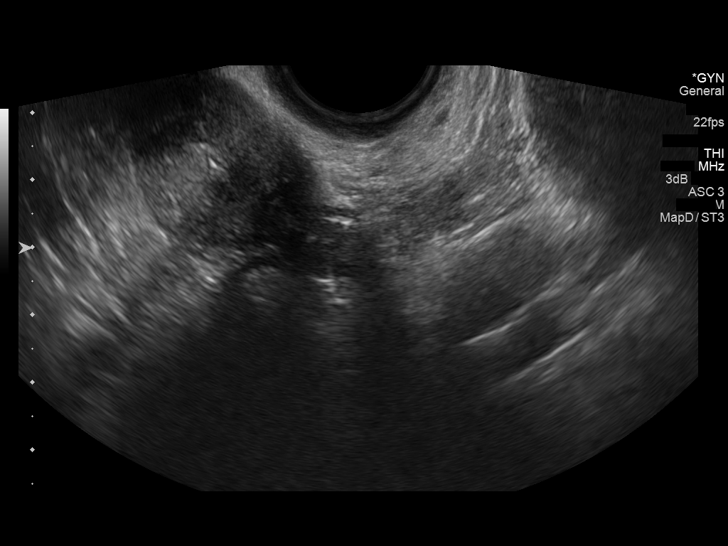
[im 62/68]
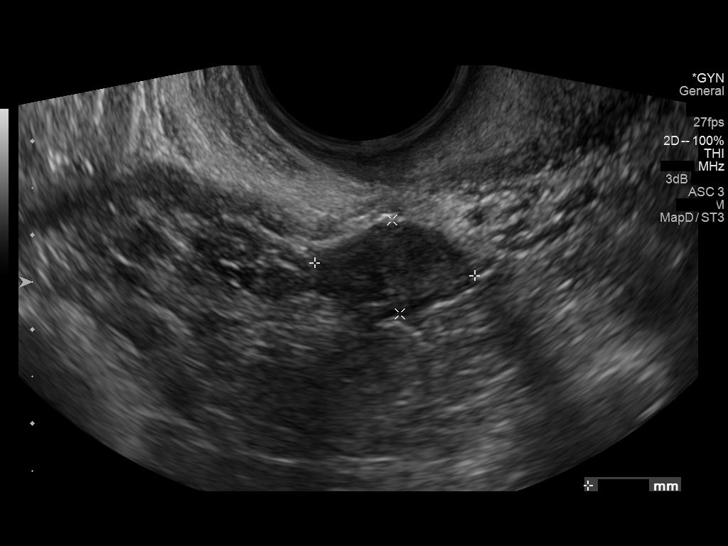
[im 68/68]
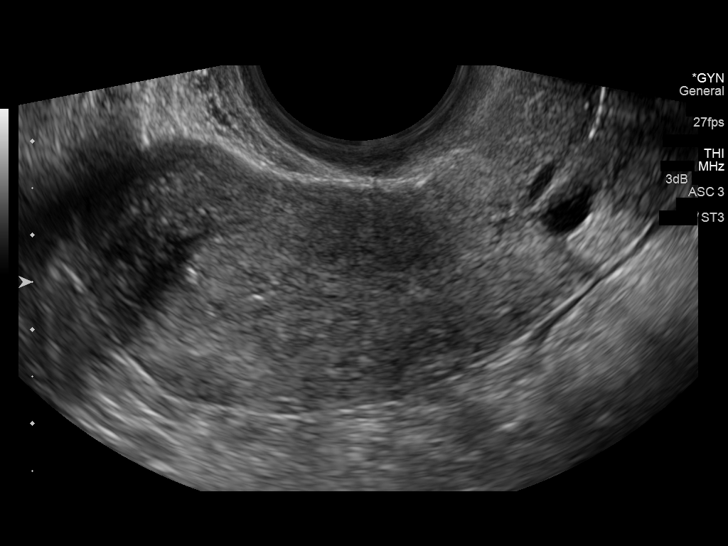

[14 of 25 positions shown; findings below may reference images not displayed]

FINDINGS: Uterus

Measurements: 7.3 x 3.1 x 5.0 cm. No fibroids or other mass
visualized.

Endometrium

Thickness: 8.8 mm.  No focal abnormality visualized.

Right ovary

Measurements: 2.3 x 1.6 x 1.7 cm. Normal appearance/no adnexal mass.

Left ovary

Measurements: 1 point some of 1.0 x 1.1 cm. Normal appearance/no
adnexal mass.

Other findings

No free fluid.  Multiple nabothian cysts.
IMPRESSION: Unremarkable transabdominal and transvaginal ultrasound of the
pelvis.

## 2015-07-05 ENCOUNTER — Ambulatory Visit (INDEPENDENT_AMBULATORY_CARE_PROVIDER_SITE_OTHER): Payer: BLUE CROSS/BLUE SHIELD | Admitting: Family Medicine

## 2015-07-05 VITALS — BP 134/90 | HR 93 | Temp 98.3°F | Resp 20 | Ht 65.5 in | Wt 198.6 lb

## 2015-07-05 DIAGNOSIS — G44219 Episodic tension-type headache, not intractable: Secondary | ICD-10-CM | POA: Diagnosis not present

## 2015-07-05 DIAGNOSIS — R030 Elevated blood-pressure reading, without diagnosis of hypertension: Secondary | ICD-10-CM | POA: Diagnosis not present

## 2015-07-05 DIAGNOSIS — S46811A Strain of other muscles, fascia and tendons at shoulder and upper arm level, right arm, initial encounter: Secondary | ICD-10-CM

## 2015-07-05 DIAGNOSIS — IMO0001 Reserved for inherently not codable concepts without codable children: Secondary | ICD-10-CM

## 2015-07-05 LAB — POCT CBC
Granulocyte percent: 60.5 %G (ref 37–80)
HEMATOCRIT: 40 % (ref 37.7–47.9)
HEMOGLOBIN: 13.5 g/dL (ref 12.2–16.2)
Lymph, poc: 2.7 (ref 0.6–3.4)
MCH: 29.8 pg (ref 27–31.2)
MCHC: 33.7 g/dL (ref 31.8–35.4)
MCV: 88.5 fL (ref 80–97)
MID (CBC): 0.6 (ref 0–0.9)
MPV: 7.5 fL (ref 0–99.8)
PLATELET COUNT, POC: 272 10*3/uL (ref 142–424)
POC Granulocyte: 5.1 (ref 2–6.9)
POC LYMPH PERCENT: 32.1 %L (ref 10–50)
POC MID %: 7.4 %M (ref 0–12)
RBC: 4.52 M/uL (ref 4.04–5.48)
RDW, POC: 13.4 %
WBC: 8.5 10*3/uL (ref 4.6–10.2)

## 2015-07-05 LAB — COMPREHENSIVE METABOLIC PANEL
ALT: 21 U/L (ref 6–29)
AST: 17 U/L (ref 10–30)
Albumin: 4.6 g/dL (ref 3.6–5.1)
Alkaline Phosphatase: 50 U/L (ref 33–115)
BUN: 16 mg/dL (ref 7–25)
CO2: 30 mmol/L (ref 20–31)
CREATININE: 0.74 mg/dL (ref 0.50–1.10)
Calcium: 10.3 mg/dL — ABNORMAL HIGH (ref 8.6–10.2)
Chloride: 100 mmol/L (ref 98–110)
GLUCOSE: 91 mg/dL (ref 65–99)
Potassium: 4.2 mmol/L (ref 3.5–5.3)
SODIUM: 139 mmol/L (ref 135–146)
Total Bilirubin: 0.4 mg/dL (ref 0.2–1.2)
Total Protein: 7.7 g/dL (ref 6.1–8.1)

## 2015-07-05 LAB — GLUCOSE, POCT (MANUAL RESULT ENTRY): POC GLUCOSE: 104 mg/dL — AB (ref 70–99)

## 2015-07-05 MED ORDER — METHOCARBAMOL 500 MG PO TABS: 500.0000 mg | ORAL_TABLET | Freq: Three times a day (TID) | ORAL | Status: DC | PRN

## 2015-07-05 NOTE — Progress Notes (Signed)
[facility] [address] [place] [address] [phone]  Date:  07/05/2015   Name:  Kristie Hill   DOB:  Sep 19, 1970   MRN:  [id number]  PCP:  No primary care provider on file.    Chief Complaint: Hypertension; Headache; and Depression   History of Present Illness:  Brittainy Hill is a 45 y.o. very pleasant female patient who presents with the following:  Here today as a new patient with complaint of elevated BP.  Today is Friday- on Monday she checked her BP at home and it was 148/91.  She has never had any problems with her BP in the past.   Her father has HTN.  Her mother is passed away  She has noticed headaches this week- most days.   They are not as severe as the migraines that she gets sometimes.    She tried taking some tylenol for her HA.  She is not sleeping that well.    No vision change.   No chest pain.   She has a history of recurrent miscarriage  She has felt down and sad this week only- she is not aware of any trigger here .  No SI.  As above she has suffered 5 miscarriages and was tearful when discussing her most recent pregnancy loss.  This would certainly be very hard.    BP Readings from Last 3 Encounters:  07/05/15 134/90  01/30/15 126/80  01/04/15 126/80     Patient Active Problem List   Diagnosis Date Noted  . 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 Recurrent pregnancy loss   . Oligomenorrhea   . Blood type, Rh negative     O negative  . Hormone disorder 2012    hyperprolactinemia  . No pertinent past medical history     Past Surgical History  Procedure Laterality Date  . Dilation and curettage of uterus  01/07/07  . Dilation and curettage of uterus  10/01/10    D&E  . Dilation and curettage of uterus    . Laparoscopy  04/19/2012    Procedure: LAPAROSCOPY OPERATIVE;   Surgeon: Terrance Mass, MD;  Location: [facility];  Service: Gynecology;  Laterality: Right;  Excision of right para-tubal mass. Aspiration of Left ovarian cyst, pelvic washings     Social History  Substance Use Topics  . Smoking status: Never Smoker   . Smokeless tobacco: Never Used  . Alcohol Use: Yes     Comment: occ    Family History  Problem Relation Age of Onset  . Heart disease Father   . Hypertension Father   . Cancer Paternal Grandmother     BRAIN  . Cancer Paternal Grandfather     LUNG  . Hypertension Mother     No Known Allergies  Medication list has been reviewed and updated.  No current outpatient prescriptions on file prior to visit.   No current facility-administered medications on file prior to visit.    Review of Systems:  As per HPI- otherwise negative.   Physical Examination: Filed Vitals:   07/05/15 1100  BP: 134/90  Pulse: 93  Temp: 98.3 F (36.8 C)  Resp: 20   Filed Vitals:   07/05/15 1100  Height: 5' 5.5" (1.664 m)  Weight: 198 lb 9.6 oz (90.084 kg)   Body mass index is 32.53 kg/(m^2). Ideal Body Weight: Weight in (  lb) to have BMI = 25: 152.2  GEN: WDWN, NAD, Non-toxic, A & O x 3, looks well, overweight HEENT: Atraumatic, Normocephalic. Neck supple. No masses, No LAD.  Bilateral TM wnl, oropharynx normal.  PEERL,EOMI. No meningismus   Ears and Nose: No external deformity. CV: RRR, No M/G/R. No JVD. No thrill. No extra heart sounds. PULM: CTA B, no wheezes, crackles, rhonchi. No retractions. No resp. distress. No accessory muscle use. EXTR: No c/c/e.  Normal strength and movement of all extremities and facial muscles NEURO Normal gait.  PSYCH: Normally interactive. Conversant. Not depressed or anxious appearing.  Calm demeanor.  Mild tenderness in the right paracervical and trapezius muscles . She indicates this as the where most of her HA is located- she also has some HA in the front of her head   Results for orders placed or  performed in visit on 07/05/15  POCT CBC  Result Value Ref Range   WBC 8.5 4.6 - 10.2 K/uL   Lymph, poc 2.7 0.6 - 3.4   POC LYMPH PERCENT 32.1 10 - 50 %L   MID (cbc) 0.6 0 - 0.9   POC MID % 7.4 0 - 12 %M   POC Granulocyte 5.1 2 - 6.9   Granulocyte percent 60.5 37 - 80 %G   RBC 4.52 4.04 - 5.48 M/uL   Hemoglobin 13.5 12.2 - 16.2 g/dL   HCT, POC 40.0 37.7 - 47.9 %   MCV 88.5 80 - 97 fL   MCH, POC 29.8 27 - 31.2 pg   MCHC 33.7 31.8 - 35.4 g/dL   RDW, POC 13.4 %   Platelet Count, POC 272 142 - 424 K/uL   MPV 7.5 0 - 99.8 fL  POCT glucose (manual entry)  Result Value Ref Range   POC Glucose 104 (A) 70 - 99 mg/dl    Assessment and Plan: Trapezius strain, right, initial encounter - Plan: methocarbamol (ROBAXIN) 500 MG tablet  Elevated BP - Plan: POCT glucose (manual entry), Comprehensive metabolic panel, TSH  Episodic tension-type headache, not intractable - Plan: POCT CBC  Reassured that at this time her BP is reasonable.  I would not start a BP medication for her at this point.  However encouraged her to continue to monitor her BP at home and to let me know if it is running over 140/90 on a regualr basis Will use robaxin for trapezius pain that is likely contributing to her HA  Signed Lamar Blinks, MD

## 2015-07-05 NOTE — Patient Instructions (Signed)
Your blood pressure looks ok today!   Please check it once or twice a week at home and let me know if it is running over 140/90 on a regular basis.   In that case we may need to consider using a blood pressure medication  For the time being try the robaxin for your head and neck pain- remember this can make you a bit sleepy  If is ok to use tylenol and heat also for your pain  Let me know if you do not feel better soon!

## 2015-07-06 ENCOUNTER — Encounter: Payer: Self-pay | Admitting: Family Medicine

## 2015-07-06 LAB — TSH: TSH: 1.313 u[IU]/mL (ref 0.350–4.500)

## 2015-07-09 ENCOUNTER — Ambulatory Visit (INDEPENDENT_AMBULATORY_CARE_PROVIDER_SITE_OTHER): Payer: BLUE CROSS/BLUE SHIELD | Admitting: Physician Assistant

## 2015-07-09 VITALS — BP 126/90 | HR 95 | Temp 98.6°F | Resp 16 | Ht 65.0 in | Wt 201.0 lb

## 2015-07-09 DIAGNOSIS — I10 Essential (primary) hypertension: Secondary | ICD-10-CM

## 2015-07-09 MED ORDER — AMLODIPINE BESYLATE 2.5 MG PO TABS: 2.5000 mg | ORAL_TABLET | Freq: Every day | ORAL | Status: DC

## 2015-07-09 NOTE — Progress Notes (Signed)
[facility] [address], [place] [address] [phone]  Date:  07/09/2015   Name:  Kristie Hill   DOB:  1970/07/16   MRN:  [id number]  PCP:  No primary care provider on file.    Chief Complaint: Follow-up   History of Present Illness:  This is a 45 y.o. female with PMH amenorrhea-prolactinemia syndrome and recurrent miscarriages who is presenting with elevated BP. She was seen here 07/05/15 with same complaint and saw Dr. Lorelei Pont. She states for the past 2 weeks she has been experiencing headaches in bilateral temples and has tinnitus. She started checking her BP after 1 week of headaches and noticed it was high around 150/90. BP in office at that time 134/90. She was given robaxin for possible tension type headache. Pt states this has not helped and her symptoms have continued. She bought a BP monitor with arm cuff at [facility]. She has been taking her BP each morning. She states ranging 148-160/90-100. She is very worried about her BP. Her father has HTN. She denies cp, sob, blurred vision, le edema.  Takes microgestin for birth control.  Review of Systems:  Review of Systems See HPI  Patient Active Problem List   Diagnosis Date Noted  . 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 Authorizing Provider  methocarbamol (ROBAXIN) 500 MG tablet Take 1 tablet (500 mg total) by mouth every 8 (eight) hours as needed. Patient not taking: Reported on 07/09/2015 07/05/15   Darreld Mclean, MD    No Known Allergies  Past Surgical History  Procedure Laterality Date  . Dilation and curettage of uterus  01/07/07  . Dilation and curettage of uterus  10/01/10    D&E  . Dilation and curettage of uterus    . Laparoscopy  04/19/2012    Procedure: LAPAROSCOPY OPERATIVE;  Surgeon: Terrance Mass, MD;   Location: [facility];  Service: Gynecology;  Laterality: Right;  Excision of right para-tubal mass. Aspiration of Left ovarian cyst, pelvic washings     Social History  Substance Use Topics  . Smoking status: Never Smoker   . Smokeless tobacco: Never Used  . Alcohol Use: Yes     Comment: occ    Family History  Problem Relation Age of Onset  . Heart disease Father   . Hypertension Father   . Cancer Paternal Grandmother     BRAIN  . Cancer Paternal Grandfather     LUNG  . Hypertension Mother     Medication list has been reviewed and updated.  Physical Examination:  Physical Exam  Constitutional: She is oriented to person, place, and time. She appears well-developed and well-nourished. No distress.  HENT:  Head: Normocephalic and atraumatic.  Right Ear: Hearing normal.  Left Ear: Hearing normal.  Nose: Nose normal.  Eyes: Conjunctivae, EOM and lids are normal. Pupils are equal, round, and reactive to light. Right eye exhibits no discharge. Left eye exhibits no discharge. No scleral icterus.  Cardiovascular: Normal rate, regular rhythm, normal heart sounds and normal pulses.   No murmur heard. Pulmonary/Chest: Effort normal and breath sounds normal. No respiratory distress. She has no wheezes. She has no rhonchi. She has no rales.  Musculoskeletal: Normal range of motion.  Lymphadenopathy:       Head (right side): No submental, no submandibular and no tonsillar adenopathy present.  Head (left side): No submental, no submandibular and no tonsillar adenopathy present.    She has no cervical adenopathy.  Neurological: She is alert and oriented to person, place, and time.  Skin: Skin is warm, dry and intact. No lesion and no rash noted.  Psychiatric: She has a normal mood and affect. Her speech is normal and behavior is normal. Thought content normal.   BP 126/90 mmHg  Pulse 95  Temp(Src) 98.6 F (37 C)  Resp 16  Ht 5\' 5"  (1.651 m)  Wt 201 lb (91.173 kg)  BMI 33.45  kg/m2  SpO2 96%  Assessment and Plan:  1. Essential hypertension BP only very mildly elevated in office. Home readings higher. Pt very worried about her BP. Gave small dose amlodipine. Counseled on side effects. Advised she continue to take BP and keep record. Return in 1 month to see either me or Dr. Lorelei Pont to follow up. - amLODipine (NORVASC) 2.5 MG tablet; Take 1 tablet (2.5 mg total) by mouth daily.  Dispense: 30 tablet; Refill: 5   Nicole V. Drenda Freeze, MHS [facility]  07/11/2015

## 2015-07-09 NOTE — Patient Instructions (Signed)
toma amlodipine en la manana llamame si efectos secundarios toma tu presion cada dia y escriba en una papel y traer en un mes. regresa in un mes

## 2015-10-21 ENCOUNTER — Encounter: Payer: Self-pay | Admitting: Gynecology

## 2015-10-21 ENCOUNTER — Ambulatory Visit (INDEPENDENT_AMBULATORY_CARE_PROVIDER_SITE_OTHER): Payer: BLUE CROSS/BLUE SHIELD | Admitting: Gynecology

## 2015-10-21 VITALS — BP 132/86

## 2015-10-21 DIAGNOSIS — R102 Pelvic and perineal pain: Secondary | ICD-10-CM | POA: Diagnosis not present

## 2015-10-21 DIAGNOSIS — N898 Other specified noninflammatory disorders of vagina: Secondary | ICD-10-CM

## 2015-10-21 LAB — WET PREP FOR TRICH, YEAST, CLUE
CLUE CELLS WET PREP: NONE SEEN
TRICH WET PREP: NONE SEEN

## 2015-10-21 MED ORDER — FLUCONAZOLE 150 MG PO TABS: 150.0000 mg | ORAL_TABLET | Freq: Once | ORAL | Status: DC

## 2015-10-21 MED ORDER — NORETHIN ACE-ETH ESTRAD-FE 1-20 MG-MCG PO TABS: 1.0000 | ORAL_TABLET | Freq: Every day | ORAL | Status: DC

## 2015-10-21 MED ORDER — METRONIDAZOLE 500 MG PO TABS: 500.0000 mg | ORAL_TABLET | Freq: Two times a day (BID) | ORAL | Status: DC

## 2015-10-21 NOTE — Progress Notes (Signed)
   HPI: Patient is a 45 year old presented to the office with several complaints today. She was complaining of a vaginal discharge. Some nonspecific abdominal discomfort with some bloating sensation reported. She also describes the discomfort sometimes radiating to her back. Patient denies any fever, chills, nausea, or vomiting. Patient with no GU or any GI complaints reported. She is in a monogamous relationship. Patient last year had complained a similar symptoms although she had reported some diarrhea as well and she had a negative culture for ova and parasite and also her C. difficile was negative. Patient is currently on Junel 1/20 and withdrawals every 3 months.In 2013 patient had laparoscopic right ovarian cystectomy for serous cystadenoma.   ROS: A ROS was performed and pertinent positives and negatives are included in the history.  GENERAL: No fevers or chills. HEENT: No change in vision, no earache, sore throat or sinus congestion. NECK: No pain or stiffness. CARDIOVASCULAR: No chest pain or pressure. No palpitations. PULMONARY: No shortness of breath, cough or wheeze. GASTROINTESTINAL: Abdominal bloating and discomfort on and off at times no, vomiting or diarrhea, melena or bright red blood per rectum. GENITOURINARY: Some urinary frequency, no urgency, hesitancy or dysuria. MUSCULOSKELETAL: No joint or muscle pain, low back discomfort at times, no recent trauma. DERMATOLOGIC: No rash, no itching, no lesions. ENDOCRINE: No polyuria, polydipsia, no heat or cold intolerance. No recent change in weight. HEMATOLOGICAL: No anemia or easy bruising or bleeding. NEUROLOGIC: No headache, seizures, numbness, tingling or weakness. PSYCHIATRIC: No depression, no loss of interest in normal activity or change in sleep pattern.   PE: Gen. appearance well-developed well-nourished female in no acute distress Back: No CVA tenderness Abdomen: Soft nontender no rebound no guarding. Negative Rovsing sign Pelvic:  Bartholin urethra Skene was within normal limits Vagina: No lesions or discharge Cervix: No lesions or discharge Uterus: Anteverted normal size shape and consistency Adnexa: No palpable masses or tenderness Rectal exam not done  Wet prep few yeast rare WBC and many bacteria   Assessment Plan: Patient will be treated for yeast vaginitis with Diflucan 150 mg 1 by mouth today. For her suspected concurrent BV she will be prescribed Flagyl 500 mg one by mouth twice a day for 5 days. A urine culture was submitted pending at time of this dictation. If patient's symptoms do not improve after the above recommendation she'll return to the office next week for pelvic ultrasound.     Greater than 50% of time was spent in counseling and coordinating care of this patient. Time of consultation: 15   Minutes.

## 2015-10-21 NOTE — Patient Instructions (Signed)
Fluconazole injection Qu es este medicamento? El FLUCONAZOL es un medicamento antimictico. Se utiliza para tratar o prevenir ciertos tipos de infecciones micticas o por levadura. Este medicamento puede ser utilizado para otros usos; si tiene alguna pregunta consulte con su proveedor de atencin mdica o con su farmacutico. Qu le debo informar a mi profesional de la salud antes de tomar este medicamento? Necesita saber si usted presenta alguno de los siguientes problemas o situaciones: -antecedentes de pulso cardaco irregular -enfermedad renal -una reaccin alrgica o inusual al fluconazol, a otros medicamentos antimicticos, alimentos, colorantes o conservantes -si est embarazada o buscando quedar embarazada -si est amamantando a un beb Cmo debo utilizar este medicamento? Este medicamento se administra mediante inyeccin por va intravenosa. Generalmente lo administra un profesional de Technical sales engineer en un hospital o en un entorno clnico. Si recibe este medicamento en su domicilio, se le ensearn cmo preparar y Biomedical engineer. selo exactamente como se le indique. Use sus dosis a intervalos regulares. No use su medicamento con una frecuencia mayor a la indicada. Es importante que deseche las agujas y las jeringas usadas en un recipiente resistente a los pinchazos. No las deseche en una basura. Si no tiene un recipiente resistente a los pinchazos, consulte a Midwife o su proveedor de atencin para obtenerlo. Hable con su pediatra para informarse acerca del uso de este medicamento en nios. Puede requerir atencin especial. Sobredosis: Pngase en contacto inmediatamente con un centro toxicolgico o una sala de urgencia si usted cree que haya tomado demasiado medicamento. ATENCIN: [employer] es solo para usted. No comparta este medicamento con nadie. Qu sucede si me olvido de una dosis? No se aplica en este caso. Qu puede interactuar con este  medicamento? No tome esta medicina con ninguno de los siguientes medicamentos: -astemizol -ciertos medicamentos para el pulso cardiaco irregular, tales como dofetilida, dronedarona, quinidina -cisapride -eritromicina -lomitapida -otros medicamentos que prolongan el intervalo QT (causa un ritmo cardiaco anormal) -pimozida -terfenadina -tioridazina -tolvaptn -ziprasidona Esta medicina tambin puede interactuar con los siguientes medicamentos: -medicamentos antivirales para el VIH o SIDA -pldoras anticonceptivas -ciertos antibiticos, tales como rifabutina, rifampicina -ciertos medicamentos para la presin sangunea, tales como amlodipina, isradipina, felodipino, hidroclorotiazida, losartn, nifedipina -ciertos medicamentos para el cncer, tales como ciclofosfamida, vinblastina, vincristina -ciertos medicamentos para el colesterol, tales como atorvastatina, lovastatina, fluvastatina, simvastatina -ciertos medicamentos para la depresin, ansiedad o trastornos psicticos, tales como amitriptilina, midazolam, nortriptilina, triazolam -ciertos medicamentos para la diabetes, tales como glipizida, gliburida, tolbutamida -ciertos medicamentos para Conservation officer, historic buildings, tales como alfentanilo, fentanilo, metadona -ciertos medicamentos para convulsiones, tales como carbamazepina, fenitona -ciertos medicamentos que tratan o previenen cogulos sanguneos, tales como warfarina -halofantrina -medicamentos que reducen su capacidad de combatir infecciones, tales como ciclosporina, prednisona, tacrolimo -los AINE, medicamentos para el dolor o inflamacin, tales como celecoxib, diclofenaco, flurbiprofeno, ibuprofeno, meloxicam, naproxeno -otros medicamentos para infecciones micticas -sirolims -teofilina -tofacitinib Puede ser que esta lista no menciona todas las posibles interacciones. Informe a su profesional de KB Home	[place] de [employer] productos a base de hierbas, medicamentos de [place] o suplementos  nutritivos que est tomando. Si usted fuma, consume bebidas alcohlicas o si utiliza drogas ilegales, indqueselo tambin a su profesional de KB Home	[place]. Algunas sustancias pueden interactuar con su medicamento. A qu debo estar atento al usar Coca-Cola? Si los sntomas no mejoran, consulte a Adult nurse. Si toma este medicamento durante un perodo de [employer], podr Customer service manager anlisis de [place]. Pueden transcurrir [employer] o meses de tratamiento antes de que algunas  infecciones micticas se curen. El alcohol puede aumentar la posibilidad de daos al hgado de [place]. Evite consumir bebidas alcohlicas. Qu efectos secundarios puedo tener al [employer] este medicamento? Efectos secundarios que debe informar a su mdico o a Barrister's clerk de la salud tan pronto como sea posible: -Chief of Staff como erupcin cutnea, picazn o urticarias, hinchazn de los labios, boca, lengua o garganta -orina de color amarillo oscuro -sensacin de mareos o desmayos -pulso cardiaco irregular o dolor en el pecho -dolor, enrojecimiento en el lugar de la inyeccin -enrojecimiento, formacin de ampollas, descamacin o distensin de la piel, inclusive dentro de la boca -dolor estomacal -dificultad al respirar -sangrado o magulladuras inusuales -vmito -color amarillento de los ojos o la piel Efectos secundarios que, por lo general, no requieren Geophysical data processor (debe informarlos a su mdico o a Barrister's clerk de la salud si persisten o si son molestos): -cambios en el sabor de los alimentos -diarrea -dolor de cabeza -Higher education careers adviser, nuseas Puede ser que esta lista no menciona todos los posibles efectos secundarios. Comunquese a su mdico por asesoramiento mdico [employer]. Usted puede informar los efectos secundarios a la FDA por telfono al 1-800-FDA-1088. Dnde debo guardar mi medicina? Mantngala fuera del alcance de los nios. Si est  Theatre manager en su domicilio recibir instrucciones sobre cmo guardar este medicamento. Deseche todo el medicamento que no haya utilizado, despus de la fecha de vencimiento. ATENCIN: Este folleto es un resumen. Puede ser que no cubra toda la posible informacin. Si usted tiene preguntas acerca de esta medicina, consulte con su mdico, su farmacutico o su profesional de Technical sales engineer.    2016, Elsevier/Gold Standard. (2014-07-31 00:00:00) Tinidazole tablets Qu es este medicamento? El TINIDAZOL es un medicamento antiinfeccioso. Se utiliza para tratar la amebiasis, giardiasis, tricomonosis y vaginosis. No es efectivo para resfros, gripe u otras infecciones de origen viral. Este medicamento puede ser utilizado para otros usos; si tiene alguna pregunta consulte con su proveedor de atencin mdica o con su farmacutico. Qu le debo informar a mi profesional de la salud antes de tomar este medicamento? Necesita saber si usted presenta alguno de los siguientes problemas o situaciones: -anemia u otros trastornos sanguneos -si consume bebidas alcohlicas con frecuencia -recibe hemodilisis -trastorno de convulsiones -una reaccin alrgica o inusual al tinidazol, a otros medicamentos, alimentos, colorantes o conservantes -si est embarazada o buscando quedar embarazada -si est amamantando a un beb Cmo debo utilizar este medicamento? Tome este medicamento por va oral con un vaso lleno de agua. Siga las instrucciones de la etiqueta del [place]. Tomar con alimentos. Tome sus dosis a intervalos regulares. No tome su medicamento con una frecuencia mayor a la indicada. Complete todas las dosis de su medicamento como se le haya indicado aun si se siente mejor. No omita ninguna dosis o suspenda el uso de su medicamento antes de lo indicado. Hable con su pediatra para informarse acerca del uso de este medicamento en nios. Aunque este medicamento ha sido recetado a nios tan menores como de 3  aos de edad para condiciones selectivas, las precauciones se aplican. Sobredosis: Pngase en contacto inmediatamente con un centro toxicolgico o una sala de urgencia si usted cree que haya tomado demasiado medicamento. ATENCIN: [employer] es solo para usted. No comparta este medicamento con nadie. Qu sucede si me olvido de una dosis? Si olvida una dosis, tmela lo antes posible. Si es casi la hora de la prxima dosis, tome slo esa dosis. No tome dosis adicionales o dobles.  Qu puede interactuar con este medicamento? No tome esta medicina con ninguno de los siguientes medicamentos: -alcohol o cualquier producto que contenga alcohol -solucin oral de amprenavir -disulfiram -inyeccin de paclitaxel -solucin oral de ritonavir -solucin oral de sertralina -inyeccin de sulfametoxasol-trimetoprima Esta medicina tambin puede interactuar con los siguientes medicamentos: -colestiramina -cimetidina -conivaptn -ciclosporina -fluorouracilo -fosfenitona, fenitona -quetoconazol -litio -fenobarbital -tacrolimo -warfarina Puede ser que esta lista no menciona todas las posibles interacciones. Informe a su profesional de KB Home	[place] de [employer] productos a base de hierbas, medicamentos de [place] o suplementos nutritivos que est tomando. Si usted fuma, consume bebidas alcohlicas o si utiliza drogas ilegales, indqueselo tambin a su profesional de KB Home	[place]. Algunas sustancias pueden interactuar con su medicamento. A qu debo estar atento al usar Coca-Cola? Si los sntomas no mejoran o si empeoran, consulte con su mdico o con su profesional de KB Home	[place]. Evite consumir las bebidas alcohlicas mientras toma este medicamento y durante tres das despus. El alcohol puede hacerle sentir mareado, enfermo o enrojecimiento. Si est recibiendo tratamiento para [place] enfermedad de transmisin sexual, evite todo contacto sexual hasta que haya terminado el [place]. Es posible que  su pareja tambin necesite [place]. Qu efectos secundarios puedo tener al [employer] este medicamento? Efectos secundarios que debe informar a su mdico o a Barrister's clerk de la salud tan pronto como sea posible: -Chief of Staff como erupcin cutnea, picazn o urticarias, hinchazn de la cara, labios o lengua -problemas respiratorios -confusin, depresin -manchas oscuras o blancas en la boca -sensacin de desmayos o mareos, cadas -fiebre, infeccin -entumecimiento, hormigueo, dolor o debilidad en las manos o pies -dolor al orinar -convulsiones -cansancio o debilidad inusual -irritacin o flujo vaginal -vmito Efectos secundarios que, por lo general, no requieren atencin mdica (debe informarlos a su mdico o a su profesional de la salud si persisten o si son molestos): -orina de color marrn oscuro o rojo -diarrea -dolor de cabeza -prdida del apetito -sabor metlico -nuseas -Higher education careers adviser Puede ser que esta lista no menciona todos los posibles efectos secundarios. Comunquese a su mdico por asesoramiento mdico [employer]. Usted puede informar los efectos secundarios a la FDA por telfono al 1-800-FDA-1088. Dnde debo guardar mi medicina? Mantngala fuera del alcance de los nios. Gurdela a [employer], entre 15 y 65 grados C (69 y 75 grados F). Protjala de la luz y de la humedad. Mantenga el envase bien cerrado. Deseche todo el medicamento que no haya utilizado, despus de la fecha de vencimiento. ATENCIN: Este folleto es un resumen. Puede ser que no cubra toda la posible informacin. Si usted tiene preguntas acerca de esta medicina, consulte con su mdico, su farmacutico o su profesional de Technical sales engineer.    2016, Elsevier/Gold Standard. (2014-07-31 00:00:00) Vaginitis monilisica (Monilial Vaginitis) La vaginitis es una inflamacin (irritacin, hinchazn) de la vagina y la vulva. Esta no es una enfermedad de transmisin  sexual.  CAUSAS Este tipo de vaginitis lo causa un hongo (candida) que normalmente se encuentra en la vagina. El hongo candida se ha desarrollado hasta el punto de ocasionar problemas en el equilibrio qumico. SNTOMAS  Secrecin vaginal espesa y blanca.  Hinchazn, picazn, enrojecimiento e inflamacin de la vagina y en algunos casos de los labios vaginales (vulva).  Ardor o dolor al [employer].  Dolor en [place]. DIAGNSTICO Los factores que favorecen la vaginitis moniliasica son:  Kyla Balzarine de virginidad y postmenopusicas.  Embarazo.  Infecciones.  Sentir cansancio, estar enferma o estresada, especialmente si ya ha sufrido  este problema en el pasado.  Diabetes Buen control ayudar a disminur la probabilidad.  Pldoras anticonceptivas  Ropa interior [place].  El uso de espumas de bao, aerosoles femeninos duchas vaginales o tampones con desodorante.  Algunos antibiticos (medicamentos que destruyen grmenes).  Si contrae alguna enfermedad puede sufrir recurrencias espordicas. [place] profesional que lo asiste prescribir medicamentos.  Hay diferentes tipos de cremas y supositorios vaginales que tratan especficamente la vaginitis monilisica. Para infecciones por hongos recurrentes, utilice un supositorio o crema en la vagina dos veces por semana, o segn se le indique.  Tambin podrn utilizarse cremas con corticoides o anti monilisicas para la picazn o la irritacin de la vulva. Consulte con el profesional que la asiste.  Si la crema no da resultado, podr aplicarse en la vagina una solucin con azul de metileno.  El consumo de yogur puede prevenir este tipo de vaginitis. INSTRUCCIONES [place] todos los medicamentos tal como se le indic.  No mantenga relaciones sexuales hasta que el tratamiento se haya completado, o segn las indicaciones del profesional que la asiste.  Tome baos de asiento tibios.  No se  aplique duchas vaginales.  No utilice tampones, especialmente los perfumados.  Use ropa interior de algodn  [employer] pantalones ajustados y las medias tipo panty.  Comunique a sus compaeros sexuales que sufre una infeccin por hongos. Ellos deben concurrir para un control mdico si tienen sntomas como una urticaria leve o picazn.  Sus compaeros sexuales deben tratarse tambin si la infeccin es difcil de Radiographer, therapeutic.  Practique el sexo seguro - use condones  Algunos medicamentos vaginales ocasionan fallas en los condones de ltex. Los medicamentos vaginales que pueden daar los condones son:  Building services engineer cleocina  Butoconazole (Femstat)  Terconazole (Terazol) supositorios vaginales  Miconazole (Monistat) (es un medicamento de venta libre) SOLICITE ATENCIN MDICA SI:  Waldron Session tiene una temperatura oral de ms de 38,9 C (102 F).  Si la infeccin empeora luego de 2 das de tratamiento.  Si la infeccin no mejora luego de 3 das de tratamiento.  Aparecen ampollas en o alrededor de la vagina.  Si aparece una hemorragia vaginal y no es el momento del perodo.  Siente dolor al [employer].  Presenta problemas intestinales.  Tiene dolor durante las [employer].   Esta informacin no tiene Marine scientist el consejo del mdico. Asegrese de hacerle al mdico cualquier pregunta que tenga.   Document Released: 03/18/2005 Document Revised: 08/31/2011 Elsevier Interactive Patient Education 2016 [place] oral suspension Qu es este medicamento? La BROMFENIRAMINA es un antihistamnico. Se utiliza para tratar los sntomas de alergias estacionales y Set designer de todo [place]. Tambin se [place] para tratar el goteo de nariz como consecuencia de los resfros. Este medicamento puede ser utilizado para otros usos; si tiene alguna pregunta consulte con su proveedor de atencin mdica o con su farmacutico. Qu le debo informar a mi profesional de la salud  antes de tomar este medicamento? Necesita saber si usted presenta alguno de los siguientes problemas o situaciones: -asma -glaucoma -enfermedad cardiaca -alta presin sangunea -enfermedad de prstata -lceras estomacales -si toma IMAO, tales como Carbex, Eldepryl, Marplan, Nardil o Parnate -dificultad para orinar -enfermedad tiroidea -una reaccin alrgica o inusual a la bromfeniramina, a la tartrazina, a otros medicamentos, alimentos, colorantes o conservantes -si est embarazada o buscando quedar embarazada -si est amamantando a un beb Cmo debo utilizar este medicamento? Tome este medicamento por va oral. Siga las instrucciones de la etiqueta del [place].  Agtelo bien antes de usar. Utilice una cuchara o un recipiente dosificador especialmente marcado para medir cada dosis. Si no tiene [employer], consulte a Midwife. Las cucharas domsticas no son exactas. Tome sus dosis a intervalos regulares. No tome su medicamento con una frecuencia mayor a la indicada. Hable con su pediatra para informarse acerca del uso de este medicamento en nios. Aunque este medicamento ha sido recetado a nios tan menores como de 12 meses de edad para condiciones selectivas, las precauciones se aplican. Sobredosis: Pngase en contacto inmediatamente con un centro toxicolgico o una sala de urgencia si usted cree que haya tomado demasiado medicamento. ATENCIN: [employer] es solo para usted. No comparta este medicamento con nadie. Qu sucede si me olvido de una dosis? Si olvida una dosis, tmela lo antes posible. Si es casi la hora de la prxima dosis, tome slo esa dosis. No tome dosis adicionales o dobles. Qu puede interactuar con este medicamento? No tome esta medicina con ninguno de los siguientes medicamentos: -IMAOs, tales como [place], Eldepryl, Therapist, art, Nardil y Parnate Esta medicina tambin puede interactuar con los siguientes medicamentos: -alcohol -barbitricos, como  fenobarbital -medicamentos para los espasmos de la vejiga, tales como oxibutinina, tolterodina -medicamentos para la presin sangunea -medicamentos para la depresin, ansiedad o trastornos psicticos -medicamentos para movimientos anormales o por la enfermedad de Parkinson -medicamentos para dormir -otros medicamentos para resfros, tos o alergias -algunos medicamentos para Product manager, tales como clordiacepxido, diciclomina Puede ser que esta lista no menciona todas las posibles interacciones. Informe a su profesional de KB Home	[place] de [employer] productos a base de hierbas, medicamentos de [place] o suplementos nutritivos que est tomando. Si usted fuma, consume bebidas alcohlicas o si utiliza drogas ilegales, indqueselo tambin a su profesional de KB Home	[place]. Algunas sustancias pueden interactuar con su medicamento. A qu debo estar atento al usar Coca-Cola? Visite a su mdico o a su profesional de la salud para chequear su evolucin peridicamente. Si los sntomas no comienzan a mejorar o si empeoran, consulte con su mdico o con su profesional de KB Home	[place]. Se le podr secar la boca. Masticar chicle sin azcar, chupar caramelos duros y tomar agua en abundancia le ayudar a mantener la boca hmeda. Si el problema no desaparece o es severo, consulte a su mdico. Este medicamento puede resecarle los ojos y provocar visin borrosa. Si [place] lentes de contacto, puede sentir ciertas molestias. Las gotas lubricantes pueden ser tiles. Si el problema no desaparece o es severo, consulte a su mdico de los ojos. Puede experimentar mareos o somnolencia. No conduzca ni utilice maquinaria ni haga nada que Associate Professor en estado de alerta hasta que sepa cmo le afecta este medicamento. No se siente ni se ponga de pie con rapidez, especialmente si es un paciente de edad avanzada. Esto reduce el riesgo de mareos o [employer]. El alcohol puede interferir con el efecto de este medicamento. Evite consumir  bebidas alcohlicas. Qu efectos secundarios puedo tener al [employer] este medicamento? Efectos secundarios que debe informar a su mdico o a Barrister's clerk de la salud tan pronto como sea posible: -Chief of Staff como erupcin cutnea, picazn o urticarias, hinchazn de la cara, labios o lengua -problemas respiratorios -cambios en la visin -dolor en el pecho -pulso cardiaco rpido, irregular -miedo, ansiedad, inquietud, temblores -fiebre, dolor de garganta -alucinaciones -alta presin sangunea -convulsiones -dificultad para orinar -sangrado, magulladuras inusuales -cansancio o debilidad inusual Efectos secundarios que, por lo general, no requieren atencin mdica (debe informarlos a su mdico  o a su profesional de la salud si persisten o si son molestos): -torpeza -boca, nariz, garganta seca -piel enrojecida, roja -dolor de cabeza -prdida del apetito -nuseas, malestar estomacal Puede ser que esta lista no menciona todos los posibles efectos secundarios. Comunquese a su mdico por asesoramiento mdico [employer]. Usted puede informar los efectos secundarios a la FDA por telfono al 1-800-FDA-1088. Dnde debo guardar mi medicina? Mantngala fuera del alcance de los nios. Gurdela a [employer], entre 15 y 58 grados C (21 y 26 grados F). Deseche todo el medicamento que no haya utilizado, despus de la fecha de vencimiento. ATENCIN: Este folleto es un resumen. Puede ser que no cubra toda la posible informacin. Si usted tiene preguntas acerca de esta medicina, consulte con su mdico, su farmacutico o su profesional de Technical sales engineer.    2016, Elsevier/Gold Standard. (2014-07-31 00:00:00)

## 2015-10-22 LAB — URINALYSIS W MICROSCOPIC + REFLEX CULTURE
BACTERIA UA: NONE SEEN [HPF]
Bilirubin Urine: NEGATIVE
CASTS: NONE SEEN [LPF]
Crystals: NONE SEEN [HPF]
GLUCOSE, UA: NEGATIVE
HGB URINE DIPSTICK: NEGATIVE
Ketones, ur: NEGATIVE
Leukocytes, UA: NEGATIVE
Nitrite: NEGATIVE
Protein, ur: NEGATIVE
RBC / HPF: NONE SEEN RBC/HPF (ref <=2)
Specific Gravity, Urine: 1.041 — ABNORMAL HIGH (ref 1.001–1.035)
WBC, UA: NONE SEEN WBC/HPF (ref <=5)
YEAST: NONE SEEN [HPF]
pH: 5.5 (ref 5.0–8.0)

## 2015-10-23 ENCOUNTER — Encounter: Payer: Self-pay | Admitting: Gynecology

## 2015-10-23 ENCOUNTER — Ambulatory Visit (INDEPENDENT_AMBULATORY_CARE_PROVIDER_SITE_OTHER): Payer: BLUE CROSS/BLUE SHIELD | Admitting: Gynecology

## 2015-10-23 ENCOUNTER — Ambulatory Visit (INDEPENDENT_AMBULATORY_CARE_PROVIDER_SITE_OTHER): Payer: BLUE CROSS/BLUE SHIELD

## 2015-10-23 ENCOUNTER — Other Ambulatory Visit: Payer: Self-pay | Admitting: Gynecology

## 2015-10-23 VITALS — BP 124/82 | Ht 65.0 in | Wt 201.0 lb

## 2015-10-23 DIAGNOSIS — R102 Pelvic and perineal pain unspecified side: Secondary | ICD-10-CM

## 2015-10-23 DIAGNOSIS — N9089 Other specified noninflammatory disorders of vulva and perineum: Secondary | ICD-10-CM

## 2015-10-23 DIAGNOSIS — N898 Other specified noninflammatory disorders of vagina: Secondary | ICD-10-CM

## 2015-10-23 DIAGNOSIS — N939 Abnormal uterine and vaginal bleeding, unspecified: Secondary | ICD-10-CM

## 2015-10-23 DIAGNOSIS — N83201 Unspecified ovarian cyst, right side: Secondary | ICD-10-CM | POA: Diagnosis not present

## 2015-10-23 DIAGNOSIS — Q51810 Arcuate uterus: Secondary | ICD-10-CM

## 2015-10-23 DIAGNOSIS — Z113 Encounter for screening for infections with a predominantly sexual mode of transmission: Secondary | ICD-10-CM

## 2015-10-23 DIAGNOSIS — Z3042 Encounter for surveillance of injectable contraceptive: Secondary | ICD-10-CM

## 2015-10-23 DIAGNOSIS — N83202 Unspecified ovarian cyst, left side: Secondary | ICD-10-CM

## 2015-10-23 LAB — PREGNANCY, URINE: PREG TEST UR: NEGATIVE

## 2015-10-23 MED ORDER — VALACYCLOVIR HCL 1 G PO TABS
1000.0000 mg | ORAL_TABLET | Freq: Two times a day (BID) | ORAL | Status: DC
Start: 2015-10-23 — End: 2016-10-21

## 2015-10-23 MED ORDER — MEDROXYPROGESTERONE ACETATE 150 MG/ML IM SUSP
150.0000 mg | Freq: Once | INTRAMUSCULAR | Status: AC
  Administered 2015-10-23: 150 mg via INTRAMUSCULAR

## 2015-10-23 NOTE — Progress Notes (Addendum)
   Patient is a 45 year old who was seen in the office 2 days ago complaining of some slight vaginal discharge and a wet prep demonstrated some yeast and many bacteria so she was started on Flagyl 500 mg twice a day for 5 days and Diflucan 150 mg one tablet. Patient also stated for the past few week she's been complaining right lower quadrant discomfort sometimes radiating to her back on that last office visit her urinalysis was negative. She is on the oral contraceptive pill and states she's had good compliance occasional very light spotting. She denies any nausea, vomiting, fever or chills. She also brought to my attention that was an parous to bring up to discussion in the past that both her and her husband periodically have these outbreaks of these lesions for her insulin genital as well as and her husband and perioral 2 or 3 times a year. Patient states that her and her husband had been monogamous for the past 15 years but they had prior relationships with her individuals. She thought that she felt a lesion on her left labia majora last night and she applied vinegar to the area and felt irritated.  Exam: External genitalia excoriated area midportion of the left labia majora. Patient does report that in the past she would notice a vesicle form but prior to that tingling sensation and then after the vesicle a scab forming that would resolve this would occur 2-3 times a year. No other external genital lesions were noted. Vaginal exam was done 2 days ago.  Urine pregnancy test today was negative  Ultrasound today: Uterus measured 8.2 x 5.2 x 3.5 cm. Endometrial stripe 3.1 mm on the right and 3.9 mm on the left. Negative cul-de-sac fluid. Right ovary thinwall echo-free cyst measuring 26 x 31 x 21 mm negative color flow, also a small echo-free follicle 25 x 20 x 12 mm. Left ovarian cyst 28 x 22 x 27 mm with a thin septum of 0.9 mm. Negative color flow Doppler.   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 A herpes culture was obtained today. She will be prescribed Valtrex 1 g by mouth twice a day for the next 7 days and await culture for confirmation. She'll be informed to abstain from intercourse of either her husband have an outbreak until complete resolution. Patient with bilateral benign-appearing cyst. She has 2 days left on the oral contraceptive pill have asked her to discontinue it. She will receive a shot of Depo-Provera 150 mg IM and will return back to the office in 3 months for a pelvic ultrasound to make sure there has been resolution of the ovarian cyst. She was then after the 3 months restart her oral contraceptive pill.

## 2015-10-23 NOTE — Patient Instructions (Addendum)
Herpes genital (Genital Herpes) El herpes genital es una enfermedad de transmisin sexual (ETS) comn causada por un virus. El virus se propaga de una persona a otra a travs del contacto sexual. La infeccin puede causar picazn, ampollas y llagas en la zona genital o rectal. Esto se denomina erupcin. Se presenta en hombres y mujeres. El herpes genital es particularmente preocupante para las mujeres embarazadas porque el virus puede transmitirse al beb durante el parto y causar problemas graves. El herpes genital tambin es una preocupacin para las personas con un sistema de defensa (inmunitario) debilitado. Los sntomas del herpes genital pueden durar varios das y luego desaparecer. Sin embargo, el virus permanece en el cuerpo, de modo que es posible que tenga ms erupciones en el futuro. El tiempo entre las erupciones vara: pueden transcurrir meses o aos. CAUSAS El herpes genital es causado por el virus del herpes simple (VHS) tipo2 o el VHS tipo1. Estos virus son contagiosos y, por lo general, se transmiten a travs del contacto sexual con una persona infectada. El contacto sexual incluye sexo vaginal, anal y oral. FACTORES DE RIESGO Los factores de riesgo de herpes genital incluyen:  Ser sexualmente activo con mltiples parejas.  Mantener relaciones sexuales sin proteccin. SIGNOS Y SNTOMAS Entre los sntomas se pueden incluir los siguientes:  Dolor y picazn en la zona genital o rectal.  Pequeos bultos rojos que se convierten en ampollas y luego en llagas.  Sntomas similares a los de la gripe, como:  Fiebre.  Dolores en el cuerpo.  Dolor al orinar.  Flujo vaginal. DIAGNSTICO El herpes genital se puede diagnosticar mediante:  Examen fsico.  Anlisis de sangre.  Prueba de cultivo del lquido de una llaga abierta. TRATAMIENTO No hay cura para el herpes genital. Se pueden usar medicamentos antivirales orales para acelerar la curacin y ayudar a evitar que los  sntomas vuelvan a aparecer. Estos medicamentos tambin ayudan a reducir el contagio del virus a las parejas sexuales. INSTRUCCIONES PARA EL CUIDADO EN EL HOGAR  Mantenga las zonas afectadas secas y limpias.  Tome los medicamentos solamente como se lo haya indicado el mdico.  No tenga contacto sexual durante infecciones activas. El herpes genital es contagioso.  Practique el sexo seguro. Los preservativos de ltex y los preservativos femeninos pueden ayudar a evitar el contagio del virus del herpes.  Evite frotar o tocar las ampollas y las llagas. Si lo hace:  Lvese bien las manos.  No se toque los ojos despus de ello.  Si queda embarazada, informe a su mdico si ha tenido herpes genital.  Concurra a todas las visitas de control como se lo haya indicado el mdico. Esto es importante. PREVENCIN  Use preservativos. Aunque una persona puede contraer herpes genital durante el contacto sexual incluso con el uso de un preservativo, el preservativo puede brindar cierta proteccin.  Evite tener mltiples parejas sexuales.  Hable con su pareja sexual acerca de los sntomas y los antecedentes que cualquiera de los dos pudiera tener.  Hgase pruebas antes de tener sexo. Pdale a su pareja que haga lo mismo.  Reconozca los sntomas de herpes genital. No tenga contacto sexual si nota estos sntomas. SOLICITE ATENCIN MDICA SI:  Los sntomas no mejoran con los medicamentos.  Los sntomas vuelven a aparecer.  Aparecen nuevos sntomas.  Tiene fiebre.  Siente dolor abdominal.  Presenta enrojecimiento, hinchazn o dolor en el ojo. ASEGRESE DE QUE:  Comprende estas instrucciones.  Controlar su afeccin.  Recibir ayuda de inmediato si no mejora o si   empeora.   Esta informacin no tiene Marine scientist el consejo del mdico. Asegrese de hacerle al mdico cualquier pregunta que tenga.   Document Released: 03/18/2005 Document Revised: 06/29/2014 Elsevier Interactive  Patient Education 2016 [employer].  Valacyclovir caplets Sander Nephew es este medicamento? El VALACICLOVIR es un medicamento antiviral. Se utiliza para tratar o prevenir infecciones provocadas por ciertos tipos de virus. Ejemplos de estas infecciones incluyen infecciones por herpes y [place]. Este medicamento no curar las infecciones de herpes. Este medicamento puede ser utilizado para otros usos; si tiene alguna pregunta consulte con su proveedor de atencin mdica o con su farmacutico. Qu le debo informar a mi profesional de la salud antes de tomar este medicamento? Necesita saber si usted presenta alguno de los siguientes problemas o situaciones: -sndrome de inmunodeficiencia adquirida (SIDA) -cualquier otra enfermedad que puede debilitar el sistema inmunolgico -transplante de mdula sea o rin -enfermedad renal -una reaccin alrgica o inusual al valaciclovir, aciclovir, ganciclovir, valganciclovir, a otros medicamentos, alimentos, colorantes o conservantes -si est embarazada o buscando quedar embarazada -si est amamantando a un beb Cmo debo utilizar este medicamento? Tome este medicamento por va oral con un vaso de agua. Siga las instrucciones de la etiqueta del [place]. Este medicamento se puede tomar con o sin alimentos. Tome sus dosis a intervalos regulares. No tome su medicamento con una frecuencia mayor a la indicada. Complete todo el tratamiento con el medicamento segn lo haya recetado su mdico o su profesional de la salud aun si considera que su problema ha mejorado. No deje de tomarlo excepto si as lo indica su mdico o su profesional de [employer]	[place]. Hable con su pediatra para informarse acerca del uso de este medicamento en nios. Aunque este medicamento ha sido recetado a nios tan menores como de 2 aos de edad para condiciones selectivas, las precauciones se aplican. Sobredosis: Pngase en contacto inmediatamente con un centro toxicolgico o una sala de urgencia si  usted cree que haya tomado demasiado medicamento. ATENCIN: [employer] es solo para usted. No comparta este medicamento con nadie. Qu sucede si me olvido de una dosis? Si olvida una dosis, tmela lo antes posible. Si es casi la hora de la prxima dosis, tome slo esa dosis. No tome dosis adicionales o dobles. Qu puede interactuar con este medicamento? -cimetidina -probenecid Puede ser que esta lista no menciona todas las posibles interacciones. Informe a su profesional de [employer]	[place] de [employer] productos a base de hierbas, medicamentos de [place] o suplementos nutritivos que est tomando. Si usted fuma, consume bebidas alcohlicas o si utiliza drogas ilegales, indqueselo tambin a su profesional de [employer]	[place]. Algunas sustancias pueden interactuar con su medicamento. A qu debo estar atento al usar [employer]? Si los sntomas no mejoran despus de 1 semana, informe a su mdico o a su profesional de [employer]	[place]. Este medicamento es ms eficaz cuando se lo empieza a tomar en cuanto comienza la infeccin, durante las primeras 72 horas. Comience el tratamiento tan pronto como le sea posible despus de observar los primeros signos de infeccin como, hormigueo, picazn o dolor en la zona afectada. Es posible transmitir el herpes genital aun cuando no tiene sntomas. Siempre sigue prcticas de sexo seguro como usar preservativos de ltex o poliuretano cuando tiene contacto sexual. Debe mantenerse bien hidrato mientras est tomando este medicamento. Beba lquido en abundancia. Qu efectos secundarios puedo tener al [employer] este medicamento? Efectos secundarios que debe informar a su mdico o a Barrister's clerk de la salud tan pronto  como sea posible: -reacciones alrgicas como erupcin cutnea, picazn o urticarias, hinchazn de la cara, labios o lengua -comportamiento agresivo -confusin -alucinaciones -problemas de coordinacin, del habla, al caminar -dolor  estomacal -temblor -dificultad para orinar o cambios en el volumen de orina Efectos secundarios que, por lo general, no requieren atencin mdica (debe informarlos a su mdico o a su profesional de la salud si persisten o si son molestos): -mareos -dolor de cabeza -nuseas, vmito Puede ser que esta lista no menciona todos los posibles efectos secundarios. Comunquese a su mdico por asesoramiento mdico [employer]. Usted puede informar los efectos secundarios a la FDA por telfono al 1-800-FDA-1088. Dnde debo guardar mi medicina? Mantngala fuera del alcance de los nios. Gurdela a [employer], entre 15 y 44 grados C (70 y 2 grados F). Mantenga el envase bien cerrado. Deseche todo el medicamento que no haya utilizado, despus de la fecha de vencimiento. ATENCIN: Este folleto es un resumen. Puede ser que no cubra toda la posible informacin. Si usted tiene preguntas acerca de esta medicina, consulte con su mdico, su farmacutico o su profesional de Technical sales engineer.    2016, Elsevier/Gold Standard. (2014-07-31 00:00:00)  Quiste ovrico (Ovarian Cyst) Un quiste ovrico es una bolsa llena de lquido que se forma en el ovario. Los ovarios son los rganos pequeos que producen vulos en las mujeres. Se pueden formar varios tipos de [employer]. Benito Mccreedy no son cancerosos. Muchos de ellos no causan problemas y con frecuencia desaparecen solos. Algunos pueden provocar sntomas y requerir Clinical research associate. Los tipos ms comunes de quistes ovricos son los siguientes:  Quistes funcionales: estos quistes pueden aparecer todos los meses durante el ciclo menstrual. Esto es normal. Estos quistes suelen desaparecer con el prximo ciclo menstrual si la mujer no queda embarazada. En general, los quistes funcionales no tienen sntomas.  Endometriomas: estos quistes se forman a partir del tejido que recubre el tero. Tambin se denominan "quistes de chocolate" porque se llenan  de sangre que se vuelve marrn. Este tipo de quiste puede Engineer, production en la zona inferior del abdomen durante la relacin sexual y con el perodo menstrual.  Cistoadenomas: este tipo se desarrolla a partir de las clulas que se [place] en el exterior del ovario. Estos quistes pueden ser muy grandes y causar dolor en la zona inferior del abdomen y durante la relacin sexual. Cindra Presume tipo de quiste puede girar sobre s mismo, cortar el suministro de Biochemist, clinical y causar un dolor intenso. Tambin se puede romper con facilidad y Stage manager.  Quistes dermoides: este tipo de quiste a veces se encuentra en ambos ovarios. Estos quistes pueden [employer] tipos de tejidos del organismo, como piel, dientes, pelo o Database administrator. Generalmente no tienen sntomas, a menos que sean muy grandes.  Quistes tecalutenicos: aparecen cuando se produce demasiada cantidad de cierta hormona (gonadotropina corinica humana) que estimula en exceso al ovario para que produzca vulos. Esto es ms frecuente despus de procedimientos que ayudan a la concepcin de un beb (fertilizacin in vitro). CAUSAS   Los medicamentos para la fertilidad pueden provocar una afeccin mediante la cual se forman mltiples quistes de gran tamao en los ovarios. Esta se denomina sndrome de hiperestimulacin ovrica.  El sndrome del ovario poliqustico es una afeccin que puede causar desequilibrios hormonales, los cuales pueden dar como resultado quistes ovricos no funcionales. SIGNOS Y SNTOMAS  Muchos quistes ovricos no causan sntomas. Si se presentan sntomas, stos pueden ser:  Dolor o molestias en la pelvis.  Dolor en la parte baja del abdomen.  [place].  Aumento del permetro abdominal (hinchazn).  Perodos menstruales anormales.  Aumento del [employer] perodos [place].  Cese de los perodos menstruales sin estar embarazada. DIAGNSTICO  Estos quistes se descubren comnmente  durante un examen de rutina o una exploracin ginecolgica anual. Es posible que se ordenen otros estudios para obtener ms informacin sobre el Derma. Estos estudios pueden ser:  Engineer, materials.  Radiografas de la pelvis.  Tomografa computada.  Resonancia magntica.  Anlisis de [place]. TRATAMIENTO  Muchos de los quistes ovricos desaparecen por s solos, sin tratamiento. Es probable que el mdico quiera controlar el quiste regularmente durante 2 o 79meses para ver si se produce algn cambio. En el caso de las mujeres en la menopausia, es particularmente importante controlar de cerca al quiste ya que el ndice de cncer de ovario en las mujeres menopusicas es ms alto. Cuando se requiere Clinical research associate, este puede incluir cualquiera de los siguientes:  Un procedimiento para drenar el quiste (aspiracin). Esto se puede realizar [employer] uso de [place] grande y [place]. Tambin se puede hacer a travs de un procedimiento laparoscpico, En este procedimiento, se inserta un tubo delgado que emite luz y que tiene una pequea cmara en un extremo (laparoscopio) a travs de una pequea incisin.  Ciruga para extirpar el quiste completo. Esto se puede realizar mediante una ciruga laparoscpica o Ardelia Mems ciruga abierta, la cual implica realizar una incisin ms grande en la parte inferior del abdomen.  Tratamiento hormonal o pldoras anticonceptivas. Estos mtodos a veces se usan para ayudar a Writer. [place] solo medicamentos de venta libre o recetados, segn las indicaciones del mdico.  Consulting civil engineer a las consultas de control con su mdico segn las indicaciones.  Hgase exmenes plvicos regulares y pruebas de [place]apan[place]ola[place]. SOLICITE ATENCIN MDICA SI:   Los perodos se atrasan, son irregulares, dolorosos o cesan.  El dolor plvico o abdominal no desaparece.  El abdomen se agranda o se hincha.  Siente presin en la vejiga o no  puede vaciarla completamente.  Siente dolor durante las [employer].  Tiene una sensacin de hinchazn, presin o Manufacturing systems engineer.  Pierde peso sin razn aparente.  Siente un Pharmacist, hospital.  Est estreida.  Pierde el apetito.  Le aparece acn.  Nota un aumento del vello corporal y facial.  Elenore Rota de peso sin hacer modificaciones en su actividad fsica y en su dieta habitual.  Sospecha que est embarazada. SOLICITE ATENCIN MDICA DE INMEDIATO SI:   Siente cada vez ms dolor abdominal.  Tiene malestar estomacal (nuseas) y vomita.  Tiene fiebre que se presenta de [place] repentina.  Siente dolor abdominal al defecar.  Sus perodos menstruales son ms abundantes que lo habitual. ASEGRESE DE QUE:   Comprende estas instrucciones.  Controlar su afeccin.  Recibir ayuda de inmediato si no mejora o si empeora.   Esta informacin no tiene Marine scientist el consejo del mdico. Asegrese de hacerle al mdico cualquier pregunta que tenga.   Document Released: 03/18/2005 Document Revised: 06/13/2013 Elsevier Interactive Patient Education [employer].

## 2015-10-23 NOTE — Addendum Note (Signed)
Addended by: Terrance Mass on: 10/23/2015 11:49 AM   Modules accepted: Orders

## 2015-10-23 NOTE — Addendum Note (Signed)
Addended by: Burnett Kanaris on: 10/23/2015 12:06 PM   Modules accepted: Orders

## 2015-10-25 LAB — SURESWAB HSV, TYPE 1/2 DNA, PCR
HSV 1 DNA: NOT DETECTED
HSV 2 DNA: NOT DETECTED

## 2015-10-25 NOTE — Addendum Note (Signed)
Addended by: Terrance Mass on: 10/25/2015 03:20 PM   Modules accepted: Level of Service

## 2016-01-15 ENCOUNTER — Other Ambulatory Visit: Payer: Self-pay | Admitting: Physician Assistant

## 2016-01-15 DIAGNOSIS — I10 Essential (primary) hypertension: Secondary | ICD-10-CM

## 2016-01-22 ENCOUNTER — Encounter: Payer: Self-pay | Admitting: Gynecology

## 2016-01-22 ENCOUNTER — Ambulatory Visit (INDEPENDENT_AMBULATORY_CARE_PROVIDER_SITE_OTHER): Payer: BLUE CROSS/BLUE SHIELD | Admitting: Gynecology

## 2016-01-22 ENCOUNTER — Ambulatory Visit (INDEPENDENT_AMBULATORY_CARE_PROVIDER_SITE_OTHER): Payer: BLUE CROSS/BLUE SHIELD

## 2016-01-22 VITALS — BP 126/84

## 2016-01-22 DIAGNOSIS — N83202 Unspecified ovarian cyst, left side: Secondary | ICD-10-CM | POA: Diagnosis not present

## 2016-01-22 DIAGNOSIS — N83201 Unspecified ovarian cyst, right side: Secondary | ICD-10-CM

## 2016-01-22 DIAGNOSIS — G44019 Episodic cluster headache, not intractable: Secondary | ICD-10-CM | POA: Diagnosis not present

## 2016-01-22 DIAGNOSIS — Z8742 Personal history of other diseases of the female genital tract: Secondary | ICD-10-CM | POA: Diagnosis not present

## 2016-01-22 MED ORDER — NORETHIN ACE-ETH ESTRAD-FE 1-20 MG-MCG PO TABS: 1.0000 | ORAL_TABLET | Freq: Every day | ORAL | 11 refills | Status: DC

## 2016-01-22 NOTE — Progress Notes (Signed)
   Patient is a 45 year old who presented to the office today for follow-up ultrasound as a result of bilateral ovarian cysts that was noted 3 months ago when she was having left lower quadrant discomfort. She had stopped her oral contraceptive pill and then she was administered Depo-Provera 150 mg IM and instructed to come to the office today for follow-up ultrasound. She's had some headaches that she is wearing off the effect of the Depo-Provera and had some spotting a few days ago.  Ultrasound today uterus measures 7.9 x 5.5 x 3.7 mm with endometrial stripe of 3.6 mm. Right ovary was normal with some follicles measuring less than 15 mm. Left ovary normal previous cyst of the right and left ovary not seen. No fluid in the cul-de-sac.  Assessment/plan: Complete resolution of bilateral ovarian cyst patient asymptomatic. Patient scheduled to return to the office next month for her annual exam.  Greater than 50% time was spent in counseling cord any care for this patient. Time of consultation 10 minutes

## 2016-02-17 ENCOUNTER — Other Ambulatory Visit: Payer: Self-pay | Admitting: Physician Assistant

## 2016-02-17 DIAGNOSIS — I10 Essential (primary) hypertension: Secondary | ICD-10-CM

## 2016-02-25 ENCOUNTER — Ambulatory Visit (INDEPENDENT_AMBULATORY_CARE_PROVIDER_SITE_OTHER): Payer: BLUE CROSS/BLUE SHIELD | Admitting: Family Medicine

## 2016-02-25 VITALS — BP 152/88 | HR 96 | Temp 98.8°F | Resp 16 | Ht 65.25 in | Wt 203.4 lb

## 2016-02-25 DIAGNOSIS — R600 Localized edema: Secondary | ICD-10-CM

## 2016-02-25 DIAGNOSIS — R5383 Other fatigue: Secondary | ICD-10-CM | POA: Diagnosis not present

## 2016-02-25 DIAGNOSIS — I10 Essential (primary) hypertension: Secondary | ICD-10-CM | POA: Diagnosis not present

## 2016-02-25 MED ORDER — AMLODIPINE BESYLATE 5 MG PO TABS: 5.0000 mg | ORAL_TABLET | Freq: Every day | ORAL | 3 refills | Status: DC

## 2016-02-25 MED ORDER — HYDROCHLOROTHIAZIDE 12.5 MG PO CAPS: 12.5000 mg | ORAL_CAPSULE | Freq: Every day | ORAL | 0 refills | Status: DC

## 2016-02-25 NOTE — Patient Instructions (Addendum)
Start Amlodipine 5 mg.  It is okay to finish 2.5 mg tablets of amlodipine  by taking  2 of your 2.5 tablets.  Wait to you hear from me regarding your labs, before starting the Microzide 12.5 mg tablet for lower extremity swelling.   IF you received an x-ray today, you will receive an invoice from [facility]. Please contact [facility] at [phone] with questions or concerns regarding your invoice.   IF you received labwork today, you will receive an invoice from [employer]. Please contact [facility] at [phone] with questions or concerns regarding your invoice.   Our billing staff will not be able to assist you with questions regarding bills from these companies.  You will be contacted with the lab results as soon as they are available. The fastest way to get your results is to activate your My Chart account. Instructions are located on the last page of this paperwork. If you have not heard from [place] regarding the results in 2 weeks, please contact this office.     Plan de alimentacin cardiosaludable (Heart-Healthy Eating Plan) La planificacin de las comidas cardiosaludables incluye lo siguiente:  Limitar las grasas poco saludables.  Aumentar las grasas saludables.  Hacer otros pequeos cambios en la dieta. Es posible que tenga que hablar con el mdico o con un especialista en alimentacin (nutricionista) para crear un plan de alimentacin que sea adecuado para usted. QU TIPOS DE GRASAS DEBO ELEGIR?  Arnold grasas saludables. Estas incluyen aceite de oliva y de canola, semillas de lino, nueces, almendras y semillas.  Consuma ms grasas omega-3. Estas incluyen salmn, caballa, sardinas, atn, aceite de lino y semillas de lino molidas. Trate de comer pescado al [employer] veces por semana.  Limite el consumo de grasas saturadas,  las cuales suelen [employer] productos de origen animal, como carnes, [place] y  crema.  Las grasas saturadas de origen vegetal incluyen aceite de palma, de palmiste y de coco.  Evite los alimentos con aceites parcialmente hidrogenados. Estos incluyen margarina en barra, algunas margarinas untables, galletas, galletitas y otros productos horneados. Estos contienen grasas trans. Hope?  Lea atentamente las etiquetas de los alimentos. Identifique los que contienen grasas trans o altas cantidades de grasas saturadas.  Llene la mitad del plato con verduras y ensaladas de hojas verdes. Coma 4 o 5porciones de verduras Market researcher. Una porcin de verduras equivale a lo siguiente:  1 taza de verduras de hoja crudas.   taza de verduras en trozos crudas o cocidas.   taza de jugo de verduras.  Llene un cuarto del plato con cereales integrales. Busque la palabra "integral" en Equities trader de la lista de ingredientes.  Llene un cuarto del plato con alimentos con protenas magras.  Coma 4 o 5porciones de frutas por da. Una porcin de fruta equivale a lo siguiente:  Una fruta mediana entera.  taza de fruta disecada.   taza de fruta fresca, congelada o enlatada.  taza de jugo 100% de fruta.  Consuma ms alimentos con fibra soluble, los cuales incluyen [place], brcoli, zanahorias, frijoles, guisantes y [place]. Trate de consumir de 20a 30g de [employer] Stores.  Coma ms comidas caseras. Coma menos comida de restaurantes, bufs y comida rpida.  Limite o evite el alcohol.  Limite los alimentos con alto contenido de almidn y Location manager.  Evite las comidas fritas.  Evite frer los alimentos. En cambio, trate de cocinarlos en el horno, en la plancha o  en la parrilla, o hervirlos. Tambin puede reducir las grasas de la siguiente forma:  Quite la piel de las aves.  Quite todas las grasas visibles de las carnes.  Espume la grasa de los guisos, las sopas y las salsas antes de servirlos.  Cocine al vapor las verduras en agua o  caldo.  Baje de peso si es necesario.  Coma 4 o 5porciones de frutos secos, legumbres y semillas por semana:  Una porcin de frijoles o legumbres secos equivale a taza despus de su coccin.  Una porcin de frutos secos equivale a 1onzas.  Una porcin de semillas equivale a onza o una cucharada.  Tal vez deba llevar un registro de la cantidad de sal o sodio que ingiere, especialmente si tiene la presin hipertensin arterial. Hable con el mdico o el nutricionista para obtener ms informacin. QU ALIMENTOS PUEDO COMER? Cereales Panes, incluido el pan francs, blanco, pita, de [place], de pasas de [place], de centeno, de avena e [place]. Tortillas que no estn fritas ni elaboradas con manteca de cerdo ni grasas trans. Panecillos bajos en grasas, incluidos los panes para perros calientes y [place], y los bollitos tipo ingls. Galletas. Muffins. Waffles. Panqueques. Palomitas de maz con bajo contenido calrico. Cereales integrales. Pan sin levadura. Tostada Melba. Pretzels. Palitos de pan. Galletas. Colaciones bajas en grasas. Galletitas Parker [employer]in, Lockheed [employer], las que tienen forma de [place], las [place], el pan cimo, las [place], las que tienen forma de [place] y las de centeno. Arroz y pastas, incluido el arroz integral y las pastas elaboradas con cereales integrales.  [place] verduras.  Frutas Todas las frutas, pero limite el [place]. [place] y [place] Housing fuentes de protenas Carne de res, [place], cerdo y cordero magras sin grasa. Pollo y pavo sin piel. Todos los pescados y [employer] H[employer]away. Pato salvaje, conejo, faisn y venado. Claras de huevo o sustitutos del huevo bajos en colesterol. Porotos, guisantes, lentejas secos y tofu. Semillas y la mayora de los frutos secos. Lcteos Quesos descremados y semidescremados, entre ellos, ricota, queso en hebras y Artist. Leche descremada o al 1% que sea lquida, en polvo o evaporada. [place]. Yogur descremado o bajo en grasas. Bebidas Agua mineral. Bebidas gaseosas dietticas. Dulces y postres Sorbetes y helados de fruta. [place], [place], [place], jalea y [place]. Merengues y gelatinas. Caramelos de [employer], como caramelos duros, caramelos de goma, pastillas de goma, mentas, malvaviscos y pequeas cantidades de chocolate amargo. Torta ngel. Coma todos los dulces y postres con moderacin. Grasas y Naval architect no hidrogenadas (sin grasas trans). Aceites vegetales, incluido el de soja, ssamo, girasol, [place], man, crtamo, maz, canola y semillas de algodn. Alios para ensalada o mayonesa elaborados con aceite vegetal. [place] grasas y los aceites agregados que [place] para Audiological scientist, Teacher, music, preparar ensaladas y las cremas untables. Otros [place] en polvo. T o caf. Todos los alios y condimentos. Los artculos mencionados arriba pueden no ser [employer] Foods Company de las bebidas o los alimentos recomendados. Comunquese con el nutricionista para conocer ms opciones. QU ALIMENTOS NO SE RECOMIENDAN? Cereales Panes elaborados con grasas saturadas o trans, aceites o Mattel. Croissants. Panecillos de mantequilla. Panes de queso. Panecillos dulces. Rosquillas. Palomitas de maz con mantequilla. Fideos chow mein. Galletitas con [employer]els de grasas, como las que contienen queso o [place]. Carnes y otras fuentes de protenas Carnes grasas, como perros calientes, Daisy de res, salchichas, puntas de [place], [place], asado de [place] o [place], y carnero. [employer]  en grasas, como salame y mortadela. Caviar. Pato y ganso domsticos. Vsceras, como riones, hgado, [place], y Training and development officer. Lcteos Crema, crema agria, queso crema y [place] cottage con crema. Quesos elaborados con Mattel, incluido el queso [place] (bleu), [place], [place], [place], [place], [place], suizo, cheddar, camembert y [place]. Leche entera o al 2% que sea [place],  evaporada o condensada. Suero de U.S. Bancorp. Salsa de crema o queso alta en grasas. Yogur elaborado con Mattel. Bebidas Refrescos regulares y jugos con agregado de Location manager. Dulces y Hilton Hotels. Pudin. Galletas. Tortas que no sean la torta ngel. Caramelos que contengan chocolate con leche o chocolate blanco, grasa hidrogenada, mantequilla, coco o ingredientes desconocidos. Almbares con mantequilla. Helados o bebidas elaboradas con helado con alto contenido de grasas. Grasas y aceites Salsas que [place], [place] de carne o materia grasa. Manteca de cacao, aceites hidrogenados, aceite de palma, aceite de coco, aceite de palmiste. A menudo, estos se encuentran en los productos horneados, los caramelos, las comidas fritas, las cremas no lcteas y las coberturas batidas. Grasas y materias grasas slidas, incluida la grasa del tocino, el cerdo [place], la [place] de cerdo y Community education officer. Sustitutos de crema no lctea, como cremas para caf y sustitutos de crema agria. Alios para ensaladas elaborados con aceites desconocidos, queso o crema agria. Los artculos mencionados arriba pueden no ser Dean Foods Company de las bebidas y los alimentos que se Higher education careers adviser. Comunquese con el nutricionista para obtener ms informacin.   Esta informacin no tiene Marine scientist el consejo del mdico. Asegrese de hacerle al mdico cualquier pregunta que tenga.   Document Released: 12/08/2011 Document Revised: 06/29/2014 Elsevier Interactive Patient Education [employer].

## 2016-02-25 NOTE — Progress Notes (Signed)
Patient ID: Kristie Hill, female    DOB: 31-Jan-1971, 45 y.o.   MRN: [id number]  PCP: No PCP Per Patient  Chief Complaint  Patient presents with  . Hypertension    left ankle swelling  . Headache    Subjective:   HPI Presents for evaluation of hypertension.  45 year old female presents today with concerns regarding elevated blood pressure. She occasionally checks her blood pressure at home and reports systolic readings ranging  [id number] and diastolic readings [id number].  Reports swelling of her feet and having a headache, which is localized to occiptal region of her head, which she feels is related to elevated blood pressure.  She denies chest pain or shortness of breath. She reports an increase in fatigue.  She currently takes 2.5 amlodipine reports taking daily.  He reports taking a combination of Advil or Tylenol to relieve her headaches if they get to the point where they're significantly painful.  He reports relief of her headache with either of these medications.  . Social History   Social History  . Marital status: Married    Spouse name: N/A  . Number of children: N/A  . Years of education: N/A   Occupational History  . Not on file.   Social History Main Topics  . Smoking status: Never Smoker  . Smokeless tobacco: Never Used  . Alcohol use Yes     Comment: occ  . Drug use: No  . Sexual activity: Yes   Other Topics Concern  . Not on file   Social History Narrative  . No narrative on file    . Family History  Problem Relation Age of Onset  . Heart disease Father   . Hypertension Father   . Cancer Paternal Grandmother     BRAIN  . Cancer Paternal Grandfather     LUNG  . Hypertension Mother      Review of Systems  Constitutional: Positive for fatigue.  Respiratory: Negative for shortness of breath.   Cardiovascular: Negative for chest pain and palpitations.       Foot and ankle swelling.  Neurological: Positive for headaches.  See history of  present illness.    Patient Active Problem List   Diagnosis Date Noted  . 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 Authorizing Provider  amLODipine (NORVASC) 2.5 MG tablet TAKE 1 TABLET BY MOUTH DAILY 01/16/16  Yes Chelle Jeffery, PA-C  fluconazole (DIFLUCAN) 150 MG tablet Take 1 tablet (150 mg total) by mouth once. Patient not taking: Reported on 01/22/2016 10/21/15   Terrance Mass, MD  metroNIDAZOLE (FLAGYL) 500 MG tablet Take 1 tablet (500 mg total) by mouth 2 (two) times daily. Patient not taking: Reported on 01/22/2016 10/21/15   Terrance Mass, MD  norethindrone-ethinyl estradiol (JUNEL FE,GILDESS FE,LOESTRIN FE) 1-20 MG-MCG tablet Take 1 tablet by mouth daily. Patient not taking: Reported on 02/25/2016 01/22/16   Terrance Mass, MD  valACYclovir (VALTREX) 1000 MG tablet Take 1 tablet (1,000 mg total) by mouth 2 (two) times daily. Patient not taking: Reported on 02/25/2016 10/23/15   Terrance Mass, MD     No Known Allergies     Objective:  Physical Exam  Constitutional: She is oriented to person, place, and time. She appears well-developed and well-nourished.  HENT:  Head: Normocephalic and atraumatic.  Right Ear: External ear normal.  Left Ear:  External ear normal.  Eyes: Pupils are equal, round, and reactive to light.  Neck: Normal range of motion. Neck supple. No thyromegaly present.  Cardiovascular: Normal rate, regular rhythm, normal heart sounds and intact distal pulses.   Pulmonary/Chest: Effort normal and breath sounds normal.  Musculoskeletal: Normal range of motion. She exhibits edema.  +2 nonpitting edema bilateral ankles  Neurological: She is alert and oriented to person, place, and time. She has normal reflexes.  Skin: Skin is warm and dry.  Psychiatric: She has a normal mood and affect. Her behavior is  normal. Judgment and thought content normal.    Vitals:   02/25/16 1758  BP: (!) 152/88  Pulse: 96  Resp: 16  Temp: 98.8 F (37.1 C)    Assessment & Plan:  1. Essential hypertension, uncontrolled with 2.5 mg of amlodipine. Plan: - COMPLETE METABOLIC PANEL WITH GFR-unremarlable - Lipid panel -controlled  -Increase amlodipine 5 mg daily -Add hydrochlorothiazide 12.5 mg daily.  2. Other fatigue, possibly related to uncontrolled blood pressure and headache. Plan: - TSH-remarkable.  Repeat in one year. -Obtain at minimal of 8 hours night time sleep.  3. Bilateral edema of lower extremity, likely related to fluid retention and uncontrolled blood pressure. Plan: --Add hydrochlorothiazide 12.5 mg daily.  Carroll Sage. Kenton Kingfisher, MSN, FNP-C [facility]

## 2016-02-25 NOTE — Progress Notes (Deleted)
   Sanvitha Merino-Alcalde  MRN: [id number] DOB: 06-Aug-1970  PCP: No PCP Per Patient  Subjective:  Pt presents to clinic for ***  Review of Systems  Patient Active Problem List   Diagnosis Date Noted  . 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 amLODipine (NORVASC) 2.5 MG tablet TAKE 1 TABLET BY MOUTH DAILY 30 tablet 0  . fluconazole (DIFLUCAN) 150 MG tablet Take 1 tablet (150 mg total) by mouth once. (Patient not taking: Reported on 01/22/2016) 1 tablet 0  . metroNIDAZOLE (FLAGYL) 500 MG tablet Take 1 tablet (500 mg total) by mouth 2 (two) times daily. (Patient not taking: Reported on 01/22/2016) 10 tablet 0  . norethindrone-ethinyl estradiol (JUNEL FE,GILDESS FE,LOESTRIN FE) 1-20 MG-MCG tablet Take 1 tablet by mouth daily. (Patient not taking: Reported on 02/25/2016) 1 Package 11  . valACYclovir (VALTREX) 1000 MG tablet Take 1 tablet (1,000 mg total) by mouth 2 (two) times daily. (Patient not taking: Reported on 02/25/2016) 14 tablet 4   No current facility-administered medications on file prior to visit.     No Known Allergies  Objective:  BP (!) 152/88 (BP Location: Right Arm, Patient Position: Sitting, Cuff Size: Normal)   Pulse 96   Temp 98.8 F (37.1 C) (Oral)   Resp 16   Ht 5' 5.25" (1.657 m)   Wt 203 lb 6.4 oz (92.3 kg)   SpO2 98%   BMI 33.59 kg/m   Physical Exam  Assessment and Plan :  There are no diagnoses linked to this encounter. ***  Mercer Pod, PA-C  [facility] 02/25/2016 6:15 PM

## 2016-02-26 ENCOUNTER — Telehealth: Payer: Self-pay | Admitting: Family Medicine

## 2016-02-26 LAB — COMPLETE METABOLIC PANEL WITH GFR
ALT: 12 U/L (ref 6–29)
AST: 13 U/L (ref 10–30)
Albumin: 4.2 g/dL (ref 3.6–5.1)
Alkaline Phosphatase: 38 U/L (ref 33–115)
BUN: 14 mg/dL (ref 7–25)
CHLORIDE: 103 mmol/L (ref 98–110)
CO2: 26 mmol/L (ref 20–31)
Calcium: 9.6 mg/dL (ref 8.6–10.2)
Creat: 0.83 mg/dL (ref 0.50–1.10)
GFR, EST NON AFRICAN AMERICAN: 86 mL/min (ref >=60)
GLUCOSE: 105 mg/dL — AB (ref 65–99)
POTASSIUM: 4.2 mmol/L (ref 3.5–5.3)
SODIUM: 138 mmol/L (ref 135–146)
TOTAL PROTEIN: 7.5 g/dL (ref 6.1–8.1)
Total Bilirubin: 0.2 mg/dL (ref 0.2–1.2)

## 2016-02-26 LAB — LIPID PANEL
CHOL/HDL RATIO: 2.8 ratio (ref <=5.0)
Cholesterol: 174 mg/dL (ref 125–200)
HDL: 62 mg/dL (ref >=46)
LDL CALC: 86 mg/dL (ref <130)
TRIGLYCERIDES: 131 mg/dL (ref <150)
VLDL: 26 mg/dL (ref <30)

## 2016-02-26 LAB — TSH: TSH: 1.93 m[IU]/L

## 2016-02-26 NOTE — Telephone Encounter (Signed)
Called and updated regarding labswere normal. Okay to start hydrochlorithiazide.  Kristie Hill. Kenton Kingfisher, MSN, FNP-C [facility]

## 2016-02-27 ENCOUNTER — Encounter: Payer: Self-pay | Admitting: Gynecology

## 2016-02-27 ENCOUNTER — Ambulatory Visit (INDEPENDENT_AMBULATORY_CARE_PROVIDER_SITE_OTHER): Payer: BLUE CROSS/BLUE SHIELD | Admitting: Gynecology

## 2016-02-27 VITALS — BP 134/88 | Ht 64.0 in | Wt 202.0 lb

## 2016-02-27 DIAGNOSIS — Z01419 Encounter for gynecological examination (general) (routine) without abnormal findings: Secondary | ICD-10-CM

## 2016-02-27 DIAGNOSIS — R635 Abnormal weight gain: Secondary | ICD-10-CM | POA: Diagnosis not present

## 2016-02-27 DIAGNOSIS — I10 Essential (primary) hypertension: Secondary | ICD-10-CM

## 2016-02-27 DIAGNOSIS — Z8639 Personal history of other endocrine, nutritional and metabolic disease: Secondary | ICD-10-CM

## 2016-02-27 DIAGNOSIS — G44019 Episodic cluster headache, not intractable: Secondary | ICD-10-CM | POA: Diagnosis not present

## 2016-02-27 LAB — CBC WITH DIFFERENTIAL/PLATELET
BASOS ABS: 0 {cells}/uL (ref 0–200)
Basophils Relative: 0 %
EOS ABS: 71 {cells}/uL (ref 15–500)
Eosinophils Relative: 1 %
HEMATOCRIT: 39.3 % (ref 35.0–45.0)
Hemoglobin: 13 g/dL (ref 11.7–15.5)
LYMPHS PCT: 31 %
Lymphs Abs: 2201 cells/uL (ref 850–3900)
MCH: 30.7 pg (ref 27.0–33.0)
MCHC: 33.1 g/dL (ref 32.0–36.0)
MCV: 92.9 fL (ref 80.0–100.0)
MONO ABS: 497 {cells}/uL (ref 200–950)
MONOS PCT: 7 %
MPV: 10.4 fL (ref 7.5–12.5)
Neutro Abs: 4331 cells/uL (ref 1500–7800)
Neutrophils Relative %: 61 %
PLATELETS: 300 10*3/uL (ref 140–400)
RBC: 4.23 MIL/uL (ref 3.80–5.10)
RDW: 13.4 % (ref 11.0–15.0)
WBC: 7.1 10*3/uL (ref 3.8–10.8)

## 2016-02-27 LAB — COMPREHENSIVE METABOLIC PANEL
ALK PHOS: 47 U/L (ref 33–115)
ALT: 13 U/L (ref 6–29)
AST: 15 U/L (ref 10–30)
Albumin: 4.3 g/dL (ref 3.6–5.1)
BUN: 13 mg/dL (ref 7–25)
CALCIUM: 9.4 mg/dL (ref 8.6–10.2)
CHLORIDE: 103 mmol/L (ref 98–110)
CO2: 26 mmol/L (ref 20–31)
Creat: 0.79 mg/dL (ref 0.50–1.10)
GLUCOSE: 98 mg/dL (ref 65–99)
POTASSIUM: 4.3 mmol/L (ref 3.5–5.3)
Sodium: 136 mmol/L (ref 135–146)
TOTAL PROTEIN: 7.5 g/dL (ref 6.1–8.1)
Total Bilirubin: 0.3 mg/dL (ref 0.2–1.2)

## 2016-02-27 LAB — LIPID PANEL
CHOL/HDL RATIO: 2.7 ratio (ref <=5.0)
CHOLESTEROL: 181 mg/dL (ref 125–200)
HDL: 67 mg/dL (ref >=46)
LDL Cholesterol: 91 mg/dL (ref <130)
Triglycerides: 113 mg/dL (ref <150)
VLDL: 23 mg/dL (ref <30)

## 2016-02-27 LAB — TSH: TSH: 1.92 mIU/L

## 2016-02-27 MED ORDER — HYDROCHLOROTHIAZIDE 12.5 MG PO CAPS: 12.5000 mg | ORAL_CAPSULE | Freq: Every day | ORAL | 11 refills | Status: DC

## 2016-02-27 MED ORDER — NORETHIN-ETH ESTRAD-FE BIPHAS 1 MG-10 MCG / 10 MCG PO TABS: 1.0000 | ORAL_TABLET | Freq: Every day | ORAL | 11 refills | Status: DC

## 2016-02-27 NOTE — Patient Instructions (Addendum)
Hipertensin (Hypertension) La hipertensin, conocida comnmente como presin arterial alta, se produce cuando la sangre bombea en las arterias con mucha fuerza. Las arterias son los vasos sanguneos que transportan la sangre desde el corazn hacia todas las partes del cuerpo. Una lectura de la presin arterial consiste en un nmero ms alto sobre un nmero ms bajo, por ejemplo, 110/72. El nmero ms alto (presin sistlica) corresponde a la presin interna de las arterias cuando el corazn bombea sangre. El nmero ms bajo (presin diastlica) corresponde a la presin interna de las arterias cuando el corazn se relaja. En condiciones ideales, la presin arterial debe ser inferior a 120/80. La hipertensin fuerza al corazn a trabajar ms para bombear la sangre. Las arterias pueden estrecharse o ponerse rgidas. La hipertensin no tratada o no controlada puede causar infarto de miocardio, ictus, enfermedad renal y otros problemas. FACTORES DE RIESGO Algunos factores de riesgo de hipertensin son controlables, pero otros no lo son.  Entre los factores de riesgo que usted no puede controlar, se incluyen los siguientes:   La raza. El riesgo es mayor para las personas afroamericanas.  La edad. Los riesgos aumentan con la edad.  El sexo. Antes de los 45aos, los hombres corren ms riesgo que las mujeres. Despus de los 65aos, las mujeres corren ms riesgo que los hombres. Entre los factores de riesgo que usted puede controlar, se incluyen los siguientes:  No hacer la cantidad suficiente de actividad fsica o ejercicio.  Tener sobrepeso.  Consumir mucha grasa, azcar, caloras o sal en la dieta.  Beber alcohol en exceso. SIGNOS Y SNTOMAS Por lo general, la hipertensin no causa signos o sntomas. La hipertensin arterial demasiado alta (crisis hipertensiva) puede causar dolor de cabeza, ansiedad, falta de aire y hemorragia nasal. DIAGNSTICO Para detectar si usted tiene hipertensin, el  mdico le medir la presin arterial mientras est sentado, con el brazo levantado a la altura del corazn. Debe medirla al menos dos veces en el mismo brazo. Determinadas condiciones pueden causar una diferencia de presin arterial entre el brazo izquierdo y el derecho. El hecho de tener una sola lectura de la presin arterial ms alta que lo normal no significa que necesita un tratamiento. Si no est claro si tiene hipertensin arterial, es posible que se le pida que regrese otro da para volver a controlarle la presin arterial. O bien se le puede pedir que se controle la presin arterial en su casa durante 1 o ms meses. TRATAMIENTO El tratamiento de la hipertensin arterial incluye hacer cambios en el estilo de vida y, posiblemente, tomar medicamentos. Un estilo de vida saludable puede ayudar a bajar la presin arterial alta. Quiz deba cambiar algunos hbitos. Los cambios en el estilo de vida pueden incluir lo siguiente:  Seguir la dieta DASH. Esta dieta tiene un alto contenido de frutas, verduras y cereales integrales. Incluye poca cantidad de sal, carnes rojas y azcares agregados.  Mantenga el consumo de sodio por debajo de 2300 mg por da.  Realizar al menos entre 30 y 45 minutos de ejercicio aerbico, 4 veces por semana como mnimo.  Perder peso, si es necesario.  No fumar.  Limitar el consumo de bebidas alcohlicas.  Aprender formas de reducir el estrs. El mdico puede recetarle medicamentos si los cambios en el estilo de vida no son suficientes para lograr controlar la presin arterial y si una de las siguientes afirmaciones es verdadera:  Tiene entre 18 y 59 aos y su presin arterial sistlica est por encima de 140.  Tiene   60 aos o ms y su presin arterial sistlica est por encima de 150.  Su presin arterial diastlica est por encima de 90.  Tiene diabetes y su presin arterial sistlica est por encima de 140 o su presin arterial diastlica est por encima de  90.  Tiene una enfermedad renal y su presin arterial est por encima de 140/90.  Tiene una enfermedad cardaca y su presin arterial est por encima de 140/90. La presin arterial deseada puede variar en funcin de las enfermedades, la edad y otros factores personales. INSTRUCCIONES PARA EL CUIDADO EN EL HOGAR  Haga que le midan de nuevo la presin arterial segn las indicaciones del [place] los medicamentos solamente como se lo haya indicado el mdico. Siga cuidadosamente las indicaciones. Los medicamentos para la presin arterial deben tomarse segn las indicaciones. Los medicamentos pierden eficacia al omitir las dosis. El hecho de omitir las dosis tambin [place] el riesgo de otros problemas.  No fume.  Contrlese la presin arterial en su casa segn las indicaciones del mdico. SOLICITE ATENCIN MDICA SI:   Piensa que tiene una reaccin alrgica a los medicamentos.  Tiene mareos o dolores de cabeza con Scientist, research (physical sciences).  Tiene hinchazn en los tobillos.  Tiene problemas de visin. SOLICITE ATENCIN MDICA DE INMEDIATO SI:  Siente un dolor de cabeza intenso o confusin.  Siente debilidad inusual, adormecimiento o que Geneticist, molecular.  Siente dolor intenso en el pecho o en el abdomen.  Vomita repetidas veces.  Tiene dificultad para respirar. ASEGRESE DE QUE:   Comprende estas instrucciones.  Controlar su afeccin.  Recibir ayuda de inmediato si no mejora o si empeora.   Esta informacin no tiene Marine scientist el consejo del mdico. Asegrese de hacerle al mdico cualquier pregunta que tenga.   Document Released: 06/08/2005 Document Revised: 10/23/2014 Elsevier Interactive Patient Education 2016 [place] para perder peso (Exercise to [employer]) La actividad fsica y Ardelia Mems dieta saludable ayudan a perder peso. El mdico podr sugerirle ejercicios especficos. IDEAS Y CONSEJOS PARA HACER EJERCICIOS  Elija opciones econmicas que disfrute  hacer , como caminar, andar en bicicleta o los vdeos para ejercitarse.   Utilice las Clinical cytogeneticist del ascensor.   Camine durante la hora del almuerzo.   Estacione el auto lejos del lugar de [place] o [place].   Concurra a un gimnasio o tome clases de gimnasia.   Comience con 5  10 minutos de actividad fsica por da. Ejercite hasta 30 minutos, 4 a 6 das por semana.   Utilice zapatos que tengan un buen soporte y ropas cmodas.   Elongue antes y despus de Chief Technology Officer.   Ejercite hasta que aumente la respiracin y el corazn palpite rpido.   Beba agua extra cuando ejercite.   No haga ejercicio Contractor, sentirse mareado o que le falte mucho el aire.  La actividad fsica puede quemar alrededor de 150 caloras.  Correr 20 cuadras en 15 minutos.   Jugar vley durante 45 a 60 minutos.   Limpiar y encerar el auto durante 45 a 60 minutos.   Jugar ftbol americano de toque.   Caminar 25 cuadras en 35 minutos.   Empujar un cochecito 20 cuadras en 30 minutos.   Jugar baloncesto durante 30 minutos.   Rastrillar hojas secas durante 30 minutos.   Andar en bicicleta 80 cuadras en 30 minutos.   Caminar 30 cuadras en 30 minutos.   Bailar durante 30 minutos.   Quitar la nieve con una pala durante 15  minutos.   Nadar vigorosamente durante 20 minutos.   Subir escaleras durante 15 minutos.   Andar en bicicleta 60 cuadras durante 15 minutos.   Arreglar el jardn entre 30 y 28 minutos.   Saltar a la soga durante 15 minutos.   Limpiar vidrios o pisos durante 45 a 60 minutos.  Document Released: 09/12/2010 Document Revised: 02/18/2011 [facility] Patient Information 2012 [place].                                                  Control del colesterol  Los niveles de colesterol en el organismo estn determinados significativamente por su dieta. Los niveles de colesterol tambin se relacionan con la enfermedad cardaca. El material que sigue ayuda a Dentist relacin y a Physiological scientist qu puede hacer para mantener su corazn sano. No todo el colesterol es [place]. Las lipoprotenas de baja densidad (LDL) forman el colesterol "malo". El colesterol malo puede ocasionar depsitos de grasa que se acumulan en el interior de las arterias. Las lipoprotenas de alta densidad (HDL) es el colesterol "bueno". Ayuda a remover el colesterol LDL "malo" de la [place]. El colesterol es un factor de riesgo muy importante para la enfermedad cardaca. Otros factores de riesgo son la hipertensin arterial, el hbito de fumar, el estrs, la herencia y [place].   El msculo cardaco obtiene el suministro de sangre a travs de las arterias coronarias. Si su colesterol LDL ("malo") est elevado y el HDL ("bueno") es bajo, tiene un factor de riesgo para que se formen depsitos de Lobbyist en las arterias coronarias (los vasos sanguneos que suministran sangre al corazn). Esto hace que haya menos lugar para que la sangre circule. Sin la suficiente sangre y oxgeno, el msculo cardaco no puede funcionar correctamente, y usted podr sentir dolores en el pecho (angina pectoris). Cuando una arteria coronaria se cierra completamente, una parte del msculo cardaco puede morir (infarto de miocardio).  CONTROL DEL COLESTEROL Cuando el profesional que lo asiste enva la sangre al laboratorio para Armed forces logistics/support/administrative officer nivel de colesterol, puede realizarle tambin un perfil completo de los lpidos. Con esta prueba, se puede determinar la cantidad total de colesterol, as como los niveles de LDL y HDL. Los triglicridos son un tipo de grasa que circula en la sangre y que tambin puede utilizarse para determinar el riesgo de enfermedad cardaca. En la siguiente tabla se establecen los nmeros ideales: Prueba: Colesterol total  Menos de 200 mg/dl.  Prueba: LDL "colesterol malo"  Menos de 100 mg/dl.   Menos de 70 mg/dl si tiene riesgo muy elevado de sufrir un ataque cardaco o muerte cardaca sbita.  Prueba:  HDL "colesterol bueno"  Mujeres: Ms de 50 mg/dl.   Hombres: Ms de 40 mg/dl.  Prueba: Trigliceridos  Menos de [id number] mg/dl.    CONTROL DEL COLESTEROL CON DIETA Aunque factores como el ejercicio y el estilo de vida son importantes, la "primera lnea de ataque" es la dieta. Esto se debe a que se sabe que ciertos alimentos hacen subir el colesterol y otros lo [place]. El objetivo debe ser [employer] alimentos, de modo que tengan un efecto sobre el colesterol y, an ms importante, Training and development officer las grasas saturadas y trans con otros tipos de grasas, como las monoinsaturadas y las poliinsaturadas y cidos grasos omega-3 . En promedio, una persona no debe consumir ms de 15  a 17 g de grasas saturadas por da. Las grasas saturadas y trans se consideran grasas "malas", ya que elevan el colesterol LDL. Las grasas saturadas se encuentran principalmente en productos animales como carne, [place] y crema. Pero esto no significa que usted Regulatory affairs officer todas sus comidas favoritas. Actualmente, como lo muestra el cuadro que figura al final de este documento, hay sustitutos de buen sabor, bajos en grasas y en colesterol, para la mayora de los alimentos que a usted Biomedical engineer. Elija aquellos alimentos alternativos que sean bajos en grasas o sin grasas. Elija cortes de carne del cuarto trasero o lomo ya que estos cortes son los que tienen menor cantidad de grasa y Research officer, trade union. El pollo (sin piel), el pescado, la carne de ternera, y la [place] de [place] molida son excelentes opciones. Elimine las carnes [employer] o el salami. Los [employer] o nada de grasas saturadas. Cuando consuma carne [place], carne de aves de corral, o pescado, hgalo en porciones de 85 gramos (3 onzas). Las grasas trans tambin se llaman "aceites parcialmente hidrogenados". Son aceites manipulados cientficamente de [place] que son slidos a Engineer, water, tienen una larga vida y Therapist, art sabor y la textura de los  alimentos a los que se Pension scheme manager. Las grasas trans se encuentran en la [place], [address], crackers y alimentos horneados.  Para hornear y cocinar, el aceite es un excelente sustituto para la [place]. Los aceites monoinsaturados tienen un beneficio particular, ya que se cree que disminuyen el colesterol LDL (colesterol malo) y elevan el HDL. Deber evitar los aceites tropicales saturados como el de coco y el de [place].  Recuerde, adems, que puede comer sin restricciones los grupos de alimentos que son naturalmente libres de grasas saturadas y Physicist, medical trans, entre los que se incluyen el pescado, las frutas (excepto el [place]), verduras, frijoles, cereales (cebada, arroz, [place], trigo) y las pastas (sin salsas con crema)   IDENTIFIQUE LOS ALIMENTOS QUE DISMINUYEN EL COLESTEROL  Pueden disminuir el colesterol las fibras solubles que estn en las frutas, como las [place], en los vegetales como el brcoli, las patatas y las zanahorias; en las legumbres como frijoles, guisantes y Engineer, technical sales; y en los cereales como la cebada. Los alimentos fortificados con fitosteroles tambin Forensic psychologist. Debe consumir al menos 2 g de estos alimentos a diario para Software engineer de disminucin de [place].  En el supermercado, lea las etiquetas de los envases para identificar los alimentos bajos en grasas saturadas, libres de grasas trans y bajos en [place], . Elija quesos que tengan solo de 2 a 3 g de grasa saturada por onza (28,35 g). Use una margarina que no dae el corazn, [place] de grasas trans o aceite parcialmente hidrogenado. Al comprar alimentos horneados (galletitas dulces y Administrator) evite el aceite parcialmente hidrogenado. Los panes y bollos debern ser de granos enteros (harina de maz o de avena entera, en lugar de "harina" o "harina enriquecida"). Compre sopas en lata que no sean cremosas, con bajo contenido de sal y sin grasas adicionadas.   TCNICAS DE PREPARACIN DE LOS ALIMENTOS    Nunca fra los alimentos en aceite abundante. Si debe frer, hgalo en poco aceite y removiendo [place], porque as se utilizan muy pocas grasas, o utilice un spray antiadherente. Cuando le sea posible, hierva, hornee o ase las carnes y cocine los vegetales al vapor. En vez de [employer] con mantequilla o [place], utilice limn y hierbas, pur de Archivist y canela (para las calabazas  y batatas), yogurt y salsa descremados y aderezos para ensaladas bajos en contenido graso.   BAJO EN GRASAS SATURADAS / SUSTITUTOS BAJOS EN GRASA  [place] / Grasas saturadas (g)  Evite: Bife, corte graso (3 oz/85 g) / 11 g   Elija: Bife, corte magro (3 oz/85 g) / 4 g   Evite: [place] (3 oz/85 g) / 7 g   Elija:  [place] magra (3 oz/85 g) / 5 g   Evite: Jamn (3 oz/85 g) / 6 g   Elija:  Jamn magro (3 oz/85 g) / 2.4 g   Evite: Pollo, con piel (3 oz/85 g), Carne oscura / 4 g   Elija:  Pollo, sin piel (3 oz/85 g), Carne oscura / 2 g   Evite: Pollo, con piel (3 oz/85 g), Carne magra / 2.5 g   Elija: Pollo, sin piel (3 oz/85 g), Carne magra / 1 g  Lcteos / Grasas saturadas (g)  Evite: Leche entera (1 taza) / 5 g   Elija: Leche con bajo contenido de grasa, 2% (1 taza) / 3 g   Elija: Leche con bajo contenido de grasa, 1% (1 taza) / 1.5 g   Elija: Leche descremada (1 taza) / 0.3 g   Evite: Queso duro (1 oz/28 g) / 6 g   Elija: Queso descremado (1 oz/28 g) / 2-3 g   Evite: Queso cottage, 4% grasa (1 taza)/ 6.5 g   Elija: Queso cottage con bajo contenido de grasa, 1% grasa (1 taza)/ 1.5 g   Evite: Helado (1 taza) / 9 g   Elija: Sorbete (1 taza) / 2.5 g   Elija: Yogurt helado sin contenido de grasa (1 taza) / 0.3 g   Elija: Barras de fruta congeladas / vestigios   Evite: Crema batida (1 cucharada) / 3.5 g   Elija: Batidos glac sin lcteos (1 cucharada) / 1 g  Condimentos / Grasas saturadas (g)  Evite: Mayonesa (1 cucharada) / 2 g   Elija: Mayonesa con bajo  contenido de grasa (1 cucharada) / 1 g   Evite: [place] (1 cucharada) / 7 g   Elija: Margarina extra light (1 cucharada) / 1 g   Evite: Aceite de coco (1 cucharada) / 11.8 g   Elija: Aceite de oliva (1 cucharada) / 1.8 g   Elija: Aceite de maz (1 cucharada) / 1.7 g   Elija: Aceite de crtamo (1 cucharada) / 1.2 g   Elija: Aceite de girasol (1 cucharada) / 1.4 g   Elija: Aceite de soja (1 cucharada) / 2.4 g   Elija: Aceite de canola (1 cucharada) / 1 g  Document Released: 06/08/2005 Document Revised: 02/18/2011 [facility] Patient Information 2012 [place], [place].

## 2016-02-27 NOTE — Progress Notes (Signed)
Kristie Hill September 30, 1970 [id number]   History:    45 y.o.  for annual gyn exam who is only complaint is of weight gain and increased appetite since she's been on the 20 g oral contraceptive pill. She reports normal menstrual cycles otherwise. Patient with history of essential hypertension whereby her PCP had increased on Norvasc to 5 mg daily and she is complaining of headaches. She had also been prescribed HCTZ 12.5 mg daily which she has not started. 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 She had a normal ultrasound this year. Patient with past history of hyperprolactinemia denies any nipple discharge or visual disturbances or any headaches. Patient with no prior history of any abnormal Pap smears.  Past medical history,surgical history, family history and social history were all reviewed and documented in the EPIC chart.  Gynecologic History Patient's last menstrual period was 12/21/2015. Contraception: OCP (estrogen/progesterone) Last Pap: 2013 and 2016. Results were: normal Last mammogram: 2016. Results were: normal  Obstetric History OB History  Gravida Para Term Preterm AB Living  5       5 0  SAB TAB Ectopic Multiple Live Births  5            # Outcome Date GA Lbr Len/2nd Weight Sex Delivery Anes PTL Lv  5 SAB           4 SAB           3 SAB           2 SAB           1 SAB                ROS: A ROS was performed and pertinent positives and negatives are included in the history.  GENERAL: No fevers or chills. HEENT: No change in vision, no earache, sore throat or sinus congestion. NECK: No pain or stiffness. CARDIOVASCULAR: No chest pain or pressure. No palpitations. PULMONARY: No shortness of breath, cough or wheeze. GASTROINTESTINAL: No abdominal pain, nausea, vomiting or diarrhea, melena or bright red blood per rectum. GENITOURINARY: No urinary frequency, urgency, hesitancy or dysuria.  MUSCULOSKELETAL: No joint or muscle pain, no back pain, no recent trauma. DERMATOLOGIC: No rash, no itching, no lesions. ENDOCRINE: No polyuria, polydipsia, no heat or cold intolerance. No recent change in weight. HEMATOLOGICAL: No anemia or easy bruising or bleeding. NEUROLOGIC: No headache, seizures, numbness, tingling or weakness. PSYCHIATRIC: No depression, no loss of interest in normal activity or change in sleep pattern.     Exam: chaperone present  BP 134/88   Ht 5\' 4"  (1.626 m)   Wt 202 lb (91.6 kg)   LMP 12/21/2015   BMI 34.67 kg/m   Body mass index is 34.67 kg/m.  General appearance : Well developed well nourished female. No acute distress HEENT: Eyes: no retinal hemorrhage or exudates,  Neck supple, trachea midline, no carotid bruits, no thyroidmegaly Lungs: Clear to auscultation, no rhonchi or wheezes, or rib retractions  Heart: Regular rate and rhythm, no murmurs or gallops Breast:Examined in sitting and supine position were symmetrical in appearance, no palpable masses or tenderness,  no skin retraction, no nipple inversion, no nipple discharge, no skin discoloration, no axillary or supraclavicular lymphadenopathy Abdomen: no palpable masses or tenderness, no rebound or guarding Extremities: no edema or skin discoloration or tenderness  Pelvic:  [place], Urethra, Skene Glands: Within normal limits  Vagina: No gross lesions or discharge  Cervix: No gross lesions or discharge  Uterus  anteverted, normal size, shape and consistency, non-tender and mobile  Adnexa  Without masses or tenderness  Anus and perineum  normal   Rectovaginal  normal sphincter tone without palpated masses or tenderness             Hemoccult not indicated     Assessment/Plan:  45 y.o. female for annual exam with history of essential hypertension. We are going to take her off the Norvasc that she's having headaches and have her start the HCTZ 12.5 mg daily. Patient to return to the  office in 2 weeks to check her blood pressure. I would like for her at home to take blood pressure readings twice a day for 3 times a week for 2 weeks and bring the diary with her which she come to the office for follow-up in 2 weeks. She was offered the flu vaccine today. The following screening blood work was ordered today: Comprehensive at about panel, fasting lipid profile, TSH, CBC, and urinalysis. Because of her past history of hyperprolactinemia we will check her prolactin level today. We are also going to decrease her oral contraceptive pill ethinyl estradiol from 20 g to 10 g and also continue to monitor her blood pressure. I've given literature information on cholesterol lowering diet handout as well as on exercise. I've encouraged also to decrease her salt intake as well as. Pap smear was not indicated this year. She is due for her mammogram in September of this year. 15 minutes additional time from her exam was to discuss and review her medication for hypertension as well as obesity and adjustment of her medications including oral contraceptive pill.     Terrance Mass MD, 9:39 AM 02/27/2016

## 2016-02-28 LAB — URINALYSIS W MICROSCOPIC + REFLEX CULTURE
BILIRUBIN URINE: NEGATIVE
Casts: NONE SEEN [LPF]
Crystals: NONE SEEN [HPF]
GLUCOSE, UA: NEGATIVE
Hgb urine dipstick: NEGATIVE
Ketones, ur: NEGATIVE
LEUKOCYTES UA: NEGATIVE
Nitrite: NEGATIVE
PROTEIN: NEGATIVE
RBC / HPF: NONE SEEN RBC/HPF (ref <=2)
Specific Gravity, Urine: 1.019 (ref 1.001–1.035)
WBC UA: NONE SEEN WBC/HPF (ref <=5)
Yeast: NONE SEEN [HPF]
pH: 5 (ref 5.0–8.0)

## 2016-02-28 LAB — PROLACTIN: PROLACTIN: 12.7 ng/mL

## 2016-03-02 LAB — URINE CULTURE

## 2016-03-05 ENCOUNTER — Telehealth: Payer: Self-pay | Admitting: *Deleted

## 2016-03-05 ENCOUNTER — Other Ambulatory Visit: Payer: Self-pay | Admitting: Anesthesiology

## 2016-03-05 MED ORDER — AMPICILLIN 250 MG PO CAPS: ORAL_CAPSULE | ORAL | 0 refills | Status: DC

## 2016-03-05 NOTE — Telephone Encounter (Signed)
Jody from [facility] called regarding ampicillin directions, correct Rx was sent of ampicillin 250 mg one tablet twice a day for 7 days

## 2016-03-06 ENCOUNTER — Telehealth: Payer: Self-pay | Admitting: *Deleted

## 2016-03-06 MED ORDER — AMPICILLIN 500 MG PO CAPS: ORAL_CAPSULE | ORAL | 0 refills | Status: DC

## 2016-03-06 NOTE — Telephone Encounter (Signed)
Pharmacy called stating ampicillin 250 is unavailable, however the 500 mg dose is available . Can pt have this dose? If not another Rx will need to be prescribed. Please advise

## 2016-03-06 NOTE — Telephone Encounter (Signed)
Rx sent 

## 2016-03-06 NOTE — Telephone Encounter (Signed)
500 mg twice a day for 7 days

## 2016-03-16 ENCOUNTER — Ambulatory Visit (INDEPENDENT_AMBULATORY_CARE_PROVIDER_SITE_OTHER): Payer: BLUE CROSS/BLUE SHIELD | Admitting: Gynecology

## 2016-03-16 ENCOUNTER — Encounter: Payer: Self-pay | Admitting: Gynecology

## 2016-03-16 VITALS — BP 120/70

## 2016-03-16 DIAGNOSIS — M7550 Bursitis of unspecified shoulder: Secondary | ICD-10-CM | POA: Insufficient documentation

## 2016-03-16 DIAGNOSIS — M7552 Bursitis of left shoulder: Secondary | ICD-10-CM

## 2016-03-16 DIAGNOSIS — I10 Essential (primary) hypertension: Secondary | ICD-10-CM | POA: Diagnosis not present

## 2016-03-16 DIAGNOSIS — Z30011 Encounter for initial prescription of contraceptive pills: Secondary | ICD-10-CM

## 2016-03-16 DIAGNOSIS — Z23 Encounter for immunization: Secondary | ICD-10-CM

## 2016-03-16 MED ORDER — PREDNISONE 10 MG (21) PO TBPK: 10.0000 mg | ORAL_TABLET | Freq: Every day | ORAL | 0 refills | Status: DC

## 2016-03-16 MED ORDER — NORETHIN ACE-ETH ESTRAD-FE 1-20 MG-MCG PO TABS: 1.0000 | ORAL_TABLET | Freq: Every day | ORAL | 11 refills | Status: DC

## 2016-03-16 MED ORDER — HYDROCHLOROTHIAZIDE 12.5 MG PO CAPS: 12.5000 mg | ORAL_CAPSULE | Freq: Every day | ORAL | 11 refills | Status: DC

## 2016-03-16 NOTE — Addendum Note (Signed)
Addended by: Thurnell Garbe A on: 03/16/2016 04:41 PM   Modules accepted: Orders

## 2016-03-16 NOTE — Progress Notes (Addendum)
   Patient is a 45 year old that presented to the office today for follow-up on her blood pressure. She was seen the office for her annual exam on September 7. Patient with history of essential hypertension her PCP at increased her Norvasc and she began experiencing headaches. She had also been prescribed HCTZ 12.5 mg daily. Patient also was prescribed a lowered estrogen containing birth control pill with 10 g but it was too expensive so she is back on the 20 g oral contraceptive pill. She was instructed to discontinue the Norvasc and she is no longer having headaches and brought her blood pressure readings with her MRI is systolic was [id number] and the highest diastolic was 80. Patient also been complaining of over a week now of point tenderness and restricted mobility of her left shoulder she denies any recent trauma or injury. She was also requesting flu vaccine.  Exam: Blood pressure 120/70 Gen. appearance well-developed well-nourished female in no complaints. Left arm for range of motion tenderness or hyperextension and one placed in the arm above the head. Slight tenderness over the deltoid area of the suspicious for a mild bursitis.  Assessment/plan: #1 essential hypertension doing well off Norvasc will continue on the HCTZ 12.5 mg daily which she was prescribed. Patient had previously been given literature information on essential hypertension. We discussed importance of a low salt diet and regular exercise program for her to begin to lose weight.  #2 patient will continue on the Janel 1/20 oral contraceptive pill and continue to monitor blood pressures twice a week parameters set for her to follow up. #3 patient requesting flu vaccine administered today #4 for her bursitis she's going to be prescribed a steroid pack for one week  Greater than 90% of time was spent counseling cording care of this patient total time of consultation 25 minutes

## 2016-03-16 NOTE — Patient Instructions (Addendum)
Bursitis (Bursitis) La bursitis es la inflamacin e irritacin de una bursa, una de las pequeas bolsas llenas de lquido que amortiguan y protegen las partes mviles del cuerpo. Estas bolsas se encuentran entre los [employer] y los msculos, los ligamentos musculares o las reas de piel cerca de [place]. La bursa protege estas estructuras del desgaste que resulta del movimiento frecuente. Cuando la bursa se inflama, causa dolor e hinchazn. Puede acumularse lquido dentro de [employer]. La bursitis es ms frecuente cerca de las articulaciones, especialmente de las rodillas, los codos, las caderas y los hombros. CAUSASInfluenza Virus Vaccine (Flucelvax) Qu es este medicamento? La VACUNA ANTIGRIPAL ayuda a disminuir el riesgo de contraer la influenza, tambin conocida como la gripe. La vacuna solo ayuda a protegerle contra algunas cepas de influenza. Este medicamento puede ser utilizado para otros usos; si tiene alguna pregunta consulte con su proveedor de atencin mdica o con su farmacutico. Qu le debo informar a mi profesional de la salud antes de tomar este medicamento? Necesita saber si usted presenta alguno de los siguientes problemas o situaciones: -trastorno de sangrado como hemofilia -fiebre o infeccin -sndrome de Guillain-Barre u otros problemas neurolgicos -problemas del sistema inmunolgico -infeccin por el virus de la inmunodeficiencia humana (VIH) o SIDA -niveles bajos de plaquetas en la sangre -esclerosis mltiple -una reaccin alrgica o inusual a las vacunas antigripales, a otros medicamentos, alimentos, colorantes o conservantes -si est embarazada o buscando quedar embarazada -si est amamantando a un beb Cmo debo utilizar este medicamento? Esta vacuna se administra mediante inyeccin por va intramuscular. Lo administra un profesional de [employer]	[place]. Recibir una copia de informacin escrita sobre la vacuna antes de cada vacuna. Asegrese de leer este folleto cada vez  cuidadosamente. Este folleto puede cambiar con frecuencia. Hable con su pediatra para informarse acerca del uso de este medicamento en nios. Puede requerir atencin especial. Sobredosis: Pngase en contacto inmediatamente con un centro toxicolgico o una sala de urgencia si usted cree que haya tomado demasiado medicamento. ATENCIN: [employer] es solo para usted. No comparta este medicamento con nadie. Qu sucede si me olvido de una dosis? No se aplica en este caso. Qu puede interactuar con este medicamento? -quimioterapia o radioterapia -medicamentos que suprimen el sistema inmunolgico, tales como etanercept, anakinra, infliximab y adalimumab -medicamentos que tratan o previenen cogulos sanguneos, como warfarina -fenitona -medicamentos esteroideos, como la prednisona o la cortisona -teofilina -vacunas Puede ser que esta lista no menciona todas las posibles interacciones. Informe a su profesional de [employer]	[place] de [employer] productos a base de hierbas, medicamentos de [place] o suplementos nutritivos que est tomando. Si usted fuma, consume bebidas alcohlicas o si utiliza drogas ilegales, indqueselo tambin a su profesional de [employer]	[place]. Algunas sustancias pueden interactuar con su medicamento. A qu debo estar atento al usar [employer]? Informe a su mdico o a Barrister's clerk de la [employer] todos los efectos secundarios que persistan despus de 3 das. Llame a su proveedor de atencin mdica si se presentan sntomas inusuales dentro de las 6 semanas de recibir esta vacuna. Es posible que todava pueda contraer la gripe, pero la enfermedad no ser tan fuerte como normalmente. No puede contraer la gripe de esta vacuna. La vacuna antigripal no le protege contra resfros u otras enfermedades que pueden causar [place]. Debe vacunarse cada ao. Qu efectos secundarios puedo tener al [employer] este medicamento? Efectos secundarios que debe informar a su mdico o a Barrister's clerk  de la salud tan pronto como sea posible: -reacciones  alrgicas como erupcin cutnea, picazn o urticarias, hinchazn de la cara, labios o lengua Efectos secundarios que, por lo general, no requieren atencin mdica (debe informarlos a su mdico o a su profesional de la salud si persisten o si son molestos): -fiebre -dolor de cabeza -molestias y dolores musculares -dolor, sensibilidad, enrojecimiento o Estate agent de la inyeccin -cansancio Puede ser que esta lista no menciona todos los posibles efectos secundarios. Comunquese a su mdico por asesoramiento mdico [employer]. Usted puede informar los efectos secundarios a la FDA por telfono al 1-800-FDA-1088. Dnde debo guardar mi medicina? Esta vacuna se administrar por un profesional de la salud en una [place], Engineer, mining, consultorio mdico u otro consultorio de un profesional de la salud. No se le suministrar esta vacuna para guardar en su domicilio. ATENCIN: Este folleto es un resumen. Puede ser que no cubra toda la posible informacin. Si usted tiene preguntas acerca de esta medicina, consulte con su mdico, su farmacutico o su profesional de Technical sales engineer.    2016, Elsevier/Gold Standard. (2011-05-25 16:29:16) Las causas de la bursitis pueden ser las siguientes:   Una lesin debido a:  Un golpe directo, como una cada sobre la rodilla o el codo.  El uso excesivo de una articulacin (esfuerzo repetitivo).  Infeccin. Puede producirse si se introducen bacterias a travs de un corte o un rasguo cerca de una articulacin.  Enfermedades que causan inflamacin articular, como gota y artritis reumatoide. FACTORES DE RIESGO Puede estar en riesgo de bursitis si:   Tiene un trabajo o un pasatiempo que requiera mucho esfuerzo repetitivo en las articulaciones.  Tiene una afeccin que debilite el sistema de defensa (sistema inmunitario) del cuerpo, como diabetes, cncer o VIH.  Levanta objetos y se estira hacia  arriba con frecuencia.  Se arrodilla o se apoya sobre superficies duras con frecuencia.  Corre o camina con frecuencia. [place] signos y sntomas ms frecuentes de la bursitis son los siguientes:  Dolor que empeora cuando [employer] la parte del cuerpo afectada o la carga con peso.  Inflamacin.  Entumecimiento. Otros signos y sntomas pueden incluir los siguientes:  Enrojecimiento.  Sensibilidad.  Calor.  Dolor que contina despus de Production assistant, radio.  Cristy Hilts y escalofros. Esto puede suceder cuando la causa de la bursitis es una infeccin. DIAGNSTICO La bursitis puede diagnosticarse de las siguientes maneras:   Examen fsico e historia clnica.  Resonancia magntica.  Un procedimiento para drenar lquido de la bursa con una aguja (aspiracin). El lquido puede examinarse para detectar signos de infeccin o gota.  Anlisis de [place], para descartar otras causas de inflamacin. TRATAMIENTO  Por lo general, la bursitis puede tratarse en casa con reposo, hielo, compresin y elevacin (RHCE). En el caso de bursitis leve, quizs solo se necesite un tratamiento que incluya reposo, hielo, compresin y Set designer. Otros tratamientos pueden ser los siguientes:  Antiinflamatorios no esteroides Dayna Ramus) para tratar Conservation officer, historic buildings y la inflamacin.  Corticoides para combatir la inflamacin. Pueden inyectarle estos medicamentos en el rea de la bursitis y a su alrededor.  Aspiracin del lquido de la bursitis para Best boy y Community education officer.  Un antibitico para tratar la infeccin de la bursa.  Una frula, una tablilla o un dispositivo para caminar.  Fisioterapia si sigue teniendo dolor o movimientos limitados.  Ciruga para extraer una bursa infectada o daada. Esta puede ser necesaria si tiene un caso de bursitis muy grave o si los otros tratamientos no han funcionado. [place]  EN EL HOGAR   Tome los medicamentos solamente como se lo haya  indicado el mdico.  Si le recetaron antibiticos, asegrese de terminarlos, incluso si comienza a sentirse mejor.  Haga reposo a fin de Arts development officer rea afectada, como se lo haya indicado el mdico.  Mantenga el rea elevada.  Evite las actividades que [employer].  Aplique hielo sobre la zona lesionada.  Coloque el hielo en una bolsa plstica.  Coloque una toalla entre la piel y la bolsa de hielo.  Coloque el hielo durante 20 minutos, 2 a 3 veces por da.  Use la frula, el dispositivo ortopdico, la almohadilla o el dispositivo para caminar como se lo haya indicado el mdico.  Concurra a todas las visitas de control como se lo haya indicado el mdico. Esto es importante. PREVENCIN   Utilice rodilleras si se arrodilla con frecuencia.  Use un calzado que le quede bien y que sea resistente para caminar o Optometrist.  Haga pausas con regularidad durante una actividad repetitiva.  A la hora de precalentar, antes de hacer una actividad intensa, elongue.  Mantenga un peso saludable o pierda peso segn lo que le haya recomendado el mdico. Pida ayuda al mdico si es necesario.  Haga ejercicios regularmente. Comience gradualmente cualquier actividad fsica nueva. SOLICITE ATENCIN MDICA SI:   La bursitis no responde al tratamiento o a los Multimedia programmer.  Tiene fiebre.  Tiene escalofros.   Esta informacin no tiene Marine scientist el consejo del mdico. Asegrese de hacerle al mdico cualquier pregunta que tenga.   Document Released: 03/18/2005 Document Revised: 06/29/2014 Elsevier Interactive Patient Education [employer].

## 2016-06-11 ENCOUNTER — Encounter: Payer: Self-pay | Admitting: Gynecology

## 2016-10-21 ENCOUNTER — Ambulatory Visit (INDEPENDENT_AMBULATORY_CARE_PROVIDER_SITE_OTHER): Payer: BLUE CROSS/BLUE SHIELD | Admitting: Neurology

## 2016-10-21 ENCOUNTER — Encounter: Payer: Self-pay | Admitting: Neurology

## 2016-10-21 ENCOUNTER — Encounter (INDEPENDENT_AMBULATORY_CARE_PROVIDER_SITE_OTHER): Payer: Self-pay

## 2016-10-21 DIAGNOSIS — G43119 Migraine with aura, intractable, without status migrainosus: Secondary | ICD-10-CM

## 2016-10-21 HISTORY — DX: Migraine with aura, intractable, without status migrainosus: G43.119

## 2016-10-21 MED ORDER — TOPIRAMATE 25 MG PO TABS: ORAL_TABLET | ORAL | 3 refills | Status: DC

## 2016-10-21 NOTE — Progress Notes (Signed)
Reason for visit: Migraine headache  Referring physician: Dr. [name] is a 46 y.o. female  History of present illness:  Kristie Hill is a 46 year old right-handed Hispanic female with a history of headaches that began around the age of 54. The patient did not have headaches when she was growing up. She was told that she was going through early menopause at that time, and the headaches have gradually worsened over time. She had a CT scan of the brain done in 2012 that was unremarkable. The patient has recently developed almost daily headaches over the last 2 or 3 months. She reports that her headaches are worse on the right side the head than on the left, and may be associated with flashing lights or dark spots in the vision, worse on the right side. The patient has blurring of vision as well. She denies any nausea or vomiting, but she does report photophobia and some phonophobia, odors are quite bothersome to her during the headache. The patient indicates that stress may worsen the headache. The patient indicates that sleep will help the headache. She denies any other known activators for the headache. She denies any family history of headache. She has occasional tingling in the fingers, she denies any weakness. She has been taking Excedrin Migraine if needed, she just recently was given rizatriptan for the headache, but she has not yet taken it. She quit drinking coffee 3 weeks ago, otherwise she does not take in any caffeinated products. She is sent to this office for further evaluation.  Past Medical History:  Diagnosis Date  . Blood type, Rh negative    O negative  . Hormone disorder 2012   hyperprolactinemia  . No pertinent past medical history   . Oligomenorrhea   . Recurrent pregnancy loss     Past Surgical History:  Procedure Laterality Date  . DILATION AND CURETTAGE OF UTERUS  01/07/07  . DILATION AND CURETTAGE OF UTERUS  10/01/10   D&E  .  DILATION AND CURETTAGE OF UTERUS    . LAPAROSCOPY  04/19/2012   Procedure: LAPAROSCOPY OPERATIVE;  Surgeon: Terrance Mass, MD;  Location: [facility];  Service: Gynecology;  Laterality: Right;  Excision of right para-tubal mass. Aspiration of Left ovarian cyst, pelvic washings     Family History  Problem Relation Age of Onset  . Heart disease Father   . Hypertension Father   . Cancer Paternal Grandmother     BRAIN  . Cancer Paternal Grandfather     LUNG  . Hypertension Mother     Social history:  reports that she has never smoked. She has never used smokeless tobacco. She reports that she drinks alcohol. She reports that she does not use drugs.  Medications:  Prior to Admission medications   Medication Sig Start Date End Date Taking? Authorizing Provider  hydrochlorothiazide (MICROZIDE) 12.5 MG capsule Take 1 capsule (12.5 mg total) by mouth daily. 03/16/16  Yes Terrance Mass, MD  norethindrone-ethinyl estradiol (JUNEL FE,GILDESS FE,LOESTRIN FE) 1-20 MG-MCG tablet Take 1 tablet by mouth daily. 03/16/16  Yes Terrance Mass, MD     No Known Allergies  ROS:  Out of a complete 14 system review of symptoms, the patient complains only of the following symptoms, and all other reviewed systems are negative.  Blurred vision, eye pain Headache  Blood pressure (!) 135/91, pulse 98, height 5\' 3"  (1.6 m), weight 199 lb 8 oz (90.5 kg).  Physical Exam  General: The patient  is alert and cooperative at the time of the examination. The patient is moderately obese.  Eyes: Pupils are equal, round, and reactive to light. Discs are flat bilaterally.  Neck: The neck is supple, no carotid bruits are noted.  Respiratory: The respiratory examination is clear.  Cardiovascular: The cardiovascular examination reveals a regular rate and rhythm, no obvious murmurs or rubs are noted.  Skin: Extremities are without significant edema.  Neurologic Exam  Mental status: The patient is alert and  oriented x 3 at the time of the examination. The patient has apparent normal recent and remote memory, with an apparently normal attention span and concentration ability.  Cranial nerves: Facial symmetry is present. There is good sensation of the face to pinprick and soft touch bilaterally. The strength of the facial muscles and the muscles to head turning and shoulder shrug are normal bilaterally. Speech is well enunciated, no aphasia or dysarthria is noted. Extraocular movements are full. Visual fields are full. The tongue is midline, and the patient has symmetric elevation of the soft palate. No obvious hearing deficits are noted.  Motor: The motor testing reveals 5 over 5 strength of all 4 extremities. Good symmetric motor tone is noted throughout.  Sensory: Sensory testing is intact to pinprick, soft touch, vibration sensation, and position sense on all 4 extremities. No evidence of extinction is noted.  Coordination: Cerebellar testing reveals good finger-nose-finger and heel-to-shin bilaterally.  Gait and station: Gait is normal. Tandem gait is normal. Romberg is negative. No drift is seen.  Reflexes: Deep tendon reflexes are symmetric and normal bilaterally. Toes are downgoing bilaterally.   Assessment/Plan:  1. Classic migraine headache  The patient is having frequent headaches at this point, she will be placed on Topamax on a daily basis, she may take the rizatriptan if needed. She will follow-up in 3 months, she will call for any dose adjustments of the medication. The examination today was done with the help of an interpreter.  Jill Alexanders MD 10/21/2016 3:03 PM  [facility] [address] [place], [place] [address]  Phone [phone] Fax [phone]

## 2016-11-04 ENCOUNTER — Encounter: Payer: Self-pay | Admitting: Gynecology

## 2016-12-22 ENCOUNTER — Ambulatory Visit (INDEPENDENT_AMBULATORY_CARE_PROVIDER_SITE_OTHER): Payer: BLUE CROSS/BLUE SHIELD | Admitting: Gynecology

## 2016-12-22 ENCOUNTER — Encounter: Payer: Self-pay | Admitting: Gynecology

## 2016-12-22 VITALS — BP 132/80

## 2016-12-22 DIAGNOSIS — Z8742 Personal history of other diseases of the female genital tract: Secondary | ICD-10-CM | POA: Insufficient documentation

## 2016-12-22 DIAGNOSIS — R102 Pelvic and perineal pain: Secondary | ICD-10-CM

## 2016-12-22 LAB — URINALYSIS W MICROSCOPIC + REFLEX CULTURE
BILIRUBIN URINE: NEGATIVE
CASTS: NONE SEEN [LPF]
Crystals: NONE SEEN [HPF]
Glucose, UA: NEGATIVE
KETONES UR: NEGATIVE
Leukocytes, UA: NEGATIVE
NITRITE: NEGATIVE
PH: 7 (ref 5.0–8.0)
Protein, ur: NEGATIVE
SPECIFIC GRAVITY, URINE: 1.02 (ref 1.001–1.035)
Yeast: NONE SEEN [HPF]

## 2016-12-22 LAB — PREGNANCY, URINE: Preg Test, Ur: NEGATIVE

## 2016-12-22 MED ORDER — HYDROCHLOROTHIAZIDE 12.5 MG PO CAPS: 12.5000 mg | ORAL_CAPSULE | Freq: Every day | ORAL | 11 refills | Status: DC

## 2016-12-22 MED ORDER — KETOROLAC TROMETHAMINE 30 MG/ML IJ SOLN
30.0000 mg | Freq: Once | INTRAMUSCULAR | Status: AC
  Administered 2016-12-22: 30 mg via INTRAVENOUS

## 2016-12-22 MED ORDER — KETOROLAC TROMETHAMINE 10 MG PO TABS: 10.0000 mg | ORAL_TABLET | Freq: Four times a day (QID) | ORAL | 0 refills | Status: DC | PRN

## 2016-12-22 MED ORDER — NORETHIN ACE-ETH ESTRAD-FE 1-20 MG-MCG PO TABS: 1.0000 | ORAL_TABLET | Freq: Every day | ORAL | 11 refills | Status: DC

## 2016-12-22 NOTE — Progress Notes (Signed)
   Patient is a 46 year old that presented to the office stating that every 6 hours complaining of on and off right lower quadrant pain. Patient's did have some nausea but no vomiting back pain fever chills. She denies any frequency or dysuria. She is on a 20 g oral contraceptive pill reports no menstrual cycle since December. She does have history of hypertension for which she's on HCTZ 12.5 mg daily. Patient has had history in the past ovarian cyst.  Exam: Gen. appearance well-developed or nursing no with the above-mentioned complaint Back: No CVA tenderness Abdomen: Soft nontender no rebound or guarding Pelvic: Bartholin urethra Skene was within normal limits Vagina: No lesions or discharge Cervix no lesions or discharge Uterus: Anteverted upper limits of normal nontender left adnexa no palpable mass or tenderness right adnexa tenderness no rebound or guarding no large masses palpated Rectal exam not done  Urine pregnancy test was negative  Urinalysis trace blood few bacteria culture pending  Assessment/plan: Patient with past history of ovarian cyst currently on oral contraceptive pill and negative urine pregnancy test. Patient to receive Toradol 30 mg IM today and a prescription for Toradol 10 mg to take 1 by mouth every 6 hours for the next 3-5 days when necessary. We'll schedule an ultrasound for follow-up in the next week for so. Urine culture pending.

## 2016-12-22 NOTE — Patient Instructions (Addendum)
Ketorolac tablets Qu es este medicamento? El QUETOROLAC es un medicamento antiinflamatorio no esteroideo (AINE). Se utiliza para tratar [employer] leves a moderados a Control and instrumentation engineer, Geophysicist/field seismologist postoperatorio. No debe utilizarlo durante ms de 5 das. Este medicamento puede ser utilizado para otros usos; si tiene alguna pregunta consulte con su proveedor de atencin mdica o con su farmacutico. MARCAS COMUNES: Toradol Qu le debo informar a mi profesional de la salud antes de tomar este medicamento? Necesita saber si usted presenta alguno de los [employer] o situaciones: -asma -problemas de sangrado, como hemofilia -fuma -consume ms de 3 bebidas alcohlicas por da -enfermedad cardiaca o problemas circulatorios, tales como insuficiencia cardiaca o edema de pierna (retencin de lquido) -alta presin sangunea -enfermedad renal -enfermedad heptica -lceras o sangrado estomacal -una reaccin alrgica o inusual al quetorolac, a la aspirina, a otros AINE, a otros medicamentos alimentos, colorantes o conservantes -si est embarazada o buscando quedar embarazada -si est amamantando a un beb Cmo debo utilizar este medicamento? Tome este medicamento por va oral con un vaso lleno de agua. Siga las instrucciones de la etiqueta del [place]. Tome sus dosis a intervalos regulares. No tome su medicamento con una frecuencia mayor a la indicada. No tome ms que la dosis recomendada. Su farmacutico le dar una [place] del medicamento especial con cada receta y relleno. Asegrese de leer esta informacin cada vez cuidadosamente. Hable con su pediatra para informarse acerca del uso de este medicamento en nios. Aunque este medicamento ha sido recetado a nios tan menores como de 16 aos de edad para condiciones selectivas, las precauciones se aplican. Los pacientes de ms de 65 aos de edad pueden presentar reacciones ms fuertes y Designer, industrial/product dosis [employer]. Sobredosis: Pngase en  contacto inmediatamente con un centro toxicolgico o una sala de urgencia si usted cree que haya tomado demasiado medicamento. ATENCIN: [employer] es solo para usted. No comparta este medicamento con nadie. Qu sucede si me olvido de una dosis? Si olvida una dosis, tmela lo antes posible. Si es casi la hora de la prxima dosis, tome slo esa dosis. No tome dosis adicionales o dobles. Qu puede interactuar con este medicamento? No tome esta medicina con ninguno de los siguientes medicamentos: -aspirina y medicamentos tipo aspirina -cidofovir -metotrexato -los [place], medicamentos para Conservation officer, historic buildings o inflamacin, como ibuprofeno o naproxeno -pentoxifilina -probenecid Esta medicina tambin puede interactuar con los siguientes medicamentos: -alcohol -alendronato -alprazolam -carbamazepina -ciclosporina -diurticos -flavocoxid -fluoxetina -ginkgo -litio -medicamentos para la alta presin sangunea como enalapril -medicamentos que afectan las plaquetas como pentoxifilina -medicamentos que tratan o previenen cogulos sanguneos, como heparina, warfarina -relajantes musculares -fenitona -medicamentos esteroideos, como la prednisona o la cortisona -tiotixeno Puede ser que esta lista no menciona todas las posibles interacciones. Informe a su profesional de KB Home	[place] de [employer] productos a base de hierbas, medicamentos de [place] o suplementos nutritivos que est tomando. Si usted fuma, consume bebidas alcohlicas o si utiliza drogas ilegales, indqueselo tambin a su profesional de KB Home	[place]. Algunas sustancias pueden interactuar con su medicamento. A qu debo estar atento al usar Coca-Cola? Si el dolor no mejora, informe a su mdico o a su profesional de KB Home	[place]. Consulte con su mdico antes de tomar otros analgsicos. No se trate usted mismo. Este medicamento no previene ataques cardiacos o derrames cerebrales. De hecho, este medicamento puede aumentar la posibilidad de  Insurance risk surveyor un ataque cardiaco o un derrame cerebral. La posibilidad puede aumentar con el uso prolongado de este medicamento y en pacientes con  enfermedad cardiaca. Si est tomando aspirina para la prevencin de ataques cardiacos o derrames cerebrales, comunquese con su mdico o su profesional de KB Home	[place]. Evite tomar medicamentos, tales como ibuprofeno y naproxeno con Fish farm manager. Es probable que se Pensions consultant secundarios, tales como molestias estomacales, nuseas o lceras. No debe de tomar Coca-Cola con muchos medicamentos disponibles de [employer]. Este medicamento puede provocar lceras y hemorragia del estmago e intestinos en cualquier momento durante tratamiento. No fume ni ingiera alcohol. Esto irrita an ms el estmago y puede hacerlo ms susceptible a dao por el uso de Coca-Cola. Pueden ocurrir lceras y hemorragia sin sntomas de Hydrographic surveyor y Futures trader la [place]. Puede experimentar mareos o somnolencia. No conduzca ni utilice maquinaria ni haga nada que Associate Professor en estado de alerta hasta que sepa cmo le afecta este medicamento. No se siente ni se ponga de pie con rapidez, especialmente si es un paciente de edad avanzada. Esto reduce el riesgo de mareos o [employer]. Este medicamento puede hacerle sangrar con mayor facilidad. Trate de no lastimarse los dientes y las encas al cepillarlos o limpiarlos con hilo dental. Qu efectos secundarios puedo tener al [employer] este medicamento? Efectos secundarios que debe informar a su mdico o a Barrister's clerk de la salud tan pronto como sea posible: Chief of Staff, como erupcin cutnea, comezn/picazn o urticarias, e hinchazn de la cara, los labios o la lengua problemas respiratorios presin sangunea alta nuseas, vmito enrojecimiento, formacin de ampollas, descamacin o distensin de la piel, incluso dentro de la boca dolor estomacal severo signos y sntomas de Teacher, music, tales como heces con sangre o de  color negro y aspecto alquitranado; [place] de color rojo o marrn oscuro; escupir sangre o material marrn que tiene el aspecto de granos de caf molido; Tree surgeon rojas en la piel; sangrado o moretones inusuales en los ojos, las encas o la nariz signos y sntomas de un derrame cerebral, tales como cambios en la visin; confusin; dificultad para hablar o entender; dolores de cabeza severos; entumecimiento o debilidad repentina de la cara, el brazo o la pierna; problemas al caminar; Chief of Staff; prdida del equilibrio o la coordinacin dificultad para Garment/textile technologist o cambios en el volumen de orina aumento de peso o hinchazn inexplicables cansancio o debilidad inusual color amarillento de los ojos o la piel Efectos secundarios que generalmente no requieren atencin mdica (infrmelos a su mdico o a Barrister's clerk de la salud si persisten o si son molestos): diarrea mareos dolor de cabeza Merchant navy officer Puede ser que esta lista no menciona todos los posibles efectos secundarios. Comunquese a su mdico por asesoramiento mdico [employer]. Usted puede informar los efectos secundarios a la FDA por telfono al 1-800-FDA-1088. Dnde debo guardar mi medicina? Mantngala fuera del alcance de los nios. Gurdela a [employer], entre 20 y 17 grados C (39 y 28 grados F). Deseche todo el medicamento que no haya utilizado, despus de la fecha de vencimiento. ATENCIN: Este folleto es un resumen. Puede ser que no cubra toda la posible informacin. Si usted tiene preguntas acerca de esta medicina, consulte con su mdico, su farmacutico o su profesional de Technical sales engineer.  2018 Elsevier/Gold Standard (2016-07-09 00:00:00)   Hemorroides (Hemorrhoids) Las hemorroides son venas inflamadas adentro o alrededor del recto o del ano. Hay dos tipos de hemorroides:  Hemorroides internas. Se forman en las venas del interior del recto. Pueden abultarse hacia afuera, irritarse y doler.  Hemorroides externas. Se  producen en las venas externas del ano  y pueden sentirse como un bulto o zona hinchada, dura y dolorosa cerca del ano. [place] hemorroides no causan problemas graves y se Engineer, petroleum con tratamientos caseros [employer] cambios en la dieta y el estilo de vida. Si los tratamientos caseros no ayudan con los sntomas, se pueden Optometrist procedimientos para reducir o extirpar las hemorroides. CAUSAS La causa de esta afeccin es el aumento de la presin en la zona anal. Esta presin puede ser causada por distintos factores, por ejemplo:  Estreimiento.  Dificultad para defecar.  Diarrea.  Embarazo.  Obesidad.  Estar sentado durante largos perodos.  Levantar objetos pesados u otras actividades que impliquen esfuerzo.  Sexo anal. SNTOMAS Los sntomas de esta afeccin incluyen lo siguiente:  Dolor.  Picazn o irritacin anal.  Sangrado rectal.  Prdida de materia fecal (heces).  Inflamacin anal.  Uno o ms bultos alrededor del ano. DIAGNSTICO Esta afeccin se diagnostica frecuentemente a travs de un examen visual. Posiblemente le realicen otros tipos de pruebas o estudios, como los siguientes:  Examen de la zona rectal con una mano enguantada (examen digital rectal).  Examen del canal anal utilizando un pequeo tubo (anoscopio).  Anlisis de sangre si ha perdido [place] cantidad significativa de [place].  Un estudio para observar el interior del colon (sigmoidoscopia o colonoscopia). TRATAMIENTO Esta afeccin generalmente se puede tratar en el hogar. Se pueden realizar diversos procedimientos si los cambios en la dieta, en el estilo de vida y otros tratamientos caseros no [employer]. Estos procedimientos pueden ayudar a reducir o [place] hemorroides completamente. Algunos de estos procedimientos son quirrgicos y otros no. Algunos de los procedimientos ms frecuentes son los siguientes:  Ligadura con Forensic psychologist. Las bandas elsticas se colocan en  la base de las hemorroides para interrumpir la irrigacin de [place].  Escleroterapia. Se inyecta un medicamento en las hemorroides para reducir su tamao.  Coagulacin con luz infrarroja. Se utiliza un tipo de energa lumnica para eliminar las hemorroides.  Hemorroidectoma. Las hemorroides se extirpan con [place] y las venas que las [place] se IT consultant.  Hemorroidopexia con grapas. [place] un dispositivo tipo grapa de forma circular para extirpar las hemorroides y unas grapas para cortar la sangre que se irriga hacia las hemorroides. INSTRUCCIONES PARA EL CUIDADO EN EL HOGAR Comida y bebida  Consuma alimentos con alto contenido de [place], como cereales integrales, porotos, frutos secos, frutas y verduras. Pregntele a su mdico acerca de tomar productos con fibra aadida en ellos (complementos de fibra).  Beba suficiente lquido para Consulting civil engineer orina clara o de color amarillo plido. Control del dolor y la hinchazn  Tome baos de asiento tibios durante 20 minutos, 3 o 4 veces por da para Glass blower/designer y las [place].  Si se lo indican, aplique hielo en la zona afectada. Usar compresas de [employer] baos de asiento puede ser [place]. ? Ponga el hielo en una bolsa plstica. ? Coloque una [employer] piel y la bolsa de hielo. ? Coloque el hielo durante 20 minutos, 2 a 3 veces por da. Instrucciones generales  [employer] de venta libre y los recetados solamente como se lo haya indicado el mdico.  Aplquese los medicamentos, cremas o supositorios como se lo hayan indicado.  Haga ejercicios regularmente.  Vaya al bao cuando sienta la necesidad de defecar. No espere.  Evite hacer fuerza al defecar.  Mantenga la zona anal limpia y seca. Use papel higinico hmedo o toallitas humedecidas despus de defecar.  No pase mucho tiempo sentado en el inodoro. Esto aumenta la afluencia de sangre y Conservation officer, historic buildings. SOLICITE ATENCIN MDICA SI:  Aumenta el dolor y la hinchazn,  y no puede controlarlos con los medicamentos o con Lexicographer.  Tiene una hemorragia que no IT consultant.  No puede defecar o lo hace con dificultad.  Siente dolor o tiene inflamacin fuera de la zona de las hemorroides.  Esta informacin no tiene Marine scientist el consejo del mdico. Asegrese de hacerle al mdico cualquier pregunta que tenga. Document Released: 06/08/2005 Document Revised: 09/30/2015 Document Reviewed: 02/20/2015 Elsevier Interactive Patient Education  2017 [employer].

## 2016-12-22 NOTE — Addendum Note (Signed)
Addended by: Burnett Kanaris on: 12/22/2016 03:25 PM   Modules accepted: Orders

## 2016-12-24 LAB — URINE CULTURE: Organism ID, Bacteria: NO GROWTH

## 2017-01-06 ENCOUNTER — Ambulatory Visit (INDEPENDENT_AMBULATORY_CARE_PROVIDER_SITE_OTHER): Payer: BLUE CROSS/BLUE SHIELD | Admitting: Gynecology

## 2017-01-06 ENCOUNTER — Encounter: Payer: Self-pay | Admitting: Gynecology

## 2017-01-06 ENCOUNTER — Ambulatory Visit (INDEPENDENT_AMBULATORY_CARE_PROVIDER_SITE_OTHER): Payer: BLUE CROSS/BLUE SHIELD

## 2017-01-06 VITALS — BP 118/74

## 2017-01-06 DIAGNOSIS — N83202 Unspecified ovarian cyst, left side: Secondary | ICD-10-CM | POA: Diagnosis not present

## 2017-01-06 DIAGNOSIS — R102 Pelvic and perineal pain: Secondary | ICD-10-CM | POA: Diagnosis not present

## 2017-01-06 DIAGNOSIS — Z8742 Personal history of other diseases of the female genital tract: Secondary | ICD-10-CM

## 2017-01-06 NOTE — Progress Notes (Signed)
   Patient is a 46 year old presented to the office today for follow-up ultrasound. She was seen in the office on July 3 complaining of low abdominal pain right lower quadrant she states she had some nausea but no vomiting. She had no GU complaints. She is on a 20 g oral contraceptive pill and reports no menstrual cycle since December 2017. We did do an office urine pregnancy test which was negative. She does have history of hypertension for which she's currently on HCTZ 12.5 mg by mouth daily. Review of her past ultrasounds dating back since 2013 have indicated she has had simple functional cysts alternating sides since that time. She is here for follow-up ultrasound.  Ultrasound today uterus measures 7.8 x 4.7 x 2.8 cm with endometrial stripe of 3.5 mm. Right ovary appears to be normal. A left ovarian echo-free thinwall avascular cyst measuring 34 x 26 x 25 mm (28 mm average size) no free fluid in the cul-de-sac.  Patient was otherwise asymptomatic today  Assessment/plan: Patient with past history of follicular cyst on her ovary. On 20 g oral contraceptive pill. Patient is somewhat anxious so we did a CA 125 today patient aware of its limitations. Patient was instructed to return back in January which is due for her annual exam.

## 2017-01-06 NOTE — Patient Instructions (Signed)
Marcador tumoral CA-125 (CA-125 Tumor Marker Test) POR QU ME DEBO REALIZAR ESTA PRUEBA? Esta prueba tiene Radiographer, therapeutic nivel del antgeno del cncer125 (CA-125) en la sangre. La prueba de marcador tumoral CA-125 puede ser til para Public affairs consultant de ovario y se realiza solamente si se considera que la mujer corre un alto riesgo de presentar este tipo de Hotel manager. El mdico puede recomendarle esta prueba si:  Usted tiene importantes antecedentes familiares de cncer de ovario.  Usted tiene un defecto gentico en el antgeno del cncer de mama (BRCA, por sus siglas en ingls). Si ya le han diagnosticado cncer de ovario, el mdico puede hacerle esta prueba como ayuda para identificar el grado de la enfermedad y Chief Technology Officer cmo responde al tratamiento. QU TIPO DE MUESTRA SE TOMA? Para esta prueba, se extrae Truddie Coco de [place]. Por lo general, para extraerla, se introduce una aguja en una vena. [place]? No se requiere una preparacin para esta prueba. [place]? Los valores de referencia son los valores saludables establecidos despus de realizarle el anlisis a un grupo grande de personas sanas. Pueden variar [employer], laboratorios y hospitales. Es su responsabilidad retirar el resultado del [place]. Consulte en el laboratorio o en el departamento en el que fue realizado el estudio cundo y cmo podr [employer]. El valor de referencia para esta prueba es de 0a 35unidades/ml o menos de 35unidades/l (unidades SI). [place]? Los niveles ms altos de CA-125 pueden indicar lo siguiente:  Ciertos tipos de cncer, entre ellos: ? Cncer de ovario. ? Cncer de pncreas. ? Cncer de colon. ? Cncer de pulmn. ? Cncer de mama. ? Linfomas.  Trastornos no cancerosos (benignos), entre ellos: ? Cirrosis. ? Embarazo. ? Endometriosis. ? Pancreatitis. ? Enfermedad plvica  inflamatoria (EPI). Hable con el mdico [employer], las opciones de tratamiento y, si es necesario, la necesidad de Optometrist ms [place]. Hable con el mdico si tiene [employer]. Esta informacin no tiene Marine scientist el consejo del mdico. Asegrese de hacerle al mdico cualquier pregunta que tenga. Document Released: 03/29/2013 Document Revised: 06/29/2014 Document Reviewed: 10/26/2013 Elsevier Interactive Patient Education  2018 [place] ovrico (Ovarian Cyst) Un quiste ovrico es una bolsa llena de lquido que se forma en el ovario. Los ovarios son los rganos pequeos que producen vulos en las mujeres. Se pueden formar varios tipos de [employer]. Algunos pueden provocar sntomas y requerir Clinical research associate. La mayora de los quistes ovricos desaparecen por s solos, no son cancerosos (son benignos) y no causan problemas. Los tipos ms comunes de quistes ovricos son los siguientes:  Quistes funcionales (folculos). ? Ocurren durante el ciclo menstrual y suelen desaparecer con el siguiente ciclo menstrual si no queda embarazada. ? No suelen causar sntomas.  Endometriomas. ? Son quistes formados por el tejido que recubre el tero (endometrio). ? A veces se denominan "quistes de chocolate" porque se llenan de sangre que se vuelve marrn. ? Este tipo de quiste puede provocar dolor en la zona inferior del abdomen durante la relacin sexual y el perodo menstrual.  Cistoadenomas. ? Se desarrollan a partir de clulas que se encuentran en la superficie externa del ovario. ? Pueden agrandarse mucho y causar dolor en la zona inferior del abdomen y con la relacin sexual. ? Pueden provocar dolor intenso si se tuercen o se rompen (ruptura).  Quistes dermoides. ? A veces se encuentran en  ambos ovarios. ? Estos quistes pueden [employer]'s tipos de tejidos del organismo, como piel, dientes, pelo o Database administrator. ? Generalmente no  tienen sntomas, a menos que sean muy grandes.  Quistes tecalutesticos. ? Aparecen cuando se produce demasiada cantidad de cierta hormona (gonadotropina corinica humana) que estimula en exceso al ovario para que produzca vulos. ? Son ms frecuentes despus de procedimientos que ayudan a la concepcin de un beb (fertilizacin in vitro). CAUSAS Algunas de las causas de los quistes ovricos son las siguientes:  Sndrome de hiperestimulacin ovrica. Esta es una afeccin que puede aparecer debido a la toma de medicamentos para la fertilidad. Provoca la formacin de varios quistes ovricos grandes.  Sndrome de ovario poliqustico (SOP). Este es un trastorno hormonal frecuente que puede producir quistes ovricos, adems de problemas en el perodo o la fertilidad. [place] factores pueden hacer que usted sea ms propensa a desarrollar quistes ovricos:  Tener exceso de [place] u obesidad.  Tomar medicamentos para la fertilidad.  Usar ciertas formas de regulacin hormonal de la natalidad.  Fumar. SNTOMAS Muchos quistes ovricos no causan sntomas. Si se presentan sntomas, estos pueden ser:  Dolor o molestias en la pelvis.  Dolor en la parte baja del abdomen.  [place].  Hinchazn abdominal.  Perodos menstruales anormales.  Aumento del [employer] perodos [place]. DIAGNSTICO Estos quistes se descubren comnmente durante un examen de rutina o una exploracin ginecolgica. Puede realizarse exmenes para obtener ms informacin sobre el [place], como los siguientes:  Magazine features editor.  Radiografas de la pelvis.  Tomografa computarizada (TC).  Resonancia magntica (RM).  Anlisis de [place]. TRATAMIENTO Muchos de los quistes ovricos desaparecen por s solos, sin tratamiento. Es probable que el mdico quiera controlar el quiste regularmente durante 2 o 87mses para ver si se produce algn cambio. Si est en la menopausia,  es especialmente importante controlar el quiste cuidadosamente porque las mujeres menopusicas tienen un ndice mayor de cncer de ovario. Si se necesita tratamiento, este puede incluir lo siguiente:  Tomar medicamentos para aBest boy  Un procedimiento para drenar el quiste (aspiracin).  Ciruga para extirpar el quiste completo.  Tratamiento hormonal o pldoras anticonceptivas. Estos mtodos a veces se usan para ayudar a dWriter IKanaugalos medicamentos de venta libre y los recetados solamente como se lo haya indicado el mdico.  No conduzca ni use maquinaria pesada mientras toma analgsicos recetados.  Realcese exmenes plvicos peridicos y pruebas de Papanicolaou con la frecuencia que le indique el mdico.  Retome sus actividades normales como se lo haya indicado el mdico. Pregntele al mdico qu actividades son seguras para usted.  No consuma ningn producto que contenga tabaco o nicotina, como cigarrillos y cPsychologist, sport and exercise Si necesita ayuda para dejar de fumar, consulte al mdico.  Concurra a todas las visitas de control como se lo haya indicado el mdico. Esto es importante. SOLICITE ATENCIN MDICA SI:  Los perodos se atrasan, son irregulares, dolorosos o cesan.  Tiene dolor plvico que no desaparece.  Siente presin en la vejiga o no puede vaciarla completamente.  Siente dolor durante las rOffice Depot  Tiene alguno de los siguientes sntomas en el abdomen: ? Sensacin de tEnvironmental health practitionerlleno. ? Presin. ? Molestias. ? Dolor que persiste. ? Hinchazn.  Siente un mPharmacist, hospital  Tiene estreimiento.  Pierde el apetito.  Presenta acn grave.  Empieza a tener ms bello corporal y facial.  Aumenta o disminuye  de peso sin hacer modificaciones en su actividad fsica y en su dieta habitual.  Cree que puede estar [place]. SOLICITE ATENCIN MDICA DE INMEDIATO  SI:  Tiene un dolor abdominal intenso o que empeora.  No puede comer ni beber sin vomitar.  Tiene fiebre de [place] repentina.  Su perodo menstrual es mucho ms profuso que lo normal. Esta informacin no tiene Marine scientist el consejo del mdico. Asegrese de hacerle al mdico cualquier pregunta que tenga. Document Released: 03/18/2005 Document Revised: 06/13/2013 Document Reviewed: 11/10/2015 Elsevier Interactive Patient Education  [employer].

## 2017-01-07 LAB — CA 125: CA 125: 9 U/mL (ref <35)

## 2017-01-27 ENCOUNTER — Ambulatory Visit (INDEPENDENT_AMBULATORY_CARE_PROVIDER_SITE_OTHER): Payer: BLUE CROSS/BLUE SHIELD | Admitting: Neurology

## 2017-01-27 ENCOUNTER — Encounter: Payer: Self-pay | Admitting: Neurology

## 2017-01-27 VITALS — BP 128/81 | HR 91 | Ht 63.0 in | Wt 202.5 lb

## 2017-01-27 DIAGNOSIS — G43119 Migraine with aura, intractable, without status migrainosus: Secondary | ICD-10-CM | POA: Diagnosis not present

## 2017-01-27 MED ORDER — TOPIRAMATE 50 MG PO TABS: 50.0000 mg | ORAL_TABLET | Freq: Every day | ORAL | 1 refills | Status: DC

## 2017-01-27 MED ORDER — KETOROLAC TROMETHAMINE 10 MG PO TABS: 10.0000 mg | ORAL_TABLET | Freq: Four times a day (QID) | ORAL | 3 refills | Status: DC | PRN

## 2017-01-27 NOTE — Progress Notes (Signed)
Reason for visit: Migraine headache  Kristie Hill is an 46 y.o. female  History of present illness:  Kristie Hill is a 46 year old Hispanic female, right handed with a history of migraine headaches. She was having daily headaches, she has been placed on Topamax working up to 75 mg at night. Her headaches have been markedly improved, she is only having 1 a week and it is easily controlled with use of Toradol. The patient is not having any periods of being incapacitated because of the headache. The patient is sleeping well but she reports significant problems with fatigue on the current dose of the Topamax. She denies any significant problems with tingling in the fingers or toes or cognitive changes. She returns to this office for an evaluation. The history is taken through an interpreter.  Past Medical History:  Diagnosis Date  . Blood type, Rh negative    O negative  . Classical migraine with intractable migraine 10/21/2016  . Hormone disorder 2012   hyperprolactinemia  . No pertinent past medical history   . Oligomenorrhea   . Recurrent pregnancy loss     Past Surgical History:  Procedure Laterality Date  . DILATION AND CURETTAGE OF UTERUS  01/07/07  . DILATION AND CURETTAGE OF UTERUS  10/01/10   D&E  . DILATION AND CURETTAGE OF UTERUS    . LAPAROSCOPY  04/19/2012   Procedure: LAPAROSCOPY OPERATIVE;  Surgeon: Terrance Mass, MD;  Location: [facility];  Service: Gynecology;  Laterality: Right;  Excision of right para-tubal mass. Aspiration of Left ovarian cyst, pelvic washings     Family History  Problem Relation Age of Onset  . Heart disease Father   . Hypertension Father   . Cancer Paternal Grandmother        BRAIN  . Cancer Paternal Grandfather        LUNG  . Hypertension Mother     Social history:  reports that she has never smoked. She has never used smokeless tobacco. She reports that she does not drink alcohol or use drugs.   No Known  Allergies  Medications:  Prior to Admission medications   Medication Sig Start Date End Date Taking? Authorizing Provider  hydrochlorothiazide (MICROZIDE) 12.5 MG capsule Take 1 capsule (12.5 mg total) by mouth daily. 12/22/16  Yes Terrance Mass, MD  ketorolac (TORADOL) 10 MG tablet Take 1 tablet (10 mg total) by mouth every 6 (six) hours as needed. 12/22/16  Yes Terrance Mass, MD  norethindrone-ethinyl estradiol (JUNEL FE,GILDESS FE,LOESTRIN FE) 1-20 MG-MCG tablet Take 1 tablet by mouth daily. 12/22/16  Yes Terrance Mass, MD  topiramate (TOPAMAX) 25 MG tablet Take 75 mg by mouth at bedtime.   Yes [provider]    ROS:  Out of a complete 14 system review of symptoms, the patient complains only of the following symptoms, and all other reviewed systems are negative.  Migraine headache  Blood pressure 128/81, pulse 91, height 5\' 3"  (1.6 m), weight 202 lb 8 oz (91.9 kg).  Physical Exam  General: The patient is alert and cooperative at the time of the examination.  Skin: No significant peripheral edema is noted.   Neurologic Exam  Mental status: The patient is alert and oriented x 3 at the time of the examination. The patient has apparent normal recent and remote memory, with an apparently normal attention span and concentration ability.   Cranial nerves: Facial symmetry is present. Speech is normal, no aphasia or dysarthria is noted. Extraocular  movements are full. Visual fields are full.  Motor: The patient has good strength in all 4 extremities.  Sensory examination: Soft touch sensation is symmetric on the face, arms, and legs.  Coordination: The patient has good finger-nose-finger and heel-to-shin bilaterally.  Gait and station: The patient has a normal gait. Tandem gait is normal. Romberg is negative. No drift is seen.  Reflexes: Deep tendon reflexes are symmetric.   Assessment/Plan:  1. Migraine headache  The patient has had an excellent benefit with  the Topamax but she is having side effects with fatigue on the medication, we will cut back to 50 mg at night, a prescription was called in for the 50 mg tablets. She will call if her headaches worsen, he was given a prescription for the Toradol as well. She will follow-up in 6 months.  Jill Alexanders MD 01/27/2017 8:59 AM  [facility] [address] [place], [place] [address]  Phone [phone] Fax [phone]

## 2017-05-11 ENCOUNTER — Ambulatory Visit (HOSPITAL_COMMUNITY)
Admission: EM | Admit: 2017-05-11 | Discharge: 2017-05-11 | Disposition: A | Discharge status: Home / Self Care | Payer: BLUE CROSS/BLUE SHIELD | Attending: Family Medicine | Admitting: Family Medicine

## 2017-05-11 ENCOUNTER — Encounter (HOSPITAL_COMMUNITY): Payer: Self-pay | Admitting: Emergency Medicine

## 2017-05-11 DIAGNOSIS — K602 Anal fissure, unspecified: Secondary | ICD-10-CM | POA: Diagnosis not present

## 2017-05-11 DIAGNOSIS — K649 Unspecified hemorrhoids: Secondary | ICD-10-CM

## 2017-05-11 MED ORDER — HYDROCORTISONE 2.5 % RE CREA: TOPICAL_CREAM | RECTAL | 0 refills | Status: DC

## 2017-05-11 NOTE — ED Triage Notes (Signed)
PT C/O: external hemorrhoids   ONSET: 1 week  SX INCLUDE: pain  DENIES: rectal bleeding  TAKING MEDS: OTC cream w/no relief.   A&O x4... NAD... Ambulatory

## 2017-05-11 NOTE — Discharge Instructions (Signed)
1. Use cream around rectum twice a day.  2. Continue to do "sitz" baths with warm water.  Please return if you have experienced increased bleeding or symptoms fail to improve.

## 2017-05-11 NOTE — ED Provider Notes (Signed)
[facility]    CSN: [id number] Arrival date & time: 05/11/17  1359     History   Chief Complaint Chief Complaint  Patient presents with  . Hemorrhoids    HPI Patient is spanish speaking and conversation was translated by friend in room.  Kristie Hill is a 46 y.o. female coming for evaluation of hemorrhoids. She states she has felt pain with bowel movements for the past month and a half. More recently in the past 2 weeks her pain has increased a lot. She has tried over the counter cream with aloe that did not help much. She has done some warm water sitting that has provided some relief. She reports similar episodes of pain that have happened over the past year that has come and gone, but has never experienced pain to this extent. Her pain is 10/10. Reports bowels are typically soft and only has minimal straining. She reports a little bleeding.    HPI  Past Medical History:  Diagnosis Date  . Blood type, Rh negative    O negative  . Classical migraine with intractable migraine 10/21/2016  . Hormone disorder 2012   hyperprolactinemia  . No pertinent past medical history   . Oligomenorrhea   . Recurrent pregnancy loss     Patient Active Problem List   Diagnosis Date Noted  . Ovarian cyst, left 01/06/2017  . Personal history of ovarian cyst 12/22/2016  . Classical migraine with intractable migraine 10/21/2016  . Bursitis of deltoid 03/16/2016  . Episodic cluster headache, not intractable 02/27/2016  . Weight gain 02/27/2016  . Essential hypertension 02/27/2016  . 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 DILATION AND CURETTAGE OF UTERUS  01/07/07  . DILATION AND CURETTAGE OF UTERUS  10/01/10   D&E  . DILATION AND CURETTAGE OF UTERUS    . LAPAROSCOPY  04/19/2012   Procedure: LAPAROSCOPY OPERATIVE;  Surgeon: Terrance Mass, MD;  Location: [facility];  Service: Gynecology;  Laterality: Right;  Excision of right para-tubal mass. Aspiration of Left ovarian cyst, pelvic washings     OB History    Gravida Para Term Preterm AB Living   5       5 0   SAB TAB Ectopic Multiple Live Births   5               Home Medications    Prior to Admission medications   Medication Sig Start Date End Date Taking? Authorizing Provider  hydrochlorothiazide (MICROZIDE) 12.5 MG capsule Take 1 capsule (12.5 mg total) by mouth daily. 12/22/16  Yes Terrance Mass, MD  norethindrone-ethinyl estradiol (JUNEL FE,GILDESS FE,LOESTRIN FE) 1-20 MG-MCG tablet Take 1 tablet by mouth daily. 12/22/16  Yes Terrance Mass, MD  topiramate (TOPAMAX) 50 MG tablet Take 1 tablet (50 mg total) by mouth at bedtime. 01/27/17  Yes Kathrynn Ducking, MD  hydrocortisone (ANUSOL-HC) 2.5 % rectal cream Apply rectally 2 times daily 05/11/17   Indianna Boran C, PA-C  ketorolac (TORADOL) 10 MG tablet Take 1 tablet (10 mg total) by mouth every 6 (six) hours as needed. 01/27/17   Kathrynn Ducking, MD    Family History Family History  Problem Relation Age of Onset  . Heart disease Father   . Hypertension Father   . Cancer Paternal Grandmother        BRAIN  . Cancer Paternal Grandfather  LUNG  . Hypertension Mother     Social History Social History   Tobacco Use  . Smoking status: Never Smoker  . Smokeless tobacco: Never Used  Substance Use Topics  . Alcohol use: No  . Drug use: No     Allergies   Patient has no known allergies.   Review of Systems Review of Systems  Constitutional: Negative for fatigue and fever.  Gastrointestinal: Positive for anal bleeding and rectal pain. Negative for abdominal pain, constipation and diarrhea.    Physical Exam Triage Vital Signs ED Triage Vitals  Enc Vitals Group     BP 05/11/17 1428 (!) 147/91     Pulse Rate 05/11/17 1428 (!) 101     Resp --      Temp 05/11/17 1428 98.3 F (36.8  C)     Temp Source 05/11/17 1428 Oral     SpO2 05/11/17 1428 99 %     Weight --      Height --      Head Circumference --      Peak Flow --      Pain Score 05/11/17 1430 8     Pain Loc --      Pain Edu? --      Excl. in [place]? --    No data found.  Updated Vital Signs BP (!) 147/91 (BP Location: Left Arm)   Pulse (!) 101   Temp 98.3 F (36.8 C) (Oral)   LMP 02/04/2017   SpO2 99%   Physical Exam  Constitutional: She appears well-developed and well-nourished.  HENT:  Head: Normocephalic and atraumatic.  Abdominal: Soft. She exhibits no distension. There is no tenderness.   Rectal exam: external- no erythema or hemorrhoids, one small skin tag                        Internal- patient very tender to palpation of rectum, especially to the right wall.   UC Treatments / Results  Labs (all labs ordered are listed, but only abnormal results are displayed) Labs Reviewed - No data to display  EKG  EKG Interpretation None       Radiology No results found.  Procedures Procedures (including critical care time)  Medications Ordered in UC Medications - No data to display   Initial Impression / Assessment and Plan / UC Course  I have reviewed the triage vital signs and the nursing notes.  Pertinent labs & imaging results that were available during my care of the patient were reviewed by me and considered in my medical decision making (see chart for details).     Pain and bleeding likely from anal fissure. Anusol cream prescribed, apply twice a day. Advised to continue warm water soaking a couple times a day.   Please return if symptoms fail to resolve or worsen.   Final Clinical Impressions(s) / UC Diagnoses   Final diagnoses:  Hemorrhoids, unspecified hemorrhoid type  Anal fissure    ED Discharge Orders        Ordered    hydrocortisone (ANUSOL-HC) 2.5 % rectal cream     05/11/17 1506       Controlled Substance Prescriptions [place] Controlled Substance Registry  consulted? Not Applicable   Janith Lima, [place] 05/11/17 1540

## 2017-05-17 ENCOUNTER — Ambulatory Visit (INDEPENDENT_AMBULATORY_CARE_PROVIDER_SITE_OTHER): Payer: BLUE CROSS/BLUE SHIELD

## 2017-05-17 ENCOUNTER — Encounter: Payer: Self-pay | Admitting: Obstetrics & Gynecology

## 2017-05-17 ENCOUNTER — Ambulatory Visit: Payer: BLUE CROSS/BLUE SHIELD | Admitting: Obstetrics & Gynecology

## 2017-05-17 VITALS — BP 126/84

## 2017-05-17 DIAGNOSIS — N83201 Unspecified ovarian cyst, right side: Secondary | ICD-10-CM

## 2017-05-17 DIAGNOSIS — R102 Pelvic and perineal pain: Secondary | ICD-10-CM

## 2017-05-17 MED ORDER — IBUPROFEN 800 MG PO TABS: 800.0000 mg | ORAL_TABLET | Freq: Three times a day (TID) | ORAL | 1 refills | Status: DC | PRN

## 2017-05-17 NOTE — Progress Notes (Signed)
    Kristie Hill 1971-06-20 [id number]        46 y.o.  [id number]   RP: Persistent pelvic pain for 1 month  HPI: Patient is doing well on birth control pills norethindrone-ethinylestradiol 1/20 FE.  History of frequent functional ovarian cysts.  Last pelvic ultrasound January 06, 2017 showing a small left ovarian functional cyst.  For the last month has had pelvic pain moving from the left side to the right side.  Currently feeling right pelvic pain which radiates to the right upper quadrant.  No nausea or vomiting.  Normal bowel movements.  No abnormal vaginal discharge.  No urinary tract infection symptoms.  No fever.  Has not taken any pain medication so far.  Past medical history,surgical history, problem list, medications, allergies, family history and social history were all reviewed and documented in the EPIC chart.  Directed ROS with pertinent positives and negatives documented in the history of present illness/assessment and plan.  Exam:  There were no vitals filed for this visit. General appearance:  Normal  Gyn exam:  Vulva normal.  Speculum exam:  Cervix/Vagina normal.  Normal vaginal secretions.  Bimanual exam:  Uterus AV, normal volume, NT.  No Adnexal mass felt.  Left adnexa NT.  Right adnexa mildly tender.  Pelvic US today:  T/V uterus anteverted, generous measuring 8.10 x 4.79 x 3.20 cm.  Endometrial lining normal at 1.6 mm.  Right ovary with a thin-walled cyst measuring 2.0 x 2.4 cm, primarily echo-free with a thin septum measuring 1.1 mm.  Color flow Doppler positive only at the periphery of the cyst.  Left ovary with 2 small calcifications less than 3 mm each.  Negative color flow Doppler.  No free fluid in the posterior cul-de-sac.  U/A negative   Assessment/Plan:  46 y.o. G5P0050   1. Pelvic pain in female Mild right pelvic pain probably associated with the small right functional ovarian cyst.  Patient will use ibuprofen 800 mg per mouth every 8 hours as needed  for the next 48 hours.  Prescription sent to pharmacy.  - US Transvaginal Non-OB; Future - Urinalysis with Culture Reflex  2. Right ovarian cyst Very small right ovarian cyst probably functional measuring 2.4 cm.  Likely to resolve spontaneously.  We will follow-up to reassess only if pelvic pain worsens or does not resolve.  Counseling on above issues more than 50% for 25 minutes.  [name] MD, 3:19 PM 05/17/2017

## 2017-05-17 NOTE — Patient Instructions (Signed)
1. Pelvic pain in female Mild right pelvic pain probably associated with the small right functional ovarian cyst.  Patient will use ibuprofen 800 mg per mouth every 8 hours as needed for the next 48 hours.  Prescription sent to pharmacy.  - US Transvaginal Non-OB; Future - Urinalysis with Culture Reflex  2. Right ovarian cyst Very small right ovarian cyst probably functional measuring 2.4 cm.  Likely to resolve spontaneously.  We will follow-up to reassess only if pelvic pain worsens or does not resolve.  Kalisa, fue un placer encontrarla hoy!

## 2017-05-18 ENCOUNTER — Other Ambulatory Visit: Payer: Self-pay | Admitting: Obstetrics & Gynecology

## 2017-05-18 DIAGNOSIS — R102 Pelvic and perineal pain: Secondary | ICD-10-CM

## 2017-05-18 DIAGNOSIS — N83201 Unspecified ovarian cyst, right side: Secondary | ICD-10-CM

## 2017-05-18 LAB — URINALYSIS W MICROSCOPIC + REFLEX CULTURE
Bilirubin Urine: NEGATIVE
Glucose, UA: NEGATIVE
Hyaline Cast: NONE SEEN /LPF
Ketones, ur: NEGATIVE
Leukocyte Esterase: NEGATIVE
Nitrites, Initial: NEGATIVE
PROTEIN: NEGATIVE
RBC / HPF: NONE SEEN /HPF (ref 0–2)
SPECIFIC GRAVITY, URINE: 1.025 (ref 1.001–1.03)
WBC, UA: NONE SEEN /HPF (ref 0–5)
pH: 6.5 (ref 5.0–8.0)

## 2017-05-18 LAB — NO CULTURE INDICATED

## 2017-06-29 ENCOUNTER — Encounter (HOSPITAL_COMMUNITY): Payer: Self-pay | Admitting: Emergency Medicine

## 2017-06-29 ENCOUNTER — Ambulatory Visit (HOSPITAL_COMMUNITY)
Admission: EM | Admit: 2017-06-29 | Discharge: 2017-06-29 | Disposition: A | Discharge status: Home / Self Care | Payer: BLUE CROSS/BLUE SHIELD | Attending: Internal Medicine | Admitting: Internal Medicine

## 2017-06-29 DIAGNOSIS — K6289 Other specified diseases of anus and rectum: Secondary | ICD-10-CM | POA: Diagnosis not present

## 2017-06-29 MED ORDER — POLYETHYLENE GLYCOL 3350 17 G PO PACK: 17.0000 g | PACK | Freq: Every day | ORAL | 0 refills | Status: DC

## 2017-06-29 MED ORDER — TRAMADOL HCL 50 MG PO TABS: 50.0000 mg | ORAL_TABLET | Freq: Four times a day (QID) | ORAL | 0 refills | Status: DC | PRN

## 2017-06-29 MED ORDER — HYDROCORTISONE 2.5 % RE CREA: TOPICAL_CREAM | RECTAL | 0 refills | Status: DC

## 2017-06-29 NOTE — ED Provider Notes (Signed)
[facility]    CSN: [id number] Arrival date & time: 06/29/17  1650     History   Chief Complaint Chief Complaint  Patient presents with  . Rectal Bleeding    HPI Kristie Hill is a 47 y.o. female presents to the urgent care facility for evaluation of rectal pain and bleeding.  Patient was seen back in November and diagnosed with possible hemorrhoid or fissure.  She was having some pain right around the rectum to touch with intermittent bright red bleeding.  Patient states she used Anusol cream with significant improvement and recently over the last few days she has had increased rectal pain, the same pain she was having back in November.  She currently states Anusol is not helping.  She does admit to being constipated and having hard stools.  She is not taking any medications for constipation.  She denies any abdominal pain, fevers, nausea or vomiting.  HPI  Past Medical History:  Diagnosis Date  . Blood type, Rh negative    O negative  . Classical migraine with intractable migraine 10/21/2016  . Hormone disorder 2012   hyperprolactinemia  . No pertinent past medical history   . Oligomenorrhea   . Recurrent pregnancy loss     Patient Active Problem List   Diagnosis Date Noted  . Ovarian cyst, left 01/06/2017  . Personal history of ovarian cyst 12/22/2016  . Classical migraine with intractable migraine 10/21/2016  . Bursitis of deltoid 03/16/2016  . Episodic cluster headache, not intractable 02/27/2016  . Weight gain 02/27/2016  . Essential hypertension 02/27/2016  . 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 DILATION AND CURETTAGE OF UTERUS  01/07/07  . DILATION AND CURETTAGE OF UTERUS  10/01/10   D&E  . DILATION AND CURETTAGE OF UTERUS    . LAPAROSCOPY  04/19/2012   Procedure: LAPAROSCOPY OPERATIVE;  Surgeon: Terrance Mass, MD;  Location: [facility];  Service: Gynecology;  Laterality: Right;  Excision of right para-tubal mass. Aspiration of Left ovarian cyst, pelvic washings     OB History    Gravida Para Term Preterm AB Living   5       5 0   SAB TAB Ectopic Multiple Live Births   5               Home Medications    Prior to Admission medications   Medication Sig Start Date End Date Taking? Authorizing Provider  hydrochlorothiazide (MICROZIDE) 12.5 MG capsule Take 1 capsule (12.5 mg total) by mouth daily. 12/22/16  Yes Terrance Mass, MD  norethindrone-ethinyl estradiol (JUNEL FE,GILDESS FE,LOESTRIN FE) 1-20 MG-MCG tablet Take 1 tablet by mouth daily. 12/22/16  Yes Terrance Mass, MD  topiramate (TOPAMAX) 50 MG tablet Take 1 tablet (50 mg total) by mouth at bedtime. 01/27/17  Yes Kathrynn Ducking, MD  hydrocortisone (ANUSOL-HC) 2.5 % rectal cream Apply rectally 2 times daily 06/29/17   Duanne Guess, PA-C  ibuprofen (ADVIL,MOTRIN) 800 MG tablet Take 1 tablet (800 mg total) by mouth every 8 (eight) hours as needed. 05/17/17   [name], MD  polyethylene glycol (MIRALAX / GLYCOLAX) packet Take 17 g by mouth daily. 06/29/17   Duanne Guess, PA-C  traMADol (ULTRAM) 50 MG tablet Take 1 tablet (50 mg total) by mouth every 6 (six) hours as needed. 06/29/17   Duanne Guess, PA-C  Family History Family History  Problem Relation Age of Onset  . Heart disease Father   . Hypertension Father   . Cancer Paternal Grandmother        BRAIN  . Cancer Paternal Grandfather        LUNG  . Hypertension Mother     Social History Social History   Tobacco Use  . Smoking status: Never Smoker  . Smokeless tobacco: Never Used  Substance Use Topics  . Alcohol use: No  . Drug use: No     Allergies   Patient has no known allergies.   Review of Systems Review of Systems  Constitutional: Negative for chills and fever.  Respiratory: Negative for shortness of breath.   Cardiovascular:  Negative for chest pain.  Gastrointestinal: Positive for anal bleeding (.  Bright red) and rectal pain. Negative for abdominal pain, blood in stool, nausea and vomiting.  Genitourinary: Negative for difficulty urinating, dysuria and urgency.  Musculoskeletal: Negative for back pain and myalgias.  Skin: Negative for rash.  Neurological: Negative for dizziness and headaches.     Physical Exam Triage Vital Signs ED Triage Vitals  Enc Vitals Group     BP --      Pulse --      Resp --      Temp 06/29/17 1732 99 F (37.2 C)     Temp Source 06/29/17 1732 Oral     SpO2 --      Weight --      Height --      Head Circumference --      Peak Flow --      Pain Score 06/29/17 1731 9     Pain Loc --      Pain Edu? --      Excl. in [place]? --    No data found.  Updated Vital Signs Temp 99 F (37.2 C) (Oral)   LMP 06/28/2017   Visual Acuity Right Eye Distance:   Left Eye Distance:   Bilateral Distance:    Right Eye Near:   Left Eye Near:    Bilateral Near:     Physical Exam  Constitutional: She is oriented to person, place, and time. She appears well-developed and well-nourished. No distress.  HENT:  Head: Normocephalic and atraumatic.  Mouth/Throat: Oropharynx is clear and moist.  Eyes: Conjunctivae are normal. Right eye exhibits no discharge. Left eye exhibits no discharge.  Neck: Normal range of motion.  Cardiovascular: Normal rate.  Pulmonary/Chest: No respiratory distress.  Abdominal: Soft. Bowel sounds are normal. She exhibits no distension and no mass. There is no tenderness. There is no rebound and no guarding.  Genitourinary:  Genitourinary Comments: Examination of the rectal region showed no visible external hemorrhoid.  Left and right buttocks is palpated with no soft tissue mass or palpable abscess present.  There is no tenderness to palpation throughout the left and right gluteal region.  Patient is tender along the anal rim with no sign of fissuring or blood noted.    Musculoskeletal: Normal range of motion. She exhibits no deformity.  Neurological: She is alert and oriented to person, place, and time. She has normal reflexes.  Skin: Skin is warm and dry. No rash noted.  Psychiatric: She has a normal mood and affect. Her behavior is normal. Thought content normal.     UC Treatments / Results  Labs (all labs ordered are listed, but only abnormal results are displayed) Labs Reviewed - No data to display  EKG  EKG Interpretation None       Radiology No results found.  Procedures Procedures (including critical care time)  Medications Ordered in UC Medications - No data to display   Initial Impression / Assessment and Plan / UC Course  I have reviewed the triage vital signs and the nursing notes.  Pertinent labs & imaging results that were available during my care of the patient were reviewed by me and considered in my medical decision making (see chart for details).     47 year old female with normal vital signs and normal exam except for tenderness along the anal rim.  She reports anal pain with bowel movements.  States she has been constipated and is also noted bright red blood.  Patient is given a prescription for MiraLAX, Anusol as well as tramadol as used.  She is educated on signs and symptoms to return to the clinic or ED for.  Final Clinical Impressions(s) / UC Diagnoses   Final diagnoses:  Anal or rectal pain    ED Discharge Orders        Ordered    traMADol (ULTRAM) 50 MG tablet  Every 6 hours PRN     06/29/17 1812    hydrocortisone (ANUSOL-HC) 2.5 % rectal cream     06/29/17 1812    polyethylene glycol (MIRALAX / GLYCOLAX) packet  Daily     06/29/17 1812        Duanne Guess, PA-C 06/29/17 1837

## 2017-06-29 NOTE — ED Triage Notes (Signed)
PT was seen here 11/20 for rectal bleeding and hemorrhoids. PT was given hydrocortisone cream which helped for 2 weeks. PT reports pain has increased over the last few weeks. PT was seen at a clinic last week for rectal pain. PT reports she has not been able to sleep for a week due to rectal pain.   PT was given a cream and suppository last week and she is having burning in rectum, these meds made the pain worse. (hydrocortisone 2.5% cream and preparation H suppositories)  PT noticed blood on toilet paper once last week.

## 2017-07-06 ENCOUNTER — Ambulatory Visit (INDEPENDENT_AMBULATORY_CARE_PROVIDER_SITE_OTHER): Payer: BLUE CROSS/BLUE SHIELD | Admitting: Obstetrics & Gynecology

## 2017-07-06 ENCOUNTER — Encounter: Payer: Self-pay | Admitting: Obstetrics & Gynecology

## 2017-07-06 VITALS — BP 124/86 | Ht 64.0 in | Wt 187.0 lb

## 2017-07-06 DIAGNOSIS — Z3041 Encounter for surveillance of contraceptive pills: Secondary | ICD-10-CM

## 2017-07-06 DIAGNOSIS — K625 Hemorrhage of anus and rectum: Secondary | ICD-10-CM | POA: Diagnosis not present

## 2017-07-06 DIAGNOSIS — Z01419 Encounter for gynecological examination (general) (routine) without abnormal findings: Secondary | ICD-10-CM

## 2017-07-06 MED ORDER — LIDOCAINE-HYDROCORTISONE ACE 3-1 % RE KIT: 1.0000 "application " | PACK | Freq: Two times a day (BID) | RECTAL | 2 refills | Status: AC

## 2017-07-06 MED ORDER — NORETHIN ACE-ETH ESTRAD-FE 1-20 MG-MCG PO TABS: 1.0000 | ORAL_TABLET | Freq: Every day | ORAL | 4 refills | Status: DC

## 2017-07-06 NOTE — Progress Notes (Signed)
Kristie Hill April 20, 1971 [id number]   History:    47 y.o. [id number] Married  RP:  Established patient presenting for annual gyn exam   HPI:  Well on generic of Junel Fe 1/20.  No BTB.  No pelvic pain anymore.  C/O constipation with severe rectal pain.  Fissure per patient with occasional mild rectal bleeding.  Using Anusol-HC x 1 week without much help.  Taking Miralax for constipation, but not working very well.  Urine normal.  Breasts wnl.    Past medical history,surgical history, family history and social history were all reviewed and documented in the EPIC chart.  Gynecologic History Patient's last menstrual period was 06/28/2017. Contraception: OCP (estrogen/progesterone) Last Pap: 2016. Results were: normal Last mammogram: 2017. Results were: normal  Obstetric History OB History  Gravida Para Term Preterm AB Living  5       5 0  SAB TAB Ectopic Multiple Live Births  5            # Outcome Date GA Lbr Len/2nd Weight Sex Delivery Anes PTL Lv  5 SAB           4 SAB           3 SAB           2 SAB           1 SAB                ROS: A ROS was performed and pertinent positives and negatives are included in the history.  GENERAL: No fevers or chills. HEENT: No change in vision, no earache, sore throat or sinus congestion. NECK: No pain or stiffness. CARDIOVASCULAR: No chest pain or pressure. No palpitations. PULMONARY: No shortness of breath, cough or wheeze. GASTROINTESTINAL: No abdominal pain, nausea, vomiting or diarrhea, melena or bright red blood per rectum. GENITOURINARY: No urinary frequency, urgency, hesitancy or dysuria. MUSCULOSKELETAL: No joint or muscle pain, no back pain, no recent trauma. DERMATOLOGIC: No rash, no itching, no lesions. ENDOCRINE: No polyuria, polydipsia, no heat or cold intolerance. No recent change in weight. HEMATOLOGICAL: No anemia or easy bruising or bleeding. NEUROLOGIC: No headache, seizures, numbness, tingling or weakness. PSYCHIATRIC:  No depression, no loss of interest in normal activity or change in sleep pattern.     Exam:   BP 124/86   Ht '5\' 4"'  (1.626 m)   Wt 187 lb (84.8 kg)   LMP 06/28/2017   BMI 32.10 kg/m   Body mass index is 32.1 kg/m.  General appearance : Well developed well nourished female. No acute distress HEENT: Eyes: no retinal hemorrhage or exudates,  Neck supple, trachea midline, no carotid bruits, no thyroidmegaly Lungs: Clear to auscultation, no rhonchi or wheezes, or rib retractions  Heart: Regular rate and rhythm, no murmurs or gallops Breast:Examined in sitting and supine position were symmetrical in appearance, no palpable masses or tenderness,  no skin retraction, no nipple inversion, no nipple discharge, no skin discoloration, no axillary or supraclavicular lymphadenopathy Abdomen: no palpable masses or tenderness, no rebound or guarding Extremities: no edema or skin discoloration or tenderness  Pelvic: Vulva normal  Bartholin, Urethra, Skene Glands: Within normal limits             Vagina: No gross lesions or discharge  Cervix: No gross lesions or discharge  Uterus  AV, normal size, shape and consistency, non-tender and mobile  Adnexa  Without masses or tenderness  Anus:  Small non-thrombosed hemorrhoid.  Inflammation present.  No fissure seen.    Assessment/Plan:  47 y.o. female for annual exam   1. Encounter for routine gynecological examination with Papanicolaou smear of cervix Normal gynecologic exam.  Pap reflex done.  Breast exam normal.  Schedule screening mammogram.  Will follow up for fasting health labs here. - CBC; Future - Comp Met (CMET); Future - Lipid panel; Future - TSH; Future - Vitamin D 1,25 dihydroxy; Future  2. Encounter for surveillance of contraceptive pills Well on the generic of Junel FE 1/20.  No contraindication.  Birth control pill represcribed.  3. Rectal bleeding Difficult to treat severe constipation associated with anal pain and mild rectal  bleeding.  Not helped on MiraLAX and Anusol HC.  Counseling done on foods that can help constipation.  Recommended high-fiber diet with a lot of water intake.  Colace recommended.  AnaMantle HC generic prescribed.  Refer to gastroenterology for assessment and management.  Counseling on above issues more than 50% for 10 minutes.  [name] MD, 4:06 PM 07/06/2017

## 2017-07-06 NOTE — Patient Instructions (Signed)
1. Encounter for routine gynecological examination with Papanicolaou smear of cervix Normal gynecologic exam.  Pap reflex done.  Breast exam normal.  Schedule screening mammogram.  Will follow up for fasting health labs here. - CBC; Future - Comp Met (CMET); Future - Lipid panel; Future - TSH; Future - Vitamin D 1,25 dihydroxy; Future  2. Encounter for surveillance of contraceptive pills Well on the generic of Junel FE 1/20.  No contraindication.  Birth control pill represcribed.  3. Rectal bleeding Difficult to treat severe constipation associated with anal pain and mild rectal bleeding.  Not helped on MiraLAX and Anusol HC.  Counseling done on foods that can help constipation.  Recommended high-fiber diet with a lot of water intake.  Colace recommended.  AnaMantle HC generic prescribed.  Refer to gastroenterology for assessment and management.  Louann, fue un placer verle!  Voy a informarle de sus [employer].   Estreimiento, en adultos Constipation, Adult Estreimiento significa que una persona defeca en una semana menos que lo normal, tiene dificultad para defecar, o las heces son secas, duras, o ms grandes que lo normal. El estreimiento podra estar provocado por una enfermedad preexistente. Puede empeorar con la edad si una persona toma ciertos medicamentos y no toma suficiente lquido. Siga estas indicaciones en su casa: Qu debe comer y beber   Consuma alimentos con alto contenido de [place], como frutas y verduras frescas, cereales integrales y frijoles.  Limite los alimentos ricos en grasas y con bajo contenido de fibra, o muy procesados, como las papas fritas, [place], [place], dulces y refrescos.  Beba suficiente lquido para Consulting civil engineer orina clara o de color amarillo plido. Instrucciones generales  Haga actividad fsica habitualmente o como se lo haya indicado el mdico.  Vaya al bao cuando sienta la necesidad de ir. No se aguante las ganas.  Tome  los medicamentos de venta libre y los recetados solamente como se lo haya indicado el mdico. Estos incluyen los suplementos de [place].  Practique ejercicios de rehabilitacin del suelo plvico, como la respiracin profunda mientras relaja la parte inferior del abdomen y la relajacin del suelo plvico mientras defeca.  Controle su afeccin para Actuary cambio.  Concurra a todas las visitas de seguimiento como se lo haya indicado el mdico. Esto es importante. Comunquese con un mdico si:  Su dolor empeora.  Tiene fiebre.  No defeca despus de 4das.  Vomita.  No tiene hambre.  Pierde peso.  Tiene una hemorragia en el ano.  Las heces son delgadas como un lpiz. Solicite ayuda de inmediato si:  Tiene fiebre y los sntomas empeoran repentinamente.  Observa que se filtran heces o hay sangre en las heces.  Tiene el abdomen distendido.  Siente un dolor intenso en el abdomen.  Se siente mareado o se desmaya. Esta informacin no tiene Marine scientist el consejo del mdico. Asegrese de hacerle al mdico cualquier pregunta que tenga. Document Released: 06/28/2007 Document Revised: 09/28/2016 Document Reviewed: 11/27/2015 Elsevier Interactive Patient Education  2018 [place] (Health Maintenance, Female) Un estilo de vida saludable y los cuidados preventivos pueden favorecer considerablemente a la salud y Musician. Pregunte a su mdico cul es el cronograma de exmenes peridicos apropiado para usted. Esta es una buena oportunidad para consultarlo sobre cmo prevenir enfermedades y [place] sano. Adems de los controles, hay muchas otras cosas que puede hacer usted mismo. Los expertos han realizado numerosas investigaciones [employer]itor cambios en el estilo de vida y las  medidas de prevencin que, muy probablemente, lo ayudarn a mantenerse sano. Solicite a su mdico ms informacin. EL PESO Y LA DIETA Consuma una dieta  saludable.  Asegrese de [employer] verduras, frutas, productos lcteos de bajo contenido de [place] y Advertising account planner.  No consuma muchos alimentos de alto contenido de grasas slidas, azcares agregados o sal.  Realice actividad fsica con regularidad. Esta es una de las prcticas ms importantes que puede hacer por su salud. ? La Delorise Shiner de los adultos deben hacer ejercicio durante al menos 131mnutos por semana. El ejercicio debe aumentar la frecuencia cardaca y pActorla transpiracin (ejercicio de i[place]. ? La mayora de los adultos tambin deben hacer ejercicios de elongacin al mToysRusveces a la semana. Agregue esto al su plan de ejercicio de intensidad moderada. Mantenga un peso saludable.  El ndice de masa corporal ([facility] es una medida que puede utilizarse para identificar posibles problemas de pB[place] Proporciona una estimacin de la grasa corporal basndose en el peso y la altura. Su mdico puede ayudarle a dRadiation protection practitionerIYaurely a lScientist, forensico mTheatre managerun peso saludable.  Para las mujeres de 20aos o ms: ? Un ICross Road Medical Centermenor de 18,5 se considera bajo peso. ? Un ICambridge Health Alliance - Somerville Campusentre 18,5 y 24,9 es normal. ? Un IV[facility] City[facility] 25 y 29,9 se considera sobrepeso. ? Un IMC de 30 o ms se considera obesidad. Observe los niveles de colesterol y lpidos en la sangre.  Debe comenzar a rEnglish as a second language teacherde lpidos y cResearch officer, trade unionen la sangre a los 20aos y luego repetirlos cada 588aos  Es posible que nAutomotive engineerlos niveles de colesterol con mayor frecuencia si: ? Sus niveles de lpidos y colesterol son altos. ? Es mayor de [id number] ? Presenta un alto riesgo de padecer enfermedades cardacas. DETECCIN DE CNCER Cncer de pulmn  Se recomienda realizar exmenes de deteccin de cncer de pulmn a personas adultas entre 566y 859aos que estn en riesgo de dHorticulturist, commercialde pulmn por sus antecedentes de consumo de tabaco.  Se recomienda una tomografa computarizada de baja dosis  de los pulmones todos los aos a las personas que: ? Fuman actualmente. ? Hayan dejado el hbito en algn momento en los ltimos 15aos. ? Hayan fumado durante 30aos un paquete diario. Un paquete-ao equivale a fumar un promedio de un paquete de cigarrillos diario durante un ao.  Los exmenes de deteccin anuales deben continuar hasta que hayan pasado 15aos desde que dej de fumar.  Ya no debern realizarse si tiene un problema de salud que le impida recibir tratamiento para eScience writerde pulmn. Cncer de mama  Practique la autoconciencia de la mama. Esto significa reconocer la apariencia normal de sus mamas y cmo las siente.  Tambin significa realizar autoexmenes regulares de lJohnson & Johnson Informe a su mdico sobre cualquier cambio, sin importar cun pequeo sea.  Si tiene entre 20 y 329aos, un mdico debe realizarle un examen clnico de las mamas como parte del examen regular de s[place] cada 1 a 3aos.  Si tiene 40aos o ms, debe rInformation systems managerclnico de las mMicrosoft Tambin considere realizarse una r[place](m[place] todos los aLiberty Center  Si tiene antecedentes familiares de cncer de mama, hable con su mdico para someterse a un estudio gentico.  Si tiene alto riesgo de pChief Financial Officerde mama, hable con su mdico para someterse a uPublic house managery u3M Company  La evaluacin del gen del cncer de  mama (BRCA) se recomienda a mujeres que tengan familiares con cnceres relacionados con el BRCA. Los cnceres relacionados con el BRCA incluyen los siguientes: ? [place]. ? Ovario. ? Trompas. ? Cnceres de peritoneo.  Los resultados de la evaluacin determinarn la necesidad de asesoramiento gentico y de [place] de BRCA1 y BRCA2. Cncer de cuello del tero El mdico puede recomendarle que se haga pruebas peridicas de deteccin de cncer de los rganos de la pelvis (ovarios, tero y vagina). Estas pruebas incluyen un  examen plvico, que abarca controlar si se produjeron cambios microscpicos en la superficie del cuello del tero (prueba de Papanicolaou). Pueden recomendarle que se haga estas pruebas cada 3aos, a partir de los 21aos.  A las mujeres que tienen entre 30 y 35aos, los mdicos pueden recomendarles que se sometan a exmenes plvicos y pruebas de Papanicolaou cada 57aos, o a la prueba de Papanicolaou y el examen plvico en combinacin con estudios de deteccin del virus del papiloma humano (VPH) cada 5aos. Algunos tipos de VPH aumentan el riesgo de Chief Financial Officer de cuello del tero. La prueba para la deteccin del VPH tambin puede realizarse a mujeres de cualquier edad cuyos resultados de la prueba de Papanicolaou no sean claros.  Es posible que otros mdicos no recomienden exmenes de deteccin a mujeres no embarazadas que se consideran sujetos de bajo riesgo de Chief Financial Officer de pelvis y que no tienen sntomas. Pregntele al mdico si un examen plvico de deteccin es adecuado para usted.  Si ha recibido un tratamiento para Science writer cervical o una enfermedad que podra causar cncer, necesitar realizarse una prueba de Papanicolaou y controles durante al menos 5 aos de concluido el [place]. Si no se ha hecho el Papanicolaou con regularidad, debern volver a evaluarse los factores de riesgo (como tener un nuevo compaero sexual), para Teacher, adult education si debe realizarse los estudios nuevamente. Algunas mujeres sufren problemas mdicos que aumentan la probabilidad de Museum/gallery curator cncer de cuello del tero. En estos casos, el mdico podr QUALCOMM se realicen controles y pruebas de Papanicolaou con ms frecuencia. Cncer colorrectal  Este tipo de cncer puede detectarse y a menudo prevenirse.  Por lo general, los estudios de rutina se deben Medical laboratory scientific officer a Field seismologist a Proofreader de los 27 aos y [place] 10 aos.  Sin embargo, el mdico podr aconsejarle que lo haga antes, si tiene factores de riesgo para el  cncer de colon.  Tambin puede recomendarle que use un kit de prueba para Hydrologist en la materia fecal.  Es posible que se use una pequea cmara en el extremo de un tubo para examinar directamente el colon (sigmoidoscopia o colonoscopia) a fin de Hydrographic surveyor formas tempranas de cncer colorrectal.  Los exmenes de rutina generalmente comienzan a los 79aos.  El examen directo del colon se debe repetir cada 5 a 10aos hasta los 75aos. Sin embargo, es posible que se realicen exmenes con mayor frecuencia, si se detectan formas tempranas de plipos precancerosos o pequeos bultos. Cncer de piel  Revise la piel de la cabeza a los pies con regularidad.  Informe a su mdico si aparecen nuevos lunares o los que tiene se modifican, especialmente en su forma y color.  Tambin notifique al mdico si tiene un lunar que es ms grande que el tamao de una goma de lpiz.  Siempre use pantalla solar. Aplique pantalla solar de Kerry Dory y repetida a lo largo del Training and development officer.  Protjase usando mangas y [employer], un sombrero de ala ancha y gafas para  el sol, siempre que se encuentre en el exterior. ENFERMEDADES CARDACAS, DIABETES E HIPERTENSIN ARTERIAL  La hipertensin arterial causa enfermedades cardacas y [place] el riesgo de ictus. La hipertensin arterial es ms probable en los siguientes casos: ? Las personas que tienen la presin arterial en el extremo del rango normal (100-139/85-89 mm Hg). ? [employer] Corporation con sobrepeso u obesidad. ? Scientist, water quality.  Si usted tiene entre 18 y 39 aos, debe medirse la presin arterial cada 3 a 5 aos. Si usted tiene 40 aos o ms, debe medirse la presin arterial [employer]. Debe medirse la presin arterial dos veces: una vez cuando est en un hospital o una clnica y la otra vez cuando est en otro sitio. Registre el promedio de [employer]. Para controlar su presin arterial cuando no est en un hospital o [place],  puede usar lo siguiente: ? Jorje Guild automtica para medir la presin arterial en una farmacia. ? Un monitor para medir la presin arterial en el hogar.  Si tiene entre 24 y 30 aos, consulte a su mdico si debe tomar aspirina para prevenir el ictus.  Realcese exmenes de deteccin de la diabetes con regularidad. Esto incluye la toma de [place] de sangre para controlar el nivel de azcar en la sangre durante el [place]. ? Si tiene un peso normal y un bajo riesgo de padecer diabetes, realcese este anlisis cada tres aos despus de los 45aos. ? Si tiene sobrepeso y un alto riesgo de padecer diabetes, considere someterse a este anlisis antes o con mayor frecuencia. PREVENCIN DE INFECCIONES HepatitisB  Si tiene un riesgo ms alto de Museum/gallery curator hepatitis B, debe someterse a un examen de deteccin de este virus. Se considera que tiene un alto riesgo de contraer hepatitis B si: ? Naci en un pas donde la hepatitis B es frecuente. Pregntele a su mdico qu pases son considerados de Public affairs consultant. ? Sus padres nacieron en un pas de alto riesgo y usted no recibi una vacuna que lo proteja contra la hepatitis B (vacuna contra la hepatitis B). ? [place]. ? [place] agujas para inyectarse drogas. ? Vive con alguien que tiene hepatitis B. ? Ha tenido sexo con alguien que tiene hepatitis B. ? Recibe tratamiento de hemodilisis. ? Toma ciertos medicamentos para el cncer, trasplante de rganos y afecciones autoinmunitarias. Hepatitis C  Se recomienda un anlisis de [place] para: ? [employer] 1945 y 1965. ? Todas las personas que tengan un riesgo de haber contrado hepatitis C. Enfermedades de transmisin sexual (ETS).  Debe realizarse pruebas de deteccin de enfermedades de transmisin sexual (ETS), incluidas gonorrea y clamidia si: ? Es sexualmente activo y es menor de 78MVE. ? Es mayor de 24aos, y Investment banker, operational informa que corre riesgo de tener este tipo de  infecciones. ? La actividad sexual ha cambiado desde que le hicieron la ltima prueba de deteccin y tiene un riesgo mayor de Best boy clamidia o Radio broadcast assistant. Pregntele al mdico si usted tiene riesgo.  Si no tiene el VIH, pero corre riesgo de infectarse por el virus, se recomienda tomar diariamente un medicamento recetado para evitar la infeccin. Esto se conoce como profilaxis previa a la exposicin. Se considera que est en riesgo si: ? Es [place] sexualmente y no [place] preservativos habitualmente o no conoce el estado del VIH de sus Advertising copywriter. ? Se inyecta drogas. ? Es [place] sexualmente con Ardelia Mems pareja que tiene VIH. Consulte a su mdico para saber  si tiene un alto riesgo de infectarse por el VIH. Si opta por comenzar la profilaxis previa a la exposicin, primero debe realizarse anlisis de deteccin del VIH. Luego, le harn anlisis cada 49mses mientras est tomando los medicamentos para la profilaxis previa a la exposicin. [facility] Institute Si es premenopusica y puede quedar e[place] solicite a su mdico asesoramiento previo a la concepcin.  Si puede quedar embarazada, tome 400 a [id number](mcg) de cido fAnheuser-Busch  Si desea evitar el embarazo, hable con su mdico sobre el control de la natalidad (anticoncepcin). OSTEOPOROSIS Y MENOPAUSIA  La osteoporosis es una enfermedad en la que los huesos pierden los minerales y la fuerza por el avance de la edad. El resultado pueden ser fracturas graves en los hBronxville El riesgo de osteoporosis puede identificarse con uArdelia Memsprueba de densidad sea.  Si tiene 65aos o ms, o si est en riesgo de sufrir osteoporosis y fracturas, pregunte a su mdico si debe someterse a exmenes.  Consulte a su mdico si debe tomar un suplemento de calcio o de vitamina D para reducir el riesgo de osteoporosis.  La menopausia puede presentar ciertos sntomas fsicos y rGaffer  La terapia de reemplazo hormonal puede reducir algunos de estos sntomas y  rGaffer Consulte a su mdico para saber si la terapia de reemplazo hormonal es conveniente para usted. INSTRUCCIONES PARA EL CUIDADO EN EL HOGAR  Realcese los estudios de rutina de la salud, dentales y de lPublic librarian  MJeffersonville  No consuma ningn producto que contenga tabaco, lo que incluye cigarrillos, tabaco de mHigher education careers advisero cPsychologist, sport and exercise  Si est embarazada, no beba alcohol.  Si est amamantando, reduzca el consumo de alcohol y la frecuencia con la que consume.  Si es mujer y no est embarazada limite el consumo de alcohol a no ms de 1 medida por da. Una medida equivale a 12onzas de cerveza, 5onzas de vino o 1onzas de bebidas alcohlicas de alta graduacin.  No consuma drogas.  No comparta agujas.  Solicite ayuda a su mdico si necesita apoyo o informacin para abandonar las drogas.  Informe a su mdico si a menudo se siente deprimido.  Notifique a su mdico si alguna vez ha sido vctima de abuso o si no se siente seguro en su hogar. Esta informacin no tiene cMarine scientistel consejo del mdico. Asegrese de hacerle al mdico cualquier pregunta que tenga. Document Released: 05/28/2011 Document Revised: 06/29/2014 Document Reviewed: 03/12/2015 Elsevier Interactive Patient Education  2[employer]

## 2017-07-06 NOTE — Addendum Note (Signed)
Addended by: Thurnell Garbe A on: 07/06/2017 04:51 PM   Modules accepted: Orders

## 2017-07-07 ENCOUNTER — Encounter: Payer: Self-pay | Admitting: Nurse Practitioner

## 2017-07-07 ENCOUNTER — Telehealth: Payer: Self-pay | Admitting: *Deleted

## 2017-07-07 ENCOUNTER — Other Ambulatory Visit: Payer: BLUE CROSS/BLUE SHIELD

## 2017-07-07 DIAGNOSIS — K644 Residual hemorrhoidal skin tags: Secondary | ICD-10-CM

## 2017-07-07 DIAGNOSIS — K6289 Other specified diseases of anus and rectum: Secondary | ICD-10-CM

## 2017-07-07 DIAGNOSIS — K625 Hemorrhage of anus and rectum: Secondary | ICD-10-CM

## 2017-07-07 DIAGNOSIS — Z01419 Encounter for gynecological examination (general) (routine) without abnormal findings: Secondary | ICD-10-CM

## 2017-07-07 NOTE — Telephone Encounter (Signed)
Referral placed at [facility] they will call pt to schedule.

## 2017-07-07 NOTE — Telephone Encounter (Signed)
-----   Message from [name], MD sent at 07/06/2017  4:40 PM EST ----- Regarding: Refer to Gastro-Enterology Mild rectal bleeding and severe anal pain with difficult to treat constipation.  Small non-thrombosed external hemorrhoid, inflammation.

## 2017-07-08 ENCOUNTER — Telehealth: Payer: Self-pay | Admitting: *Deleted

## 2017-07-08 ENCOUNTER — Telehealth: Payer: Self-pay | Admitting: Anesthesiology

## 2017-07-08 LAB — PAP IG W/ RFLX HPV ASCU

## 2017-07-08 NOTE — Telephone Encounter (Signed)
Prior authorization for lidocaine-hydrocortisone 3-1% kit done via cover my meds website, will wait for response.

## 2017-07-09 ENCOUNTER — Other Ambulatory Visit: Payer: Self-pay | Admitting: Anesthesiology

## 2017-07-09 NOTE — Telephone Encounter (Signed)
Pt scheduled on 07/21/17

## 2017-07-09 NOTE — Telephone Encounter (Signed)
Can prescribe Anusol-HC generic.

## 2017-07-10 LAB — VITAMIN D 1,25 DIHYDROXY
VITAMIN D 1, 25 (OH) TOTAL: 62 pg/mL (ref 18–72)
Vitamin D2 1, 25 (OH)2: <8 pg/mL
Vitamin D3 1, 25 (OH)2: 62 pg/mL

## 2017-07-10 LAB — CBC
HEMATOCRIT: 37.5 % (ref 35.0–45.0)
Hemoglobin: 12.6 g/dL (ref 11.7–15.5)
MCH: 30.9 pg (ref 27.0–33.0)
MCHC: 33.6 g/dL (ref 32.0–36.0)
MCV: 91.9 fL (ref 80.0–100.0)
MPV: 11.2 fL (ref 7.5–12.5)
PLATELETS: 270 10*3/uL (ref 140–400)
RBC: 4.08 10*6/uL (ref 3.80–5.10)
RDW: 12.9 % (ref 11.0–15.0)
WBC: 8.5 10*3/uL (ref 3.8–10.8)

## 2017-07-10 LAB — LIPID PANEL
CHOLESTEROL: 153 mg/dL (ref <200)
HDL: 49 mg/dL — AB (ref >50)
LDL CHOLESTEROL (CALC): 81 mg/dL
Non-HDL Cholesterol (Calc): 104 mg/dL (calc) (ref <130)
TRIGLYCERIDES: 126 mg/dL (ref <150)
Total CHOL/HDL Ratio: 3.1 (calc) (ref <5.0)

## 2017-07-10 LAB — COMPREHENSIVE METABOLIC PANEL
AG RATIO: 1.5 (calc) (ref 1.0–2.5)
ALKALINE PHOSPHATASE (APISO): 51 U/L (ref 33–115)
ALT: 22 U/L (ref 6–29)
AST: 18 U/L (ref 10–35)
Albumin: 4.2 g/dL (ref 3.6–5.1)
BILIRUBIN TOTAL: 0.4 mg/dL (ref 0.2–1.2)
BUN: 11 mg/dL (ref 7–25)
CO2: 23 mmol/L (ref 20–32)
Calcium: 9.5 mg/dL (ref 8.6–10.2)
Chloride: 108 mmol/L (ref 98–110)
Creat: 0.73 mg/dL (ref 0.50–1.10)
GLUCOSE: 90 mg/dL (ref 65–99)
Globulin: 2.8 g/dL (calc) (ref 1.9–3.7)
Potassium: 4.1 mmol/L (ref 3.5–5.3)
Sodium: 140 mmol/L (ref 135–146)
Total Protein: 7 g/dL (ref 6.1–8.1)

## 2017-07-10 LAB — TSH: TSH: 0.89 mIU/L

## 2017-07-21 ENCOUNTER — Encounter: Payer: Self-pay | Admitting: Internal Medicine

## 2017-07-21 ENCOUNTER — Ambulatory Visit: Payer: BLUE CROSS/BLUE SHIELD | Admitting: Nurse Practitioner

## 2017-07-21 ENCOUNTER — Ambulatory Visit: Payer: BLUE CROSS/BLUE SHIELD | Admitting: Internal Medicine

## 2017-07-21 VITALS — BP 128/86 | HR 88 | Ht 64.5 in | Wt 189.0 lb

## 2017-07-21 DIAGNOSIS — K5904 Chronic idiopathic constipation: Secondary | ICD-10-CM

## 2017-07-21 DIAGNOSIS — K602 Anal fissure, unspecified: Secondary | ICD-10-CM

## 2017-07-21 DIAGNOSIS — D1803 Hemangioma of intra-abdominal structures: Secondary | ICD-10-CM

## 2017-07-21 NOTE — Telephone Encounter (Signed)
Medication denied, by insurance. Kristie Hill spoke with patient about this on 07/09/17

## 2017-07-21 NOTE — Patient Instructions (Signed)
    Keep doing what you are with the diet and water.  If the rectal pain and bleeding are a problem again come back to see me and I can prescribe medication.  I appreciate the opportunity to care for you. Gatha Mayer, MD, [facility]

## 2017-07-21 NOTE — Progress Notes (Signed)
Kristie Hill 47 y.o. [birth year] [id number] Referred by: Dr. Chalmers Cater  Assessment & Plan:   Encounter Diagnoses  Name Primary?  Marland Kitchen Anal fissure Yes  . Chronic idiopathic constipation   . Hepatic hemangioma    I think the signs and symptoms and the overall history is very consistent with an anal fissure aggravated by constipation.  She said 3 weeks or more without significant symptoms, that was the last time she had bleeding and he feels well now moving her bowels better on the fiber in her diet.  Given that it was just blood on the tissue paper and it is responded to conservative treatment I am not inclined to perform a colonoscopy at this time.  She agrees with this plan.  She knows to contact me again if she has further bleeding and if the bleeding suggest that it is from more proximal with blood in the stool or passing and into the toilet etc. she knows to contact me.  I reviewed and explained that she had a tiny hepatic hemangioma that is innocent and does not need further follow-up as best we can tell.  I appreciate the opportunity to care for this patient. CC: Kristie Bruins, MD     Subjective:   Chief Complaint: anal pain and bleeding  HPI History taken with the use of an interpreter  the patient is a very nice 47 year old [place] woman With a several month history of anal pain and blood on the tissue paper.  She was seen in gynecology and referred for evaluation of this.  She has been chronically constipated, she recalls having a large painful bowel movement in November and then the symptoms of painful defecation and pain afterwards and bright red blood on the tissue paper occurring.  She has since significantly improved since adding fiber to her diet and drinking more fluids.  She was prescribed lidocaine hydrocortisone cream but this never got approved so she never used it.  Again she does not seem to have symptoms now the last rectal bleeding she had was a  slight bit of bright red blood on the tissue paper 3 weeks ago.  She also relates having right upper quadrant pain in the fall and had an ultrasound which I reviewed the report, no gallstones she had a 1 cm lesion thought to be a hepatic hemangioma.  She denies any further abdominal pain.  It seems like that has improved as she has improved her constipation. No Known Allergies Current Meds  Medication Sig  . hydrochlorothiazide (MICROZIDE) 12.5 MG capsule Take 1 capsule (12.5 mg total) by mouth daily.  . hydrocortisone (ANUSOL-HC) 2.5 % rectal cream Apply rectally 2 times daily  . norethindrone-ethinyl estradiol (JUNEL FE,GILDESS FE,LOESTRIN FE) 1-20 MG-MCG tablet Take 1 tablet by mouth daily.  Marland Kitchen topiramate (TOPAMAX) 50 MG tablet Take 1 tablet (50 mg total) by mouth at bedtime.   Past Medical History:  Diagnosis Date  . Anal fissure   . Blood type, Rh negative    O negative  . Classical migraine with intractable migraine 10/21/2016  . Hemorrhoids   . Hormone disorder 2012   hyperprolactinemia  . Hypertension   . Oligomenorrhea   . Recurrent pregnancy loss    Past Surgical History:  Procedure Laterality Date  . DILATION AND CURETTAGE OF UTERUS  01/07/07  . DILATION AND CURETTAGE OF UTERUS  10/01/10   D&E  . DILATION AND CURETTAGE OF UTERUS    . LAPAROSCOPY  04/19/2012   Procedure: LAPAROSCOPY  OPERATIVE;  Surgeon: Terrance Mass, MD;  Location: [facility];  Service: Gynecology;  Laterality: Right;  Excision of right para-tubal mass. Aspiration of Left ovarian cyst, pelvic washings    Social History   Social History Narrative   Emigrated from [place] 2009 approximately   Lives with father and husband   Caffeine use: Tea sometimes   Right-handed    She is a housekeeper with [employer]   No children   family history includes Brain cancer in her paternal grandmother; Heart disease in her father; Hypertension in her father and mother; Lung cancer in her paternal  grandfather.   Review of Systems As per HPI.  The remainder the review of systems is negative.  Objective:   Physical Exam @BP  128/86 (BP Location: Left Arm, Patient Position: Sitting, Cuff Size: Normal)   Pulse 88   Ht 5' 4.5" (1.638 m) Comment: height measured without shoes  Wt 189 lb (85.7 kg)   LMP 06/28/2017   BMI 31.94 kg/m @  [employer] PA-S present for exam  General:  NAD, obese pleasant Caucasian Hispanic woman Eyes:   anicteric Lungs:  clear Heart::  S1S2 no rubs, murmurs or gallops Abdomen:  soft and nontender, BS+ Ext:   no edema, cyanosis or clubbing  Rectal exam reveals a tiny anterior sentinel pile, the anoderm is otherwise normal.  Digital exam shows that the anal canal is mildly indurated but not significantly tender, mildly so.  There is no rectal mass and there is brown stool.  There is no rectocele.   Anoscopy is performed demonstrating very small external hemorrhoid slightly inflamed I do not see any fissure.    Data Reviewed:  GYN notes from 2019 Normal CBC July 12, 2017 Urgent care notes from fall 2018 with initial diagnosis of anal fissure

## 2017-08-02 ENCOUNTER — Ambulatory Visit: Payer: BLUE CROSS/BLUE SHIELD | Admitting: Adult Health

## 2017-11-02 ENCOUNTER — Ambulatory Visit: Payer: BLUE CROSS/BLUE SHIELD | Admitting: Adult Health

## 2017-11-02 ENCOUNTER — Encounter: Payer: Self-pay | Admitting: Adult Health

## 2017-11-02 VITALS — BP 128/84 | HR 72 | Ht 63.0 in | Wt 197.0 lb

## 2017-11-02 DIAGNOSIS — G43009 Migraine without aura, not intractable, without status migrainosus: Secondary | ICD-10-CM

## 2017-11-02 MED ORDER — TOPIRAMATE 50 MG PO TABS: 50.0000 mg | ORAL_TABLET | Freq: Every day | ORAL | 3 refills | Status: DC

## 2017-11-02 NOTE — Progress Notes (Signed)
PATIENT: Kristie Hill DOB: 1971-02-27  REASON FOR VISIT: follow up HISTORY FROM: patient - interpreter present . HISTORY OF PRESENT ILLNESS: Today 11/02/17 Kristie Hill is a 47 year old female with a history of migraine headaches.  She is currently on Topamax 50 mg at bedtime.  This continues to offer good improvement in her migraine headaches.  She reports only having approximately 2 headaches a month.  She typically can take over-the-counter medication (advil) and the headache will resolve in 2 to 3 hours.  She reports that she never took Toradol.  She states that the headache typically starts in the back of the head and neck.  She denies photophobia and phonophobia but does report nausea occasionally.  She denies any new neurological symptoms.  She returns today for evaluation.   HISTORY 01/27/17 (copied from Dr. Tobey Grim note): Kristie Hill is a 47 year old Hispanic female, right handed with a history of migraine headaches. She was having daily headaches, she has been placed on Topamax working up to 75 mg at night. Her headaches have been markedly improved, she is only having 1 a week and it is easily controlled with use of Toradol. The patient is not having any periods of being incapacitated because of the headache. The patient is sleeping well but she reports significant problems with fatigue on the current dose of the Topamax. She denies any significant problems with tingling in the fingers or toes or cognitive changes. She returns to this office for an evaluation. The history is taken through an interpreter.    REVIEW OF SYSTEMS: Out of a complete 14 system review of symptoms, the patient complains only of the following symptoms, and all other reviewed systems are negative.  See HPI  ALLERGIES: No Known Allergies  HOME MEDICATIONS: Outpatient Medications Prior to Visit  Medication Sig Dispense Refill  . hydrochlorothiazide (MICROZIDE) 12.5 MG capsule Take 1  capsule (12.5 mg total) by mouth daily. 30 capsule 11  . hydrocortisone (ANUSOL-HC) 2.5 % rectal cream Apply rectally 2 times daily 28.35 g 0  . norethindrone-ethinyl estradiol (JUNEL FE,GILDESS FE,LOESTRIN FE) 1-20 MG-MCG tablet Take 1 tablet by mouth daily. 3 Package 4  . topiramate (TOPAMAX) 50 MG tablet Take 1 tablet (50 mg total) by mouth at bedtime. 90 tablet 1   No facility-administered medications prior to visit.     PAST MEDICAL HISTORY: Past Medical History:  Diagnosis Date  . Anal fissure   . Blood type, Rh negative    O negative  . Classical migraine with intractable migraine 10/21/2016  . Hemorrhoids   . Hormone disorder 2012   hyperprolactinemia  . Hypertension   . Oligomenorrhea   . Recurrent pregnancy loss     PAST SURGICAL HISTORY: Past Surgical History:  Procedure Laterality Date  . DILATION AND CURETTAGE OF UTERUS  01/07/07  . DILATION AND CURETTAGE OF UTERUS  10/01/10   D&E  . DILATION AND CURETTAGE OF UTERUS    . LAPAROSCOPY  04/19/2012   Procedure: LAPAROSCOPY OPERATIVE;  Surgeon: Terrance Mass, MD;  Location: [facility];  Service: Gynecology;  Laterality: Right;  Excision of right para-tubal mass. Aspiration of Left ovarian cyst, pelvic washings     FAMILY HISTORY: Family History  Problem Relation Age of Onset  . Heart disease Father   . Hypertension Father   . Brain cancer Paternal Grandmother   . Lung cancer Paternal Grandfather   . Hypertension Mother     SOCIAL HISTORY: Social History   Socioeconomic History  . Marital  status: Married    Spouse name: Not on file  . Number of children: 0  . Years of education: Not on file  . Highest education level: Not on file  Occupational History  . Occupation: house keeping  Social Needs  . Financial resource strain: Not on file  . Food insecurity:    Worry: Not on file    Inability: Not on file  . Transportation needs:    Medical: Not on file    Non-medical: Not on file  Tobacco Use  .  Smoking status: Never Smoker  . Smokeless tobacco: Never Used  Substance and Sexual Activity  . Alcohol use: No  . Drug use: No  . Sexual activity: Yes    Partners: Male    Birth control/protection: Surgical    Comment: 1st intercourse- 17, partners- 24, married- 51 yrs   Lifestyle  . Physical activity:    Days per week: Not on file    Minutes per session: Not on file  . Stress: Not on file  Relationships  . Social connections:    Talks on phone: Not on file    Gets together: Not on file    Attends religious service: Not on file    Active member of club or organization: Not on file    Attends meetings of clubs or organizations: Not on file    Relationship status: Not on file  . Intimate partner violence:    Fear of current or ex partner: Not on file    Emotionally abused: Not on file    Physically abused: Not on file    Forced sexual activity: Not on file  Other Topics Concern  . Not on file  Social History Narrative   Emigrated from [place] 2009 approximately   Lives with father and husband   Caffeine use: Tea sometimes   Right-handed    She is a housekeeper with [employer]   No children      PHYSICAL EXAM  Vitals:   11/02/17 0747  BP: 128/84  Pulse: 72  Weight: 197 lb (89.4 kg)  Height: 5\' 3"  (1.6 m)   Body mass index is 34.9 kg/m.  Generalized: Well developed, in no acute distress   Neurological examination  Mentation: Alert oriented to time, place, history taking. Follows all commands speech and language fluent Cranial nerve II-XII: Pupils were equal round reactive to light. Extraocular movements were full, visual field were full on confrontational test. Facial sensation and strength were normal. Uvula tongue midline. Head turning and shoulder shrug  were normal and symmetric. Motor: The motor testing reveals 5 over 5 strength of all 4 extremities. Good symmetric motor tone is noted throughout.  Sensory: Sensory testing is intact to soft touch  on all 4 extremities. No evidence of extinction is noted.  Coordination: Cerebellar testing reveals good finger-nose-finger and heel-to-shin bilaterally.  Gait and station: Gait is normal. Tandem gait is normal. Romberg is negative. No drift is seen.  Reflexes: Deep tendon reflexes are symmetric and normal bilaterally.   DIAGNOSTIC DATA (LABS, IMAGING, TESTING) - I reviewed patient records, labs, notes, testing and imaging myself where available.  Lab Results  Component Value Date   WBC 8.5 07/07/2017   HGB 12.6 07/07/2017   HCT 37.5 07/07/2017   MCV 91.9 07/07/2017   PLT 270 07/07/2017      Component Value Date/Time   NA 140 07/07/2017 1053   K 4.1 07/07/2017 1053   CL 108 07/07/2017 1053  CO2 23 07/07/2017 1053   GLUCOSE 90 07/07/2017 1053   BUN 11 07/07/2017 1053   CREATININE 0.73 07/07/2017 1053   CALCIUM 9.5 07/07/2017 1053   PROT 7.0 07/07/2017 1053   ALBUMIN 4.3 02/27/2016 0930   AST 18 07/07/2017 1053   ALT 22 07/07/2017 1053   ALKPHOS 47 02/27/2016 0930   BILITOT 0.4 07/07/2017 1053   GFRNONAA 86 02/25/2016 1841   GFRAA >89 02/25/2016 1841   Lab Results  Component Value Date   CHOL 153 07/07/2017   HDL 49 (L) 07/07/2017   LDLCALC 81 07/07/2017   TRIG 126 07/07/2017   CHOLHDL 3.1 07/07/2017   Lab Results  Component Value Date   HGBA1C 5.2 11/02/2012    Lab Results  Component Value Date   TSH 0.89 07/07/2017      ASSESSMENT AND PLAN 47 y.o. year old female  has a past medical history of Anal fissure, Blood type, Rh negative, Classical migraine with intractable migraine (10/21/2016), Hemorrhoids, Hormone disorder (2012), Hypertension, Oligomenorrhea, and Recurrent pregnancy loss. here with:   1. Migraine headaches  Overall patient is doing well.  She will continue on Topamax 50 mg at bedtime.  I have advised that if her headache frequency or severity worsen she should let us know.  She will follow-up in 6 months or sooner if needed.   I spent 15  minutes with the patient. 50% of this time was spent discussing her medication and headache frequency   Ward Givens, MSN, NP-C 11/02/2017, 7:55 AM [facility] [address] [place], [place] [address] [phone]

## 2017-11-02 NOTE — Progress Notes (Signed)
I have read the note, and I agree with the clinical assessment and plan.  Lee Kuang K Whitni Pasquini   

## 2017-11-02 NOTE — Patient Instructions (Signed)
Your Plan:  Continue Topamax 50 mg at bedtime If your symptoms worsen or you develop new symptoms please let us know.    Thank you for coming to see [place] at [facility]. I hope we have been able to provide you high quality care today.  You may receive a patient satisfaction survey over the next few weeks. We would appreciate your feedback and comments so that we may continue to improve ourselves and the health of our patients.

## 2018-01-03 ENCOUNTER — Other Ambulatory Visit: Payer: Self-pay

## 2018-01-03 MED ORDER — NORETHIN ACE-ETH ESTRAD-FE 1-20 MG-MCG PO TABS: 1.0000 | ORAL_TABLET | Freq: Every day | ORAL | 1 refills | Status: DC

## 2018-01-06 ENCOUNTER — Encounter: Payer: Self-pay | Admitting: Anesthesiology

## 2018-01-26 ENCOUNTER — Telehealth: Payer: Self-pay | Admitting: Adult Health

## 2018-01-26 NOTE — Telephone Encounter (Signed)
Patient states topiramate (TOPAMAX) 50 MG tablet is not helping with headaches and would like to up Rx to 70mg . She uses Paediatric nurse at [facility].

## 2018-01-27 ENCOUNTER — Other Ambulatory Visit: Payer: Self-pay

## 2018-01-27 ENCOUNTER — Telehealth: Payer: Self-pay

## 2018-01-27 MED ORDER — HYDROCHLOROTHIAZIDE 12.5 MG PO CAPS: 12.5000 mg | ORAL_CAPSULE | Freq: Every day | ORAL | 0 refills | Status: AC

## 2018-01-27 MED ORDER — TOPIRAMATE 50 MG PO TABS: 100.0000 mg | ORAL_TABLET | Freq: Every day | ORAL | 3 refills | Status: DC

## 2018-01-27 NOTE — Addendum Note (Signed)
Addended by: Trudie Buckler on: 01/27/2018 10:41 AM   Modules accepted: Orders

## 2018-01-27 NOTE — Telephone Encounter (Signed)
Note sent to [facility] asking her to call patient and inform her. Rx has been sent by Dr. Marguerita Merles.

## 2018-01-27 NOTE — Telephone Encounter (Signed)
Former Dr. Moshe Salisbury patient. He prescribed HCTZ on 12/22/16. Looks like he was treating her for hypertension. Prior to that in 2017 her PCP had been treating her hypertension.

## 2018-01-27 NOTE — Telephone Encounter (Signed)
Please make sure the PCP resumes management of her cHTN.  Will approve refill x 4 weeks until she can be seen by her Fam MD.

## 2018-01-27 NOTE — Telephone Encounter (Signed)
I contacted Ms. Kristie Hill and advised on the increase of her Topamax. Patient verbalized understanding on the increase and had no further questions at this time. I advised if she had any problems picking the medication up or if the new dosage does not help to call our office back. MB RN

## 2018-01-27 NOTE — Telephone Encounter (Signed)
I sent Rosemarie Ax a message. "Can you let her know that Dr. Dellis Filbert received a refill request for her BP medication (hydrochlorothiazide) that Dr. Moshe Salisbury prescribed for her. Prior to him doing it PCP had prescribed it.    Will you let her know that Dr. Marguerita Merles does not treat BP. She agreed to send in one months supply for her so that she has time to arrange this prescription with her PCP. "  Refill was sent by Dr. Marguerita Merles and Rosemarie Ax did speak with patient and informed her.

## 2018-01-27 NOTE — Telephone Encounter (Signed)
Increase Topamax to 100 mg at bedtime.  A new prescription was sent in.  If her headaches do not improve with the increase she should let us know.

## 2018-02-24 ENCOUNTER — Other Ambulatory Visit: Payer: Self-pay | Admitting: Obstetrics & Gynecology

## 2018-04-14 ENCOUNTER — Ambulatory Visit: Payer: BLUE CROSS/BLUE SHIELD | Admitting: Obstetrics & Gynecology

## 2018-04-14 ENCOUNTER — Encounter: Payer: Self-pay | Admitting: Obstetrics & Gynecology

## 2018-04-14 VITALS — BP 126/84

## 2018-04-14 DIAGNOSIS — N911 Secondary amenorrhea: Secondary | ICD-10-CM | POA: Diagnosis not present

## 2018-04-14 DIAGNOSIS — R1031 Right lower quadrant pain: Secondary | ICD-10-CM | POA: Diagnosis not present

## 2018-04-14 DIAGNOSIS — G43909 Migraine, unspecified, not intractable, without status migrainosus: Secondary | ICD-10-CM

## 2018-04-14 NOTE — Progress Notes (Signed)
    Kristie Hill [birth year] [id number]        47 y.o.  [id number] married  RP: Rt pelvic pain and Amenorrhea  HPI: Right lower quadrant pain and amenorrhea since January 2019.  But patient was on Loestrin 1/20 at that time, which she stops 2 months ago and still did not have a menstrual period.  The right pelvic pain is intermittent.  Not taking medication for the pain.  No abnormal vaginal discharge.  No fever.   OB History  Gravida Para Term Preterm AB Living  5       5 0  SAB TAB Ectopic Multiple Live Births  5            # Outcome Date GA Lbr Len/2nd Weight Sex Delivery Anes PTL Lv  5 SAB           4 SAB           3 SAB           2 SAB           1 SAB             Past medical history,surgical history, problem list, medications, allergies, family history and social history were all reviewed and documented in the EPIC chart.   Directed ROS with pertinent positives and negatives documented in the history of present illness/assessment and plan.  Exam:  Vitals:   04/14/18 1511  BP: 126/84   General appearance:  Normal  Abdomen: Normal  Gynecologic exam: Vulva normal.  Bimanual exam: Uterus anteverted, normal volume, nontender, mobile.  No adnexal mass and no tenderness bilaterally.   Assessment/Plan:  47 y.o. G5P0A5   1. RLQ abdominal pain Possible pain of ovulation.  Follow-up for pelvic ultrasound to rule out a right ovarian cyst.  Will decide on Progestin-pill to block ovulation per results. - US Transvaginal Non-OB; Future  2. Secondary amenorrhea We will do a serum pregnancy test to rule out pregnancy.  Will evaluate for the possibility of PCOS, prolactin, TSH and hemoglobin A1c today as well as FSH. - hCG, serum, qualitative - Prolactin - TSH - FSH - Hemoglobin A1C  3. Migraine without status migrainosus, not intractable, unspecified migraine type Migraines without aura or neurological symptoms.  Counseling on above issues and coordination of care  more than 50% for 25 minutes.  [name] MD, 3:15 PM 04/14/2018

## 2018-04-15 LAB — TSH: TSH: 1.67 m[IU]/L

## 2018-04-15 LAB — PROLACTIN: PROLACTIN: 8.4 ng/mL

## 2018-04-15 LAB — HEMOGLOBIN A1C
EAG (MMOL/L): 6.5 (calc)
Hgb A1c MFr Bld: 5.7 % of total Hgb — ABNORMAL HIGH (ref <5.7)
Mean Plasma Glucose: 117 (calc)

## 2018-04-15 LAB — FOLLICLE STIMULATING HORMONE: FSH: 57.6 m[IU]/mL

## 2018-04-15 LAB — HCG, SERUM, QUALITATIVE: Preg, Serum: NEGATIVE

## 2018-04-17 ENCOUNTER — Encounter: Payer: Self-pay | Admitting: Obstetrics & Gynecology

## 2018-04-17 NOTE — Patient Instructions (Signed)
1. RLQ abdominal pain Possible pain of ovulation.  Follow-up for pelvic ultrasound to rule out a right ovarian cyst.  Will decide on Progestin-pill to block ovulation per results. - US Transvaginal Non-OB; Future  2. Secondary amenorrhea We will do a serum pregnancy test to rule out pregnancy.  Will evaluate for the possibility of PCOS, prolactin, TSH and hemoglobin A1c today as well as FSH. - hCG, serum, qualitative - Prolactin - TSH - FSH - Hemoglobin A1C  3. Migraine without status migrainosus, not intractable, unspecified migraine type Migraines without aura or neurological symptoms.  Kristie Hill, it was a pleasure seeing you today!  I will inform you of your results as soon as they are available.

## 2018-05-16 ENCOUNTER — Ambulatory Visit: Payer: BLUE CROSS/BLUE SHIELD | Admitting: Obstetrics & Gynecology

## 2018-05-16 ENCOUNTER — Ambulatory Visit (INDEPENDENT_AMBULATORY_CARE_PROVIDER_SITE_OTHER): Payer: BLUE CROSS/BLUE SHIELD

## 2018-05-16 ENCOUNTER — Encounter: Payer: Self-pay | Admitting: Obstetrics & Gynecology

## 2018-05-16 DIAGNOSIS — R1031 Right lower quadrant pain: Secondary | ICD-10-CM | POA: Diagnosis not present

## 2018-05-16 DIAGNOSIS — N951 Menopausal and female climacteric states: Secondary | ICD-10-CM

## 2018-05-16 MED ORDER — PROGESTERONE MICRONIZED 100 MG PO CAPS: 100.0000 mg | ORAL_CAPSULE | Freq: Every day | ORAL | 4 refills | Status: DC

## 2018-05-16 MED ORDER — ESTRADIOL 0.05 MG/24HR TD PTWK: 0.0500 mg | MEDICATED_PATCH | TRANSDERMAL | 4 refills | Status: DC

## 2018-05-16 NOTE — Patient Instructions (Signed)
1. RLQ abdominal pain Normal pelvic ultrasound with a normal right ovary and no right adnexal mass.  No free fluid in the pelvis.  The rest of the pelvic ultrasound was also reviewed with the patient, showing a normal uterus and left ovary.  Patient reassured.  Pain may be of intestinal origin or could be associated with a right sciatic nerve pain.  We will consult her family physician as needed.  2. Menopausal syndrome Very symptomatic menopause with vasomotor symptoms and postmenopausal atrophic vaginitis.  Desires starting on hormone replacement therapy.  No contraindication to hormone replacement therapy.  Usage, risks and benefits of hormone replacement therapy thoroughly reviewed.  Risks of blood clots/stroke as well as the increased risk of breast cancer after 10 years of use reviewed.  Benefits for controlling the vasomotor symptoms and improving sexual activity, bone preservation and relative protection for heart attacks reviewed.  Decision to start on estradiol patch 0.05 weekly and micronized progesterone 100 mg at bedtime.  Prescriptions sent to pharmacy.  Other orders - estradiol (CLIMARA - DOSED IN MG/24 HR) 0.05 mg/24hr patch; Place 1 patch (0.05 mg total) onto the skin once a week. - progesterone (PROMETRIUM) 100 MG capsule; Take 1 capsule (100 mg total) by mouth at bedtime.  Kristie Hill, fue un placer verle hoy!

## 2018-05-16 NOTE — Progress Notes (Signed)
    Kaysie Merino-Alcalde 11-07-1970 [id number]        47 y.o.  [id number]   RP: RLQ pain intermittently for Pelvic US  HPI: Mild pain on/off in the right lower quadrant.  Also experiencing occasional back pain irradiating to the back of the right leg.  Menopausal with no menstrual periods for the last 2 years.  Complaining of hot flushes and night sweats with difficulty sleeping.  Complains of dry skin.  Pain with intercourse because of vaginal dryness.  Would like to start on hormone replacement therapy.   OB History  Gravida Para Term Preterm AB Living  5       5 0  SAB TAB Ectopic Multiple Live Births  5            # Outcome Date GA Lbr Len/2nd Weight Sex Delivery Anes PTL Lv  5 SAB           4 SAB           3 SAB           2 SAB           1 SAB             Past medical history,surgical history, problem list, medications, allergies, family history and social history were all reviewed and documented in the EPIC chart.   Directed ROS with pertinent positives and negatives documented in the history of present illness/assessment and plan.  Exam:  There were no vitals filed for this visit. General appearance:  Normal  Pelvic US today: T/V images.  Uterus anteverted homogeneous measuring 6.56 x 4.32 x 2.04.  Endometrial lining thin at 1.7 mm.  Right and left ovaries normal.  No aberrant mass in the right and left adnexa.  No free fluid in the posterior cul-de-sac.   Assessment/Plan:  47 y.o. G5P0050   1. RLQ abdominal pain Normal pelvic ultrasound with a normal right ovary and no right adnexal mass.  No free fluid in the pelvis.  The rest of the pelvic ultrasound was also reviewed with the patient, showing a normal uterus and left ovary.  Patient reassured.  Pain may be of intestinal origin or could be associated with a right sciatic nerve pain.  We will consult her family physician as needed.  2. Menopausal syndrome Very symptomatic menopause with vasomotor symptoms and  postmenopausal atrophic vaginitis.  Desires starting on hormone replacement therapy.  No contraindication to hormone replacement therapy.  Usage, risks and benefits of hormone replacement therapy thoroughly reviewed.  Risks of blood clots/stroke as well as the increased risk of breast cancer after 10 years of use reviewed.  Benefits for controlling the vasomotor symptoms and improving sexual activity, bone preservation and relative protection for heart attacks reviewed.  Decision to start on estradiol patch 0.05 weekly and micronized progesterone 100 mg at bedtime.  Prescriptions sent to pharmacy.  Other orders - estradiol (CLIMARA - DOSED IN MG/24 HR) 0.05 mg/24hr patch; Place 1 patch (0.05 mg total) onto the skin once a week. - progesterone (PROMETRIUM) 100 MG capsule; Take 1 capsule (100 mg total) by mouth at bedtime.  Counseling on above issues and coordination of care more than 50% for 25 minutes.  [name] MD, 12:13 PM 05/16/2018

## 2018-08-12 ENCOUNTER — Encounter: Payer: BLUE CROSS/BLUE SHIELD | Admitting: Obstetrics & Gynecology

## 2018-08-19 ENCOUNTER — Encounter: Payer: BLUE CROSS/BLUE SHIELD | Admitting: Obstetrics & Gynecology

## 2018-08-24 DIAGNOSIS — K603 Anal fistula, unspecified: Secondary | ICD-10-CM | POA: Insufficient documentation

## 2018-08-26 ENCOUNTER — Encounter: Payer: BLUE CROSS/BLUE SHIELD | Admitting: Obstetrics & Gynecology

## 2018-08-30 ENCOUNTER — Encounter: Payer: Self-pay | Admitting: Nurse Practitioner

## 2018-08-30 ENCOUNTER — Ambulatory Visit: Payer: BLUE CROSS/BLUE SHIELD | Admitting: Nurse Practitioner

## 2018-08-30 ENCOUNTER — Telehealth: Payer: Self-pay | Admitting: Nurse Practitioner

## 2018-08-30 VITALS — BP 102/68 | HR 98 | Ht 63.0 in | Wt 186.0 lb

## 2018-08-30 DIAGNOSIS — K219 Gastro-esophageal reflux disease without esophagitis: Secondary | ICD-10-CM

## 2018-08-30 DIAGNOSIS — R1011 Right upper quadrant pain: Secondary | ICD-10-CM

## 2018-08-30 MED ORDER — FAMOTIDINE 20 MG PO TABS: 20.0000 mg | ORAL_TABLET | Freq: Every day | ORAL | 3 refills | Status: DC

## 2018-08-30 NOTE — Patient Instructions (Signed)
If you are age 47 or older, your body mass index should be between 23-30. Your Body mass index is 32.95 kg/m. If this is out of the aforementioned range listed, please consider follow up with your Primary Care Provider.  If you are age 2 or younger, your body mass index should be between 19-25. Your Body mass index is 32.95 kg/m. If this is out of the aformentioned range listed, please consider follow up with your Primary Care Provider.   You have been scheduled for an endoscopy. Please follow written instructions given to you at your visit today. If you use inhalers (even only as needed), please bring them with you on the day of your procedure. Your physician has requested that you go to www.startemmi.com and enter the access code given to you at your visit today. This web site gives a general overview about your procedure. However, you should still follow specific instructions given to you by our office regarding your preparation for the procedure.  We have sent the following medications to your pharmacy for you to pick up at your convenience: Pepcid  Thank you for choosing me and [facility].   Tye Savoy, NP

## 2018-08-30 NOTE — Progress Notes (Signed)
ASSESSMENT / PLAN:   66. 48 yo Non-English speaking Spanish female with chronic intermittent post-prandial RUQ pain, especially worse with fatty food consumption. Pain intermittent for a year or so, worse over the last 3 weeks. She has associated bloating, nausea and an mild but unintentional weight loss of ~ 11 pounds in less than a year. No cholelithiasis on u/s November 2018.  -Scheduled patient for EGD. The risks and benefits of EGD were discussed and the patient agrees to proceed.  -If EGD negative consider HIDA to check gallbladder functioning  2. GERD (probable).  She complains of mild burning in throat and describes globus sensation -Trial of Pepcid 20 mg in the a.m.  She can increase to twice daily if needed -Advised to go to bed on an empty stomach at night   HPI:    Chief Complaint:   Abdominal pain and globus sensation.   Patient is a 48 year old female who saw Dr. Carlean Purl January 2019 for evaluation of constipation, anal fissure, and transient RUQ pain which wasn't present at that time but had been in the Fall. Her liver tests were normal in Jan 2019.  Ultrasound Nov 2018 showed a 1 cm hemangioma on the liver, no gallstones (reviewed in [place])    Patient has returned, with an Interpreter, for evaluation of worsening RUQ pain with associated bloating and nausea leading to a diminished appetite. Pain non-radiating.   Overal the pain dates back almost 2 years.  Initially it was intermittent but over the last several weeks has become associated with nearly every meal.  Fatty foods are especially problematic. She also complains of diarrhea associated ONLY with consumption of fatty food.  Bowel movements otherwise normal.   Patient complains of knowing like phlegm is in her throat though she cannot clear anything out.  She endorses mild burning in her throat.  Has not tried any acid blockers.    Past Medical History:  Diagnosis Date  . Anal fissure   .  Blood type, Rh negative    O negative  . Classical migraine with intractable migraine 10/21/2016  . Hemorrhoids   . Hormone disorder 2012   hyperprolactinemia  . Hypertension   . Oligomenorrhea   . Recurrent pregnancy loss      Past Surgical History:  Procedure Laterality Date  . DILATION AND CURETTAGE OF UTERUS  01/07/07  . DILATION AND CURETTAGE OF UTERUS  10/01/10   D&E  . DILATION AND CURETTAGE OF UTERUS    . LAPAROSCOPY  04/19/2012   Procedure: LAPAROSCOPY OPERATIVE;  Surgeon: Terrance Mass, MD;  Location: [facility];  Service: Gynecology;  Laterality: Right;  Excision of right para-tubal mass. Aspiration of Left ovarian cyst, pelvic washings    Family History  Problem Relation Age of Onset  . Heart disease Father   . Hypertension Father   . Brain cancer Paternal Grandmother   . Lung cancer Paternal Grandfather   . Hypertension Mother   . Colon cancer Neg Hx   . Stomach cancer Neg Hx    Social History   Tobacco Use  . Smoking status: Never Smoker  . Smokeless tobacco: Never Used  Substance Use Topics  . Alcohol use: No  . Drug use: No   Current Outpatient Medications  Medication Sig Dispense Refill  . hydrochlorothiazide (MICROZIDE) 12.5 MG capsule Take 1 capsule (12.5 mg total) by mouth daily. 30 capsule 0  No current facility-administered medications for this visit.    No Known Allergies     Review of Systems:   Positive for weight loss. No fevers, no urinary symptoms.   Physical Exam:    Wt Readings from Last 3 Encounters:  08/30/18 186 lb (84.4 kg)  11/02/17 197 lb (89.4 kg)  07/21/17 189 lb (85.7 kg)    BP 102/68   Pulse 98   Ht 5\' 3"  (1.6 m)   Wt 186 lb (84.4 kg)   BMI 32.95 kg/m  Constitutional:  Pleasant female in no acute distress. Psychiatric: Normal mood and affect. Behavior is normal. EENT: Pupils normal.  Conjunctivae are normal. No scleral icterus. Neck supple.  Cardiovascular: Normal rate, regular rhythm. No  edema Pulmonary/chest: Effort normal and breath sounds normal. No wheezing, rales or rhonchi. Abdominal: Soft, nondistended, nontender. Bowel sounds active throughout. There are no masses palpable. No hepatomegaly. Neurological: Alert and oriented to person place and time. Skin: Skin is warm and dry. No rashes noted.  Tye Savoy, NP  08/30/2018, 3:27 PM

## 2018-08-30 NOTE — Progress Notes (Signed)
error 

## 2018-08-31 ENCOUNTER — Encounter: Payer: Self-pay | Admitting: *Deleted

## 2018-08-31 ENCOUNTER — Other Ambulatory Visit: Payer: Self-pay

## 2018-08-31 MED ORDER — AMBULATORY NON FORMULARY MEDICATION: 1 refills | Status: DC

## 2018-08-31 NOTE — Telephone Encounter (Signed)
Kristie Hill, the whole visit was about RUQ pain, she didn't talk about a fissure. If she is having significant rectal discomfort especially with bowel movements then we can treat her empirically.  We can try diltiazem gel plus lidocaine.  [employer] compounds this for [place].  Apply pea-sized amount into the anus and insert finger up to the first knuckle. Typically treat for 6-8 weeks. Thanks

## 2018-08-31 NOTE — Telephone Encounter (Signed)
Kristie Hill called and informed the patient as she speaks spanish.

## 2018-08-31 NOTE — Telephone Encounter (Signed)
Kristie Hill please advise. During her visit yesterday (08-30-2018) she didn't mention anything about rectal pain or a fissure.

## 2018-08-31 NOTE — Progress Notes (Unsigned)
Rx sent to [facility].

## 2018-09-02 ENCOUNTER — Ambulatory Visit (AMBULATORY_SURGERY_CENTER): Payer: BLUE CROSS/BLUE SHIELD | Admitting: Internal Medicine

## 2018-09-02 ENCOUNTER — Other Ambulatory Visit: Payer: Self-pay

## 2018-09-02 ENCOUNTER — Encounter: Payer: Self-pay | Admitting: Internal Medicine

## 2018-09-02 VITALS — BP 103/61 | HR 78 | Temp 99.1°F | Resp 18 | Ht 63.0 in | Wt 186.0 lb

## 2018-09-02 DIAGNOSIS — K3189 Other diseases of stomach and duodenum: Secondary | ICD-10-CM

## 2018-09-02 DIAGNOSIS — R1011 Right upper quadrant pain: Secondary | ICD-10-CM | POA: Diagnosis not present

## 2018-09-02 MED ORDER — SODIUM CHLORIDE 0.9 % IV SOLN: 500.0000 mL | Freq: Once | INTRAVENOUS | Status: DC

## 2018-09-02 NOTE — Patient Instructions (Addendum)
There is some inflammation in the stomach. Sometimes caused by an infection. I am checking for that with biopsies and will let you know.  Once I have those results (next week) we will contact you and make treatment changes and schedule follow-up.  I appreciate the opportunity to care for you. Gatha Mayer, MD, FACG  USTED TUVO UN PROCEDIMIENTO ENDOSCPICO HOY EN EL [facility]:   Lea el informe del procedimiento que se le entreg para cualquier pregunta especfica sobre lo que se encontr durante su examen.  Si el informe del examen no responde a sus preguntas, por favor llame a su gastroenterlogo para aclararlo.  Si usted solicit que no se le den [employer] de lo que se Estate manager/land agent en su procedimiento al [employer] va a cuidar, entonces el informe del procedimiento se ha incluido en un sobre sellado para que usted lo revise despus cuando le sea ms conveniente.   LO QUE PUEDE ESPERAR: Algunas sensaciones de hinchazn en el abdomen.  Puede tener ms gases de lo normal.  El caminar puede ayudarle a eliminar el aire que se le puso en el tracto gastrointestinal durante el procedimiento y reducir la hinchazn.  Si le hicieron una endoscopia inferior (como una colonoscopia o una sigmoidoscopia flexible), podra notar manchas de sangre en las heces fecales o en el papel higinico.  Si se someti a una preparacin intestinal para su procedimiento, es posible que no tenga una evacuacin intestinal normal durante [employer].   Tenga en cuenta:  Es posible que note un poco de irritacin y congestin en la nariz o algn drenaje.  Esto es debido al oxgeno [employer] durante su procedimiento.  No hay que preocuparse y esto debe desaparecer ms o Scientist, research (medical).   SNTOMAS PARA REPORTAR INMEDIATAMENTE:   Despus de la endoscopia superior (EGD)  Vmitos de Biochemist, clinical o material como caf molido   Dolor en el pecho o dolor debajo de los omplatos que antes no tena   Dolor o  dificultad persistente para tragar  Falta de aire que antes no tena   Fiebre de 100F o ms  Heces fecales negras y pegajosas   Para asuntos urgentes o de Freight forwarder, puede comunicarse con un gastroenterlogo a cualquier hora llamando al [phone].  DIETA:  Recomendamos una comida pequea al principio, pero luego puede continuar con su dieta normal.  Tome muchos lquidos, Teacher, adult education las bebidas alcohlicas durante 24 horas.    ACTIVIDAD:  Debe planear tomarse las cosas con calma por el resto del da y no debe CONDUCIR ni usar maquinaria pesada Programmer, applications (debido a los medicamentos de sedacin utilizados durante el examen).     SEGUIMIENTO: Nuestro personal llamar al nmero que aparece en su historial al siguiente da hbil de su procedimiento para ver cmo se siente y para responder cualquier pregunta o inquietud que pueda tener con respecto a la informacin que se le dio despus del procedimiento. Si no podemos contactarle, le dejaremos un mensaje.  Sin embargo, si se siente bien y no tiene Paediatric nurse, no es necesario que nos devuelva la llamada.  Asumiremos que ha regresado a sus actividades diarias normales sin incidentes. Si se le tomaron algunas biopsias, le contactaremos por telfono o por carta en las prximas 3 semanas.  Si no ha sabido [employer] biopsias en el transcurso de 3 semanas, por favor llmenos al [phone].   FIRMAS/CONFIDENCIALIDAD: Usted y/o el acompaante que le cuide han firmado documentos  que se ingresarn en su historial mdico electrnico.  Estas firmas atestiguan el hecho de que la informacin anterior

## 2018-09-02 NOTE — Op Note (Signed)
[facility] Patient Name: Kristie Hill Procedure Date: 09/02/2018 11:22 AM MRN: [id number] Endoscopist: Gatha Mayer , MD Age: 48 Referring MD:  Date of Birth: Nov 18, 1970 Gender: Female Account #: [id number] Procedure:                Upper GI endoscopy Indications:              Abdominal pain in the right upper quadrant,                            Abdominal bloating Medicines:                Propofol per Anesthesia, Monitored Anesthesia Care Procedure:                Pre-Anesthesia Assessment:                           - Prior to the procedure, a History and Physical                            was performed, and patient medications and                            allergies were reviewed. The patient's tolerance of                            previous anesthesia was also reviewed. The risks                            and benefits of the procedure and the sedation                            options and risks were discussed with the patient.                            All questions were answered, and informed consent                            was obtained. Prior Anticoagulants: The patient has                            taken no previous anticoagulant or antiplatelet                            agents. ASA Grade Assessment: II - A patient with                            mild systemic disease. After reviewing the risks                            and benefits, the patient was deemed in                            satisfactory condition to undergo the procedure.  After obtaining informed consent, the endoscope was                            passed under direct vision. Throughout the                            procedure, the patient's blood pressure, pulse, and                            oxygen saturations were monitored continuously. The                            Endoscope was introduced through the mouth, and                            advanced  to the second part of duodenum. The upper                            GI endoscopy was accomplished without difficulty.                            The patient tolerated the procedure well. Scope In: Scope Out: Findings:                 Multiple dispersed, diminutive non-bleeding                            erosions were found in the gastric antrum. There                            were no stigmata of recent bleeding. Biopsies were                            taken with a cold forceps for Helicobacter pylori                            testing using CLOtest. Verification of patient                            identification for the specimen was done. Estimated                            blood loss was minimal.                           The exam was otherwise without abnormality.                           The cardia and gastric fundus were normal on                            retroflexion. Complications:            No immediate complications. Estimated Blood Loss:     Estimated blood loss was minimal. Impression:               -  Non-bleeding erosive gastropathy. Biopsied - CLO                            test                           - The examination was otherwise normal. Recommendation:           - Patient has a contact number available for                            emergencies. The signs and symptoms of potential                            delayed complications were discussed with the                            patient. Return to normal activities tomorrow.                            Written discharge instructions were provided to the                            patient.                           - Resume previous diet.                           - Await pathology results and will recommend Tx                            changes and f/u                           Additional hx - fatty foods worst, stools seem                            greasy, bloating. Under significant stress. Pepcid                             helped some. Gatha Mayer, MD 09/02/2018 11:57:29 AM This report has been signed electronically.

## 2018-09-02 NOTE — Progress Notes (Signed)
Pt awake. VSS. Report given to RN. No anesthetic complications noted 

## 2018-09-02 NOTE — Progress Notes (Signed)
Called to room to assist during endoscopic procedure.  Patient ID and intended procedure confirmed with present staff. Received instructions for my participation in the procedure from the performing physician.  

## 2018-09-02 NOTE — Progress Notes (Signed)
Interpreter used today at the [facility] for this pt.  Interpreter's name is-Sylvia

## 2018-09-05 ENCOUNTER — Telehealth: Payer: Self-pay

## 2018-09-05 LAB — HELICOBACTER PYLORI SCREEN-BIOPSY: UREASE: NEGATIVE

## 2018-09-05 NOTE — Telephone Encounter (Signed)
  Follow up Call-  Call back number 09/02/2018  Post procedure Call Back phone  # [phone]  Permission to leave phone message Yes  Some recent data might be hidden     Patient questions:  Do you have a fever, pain , or abdominal swelling? No. Pain Score  0 *  Have you tolerated food without any problems? Yes.    Have you been able to return to your normal activities? Yes.    Do you have any questions about your discharge instructions: Diet   No. Medications  No. Follow up visit  No.  Do you have questions or concerns about your Care? No.  Actions: * If pain score is 4 or above: No action needed, pain <4.  Pt spoke english to say "I am good, no questions".  maw

## 2018-09-07 ENCOUNTER — Other Ambulatory Visit: Payer: Self-pay | Admitting: Internal Medicine

## 2018-09-07 MED ORDER — PANTOPRAZOLE SODIUM 40 MG PO TBEC: 40.0000 mg | DELAYED_RELEASE_TABLET | Freq: Every day | ORAL | 0 refills | Status: DC

## 2018-09-07 NOTE — Progress Notes (Signed)
I sent in pantoprazole rx to take for 2 months to treat her gastritis  Testing negative for H pylori infection  Need spanish speaker to call and explain - no infection, has inflammation and we will reduce acid and should be better If after 2 months still bothering her call back

## 2018-09-08 ENCOUNTER — Telehealth: Payer: Self-pay | Admitting: Internal Medicine

## 2018-09-08 ENCOUNTER — Other Ambulatory Visit: Payer: Self-pay

## 2018-09-08 NOTE — Telephone Encounter (Signed)
Read the result note to Kristie Hill in [place]. Explained that medications was sent to her pharmacy. Verbalized understanding and had no further questions. Gave her my name if she thought of any questions later to call back.

## 2018-09-09 ENCOUNTER — Ambulatory Visit (INDEPENDENT_AMBULATORY_CARE_PROVIDER_SITE_OTHER): Payer: BLUE CROSS/BLUE SHIELD | Admitting: Obstetrics & Gynecology

## 2018-09-09 ENCOUNTER — Encounter: Payer: Self-pay | Admitting: Obstetrics & Gynecology

## 2018-09-09 VITALS — BP 126/84 | Ht 64.0 in | Wt 189.0 lb

## 2018-09-09 DIAGNOSIS — Z01419 Encounter for gynecological examination (general) (routine) without abnormal findings: Secondary | ICD-10-CM

## 2018-09-09 DIAGNOSIS — Z780 Asymptomatic menopausal state: Secondary | ICD-10-CM | POA: Diagnosis not present

## 2018-09-09 DIAGNOSIS — Z6832 Body mass index (BMI) 32.0-32.9, adult: Secondary | ICD-10-CM

## 2018-09-09 DIAGNOSIS — E6609 Other obesity due to excess calories: Secondary | ICD-10-CM

## 2018-09-09 NOTE — Progress Notes (Signed)
Kristie Hill 1970-11-28 [id number]   History:    48 y.o. G30P0A5 Married  RP:  Established patient presenting for annual gyn exam   HPI: Menopause, well on no hormone replacement therapy.  No postmenopausal bleeding.  No pelvic pain.  No pain with intercourse.  Urine and bowel movements normal.  Breast normal.  Body mass index at 32.44.  Not exercising regularly.  Will establish with a family physician for health labs.  Past medical history,surgical history, family history and social history were all reviewed and documented in the EPIC chart.  Gynecologic History Patient's last menstrual period was 07/15/2017. Contraception: post menopausal status Last Pap: 06/2017. Results were: Negative, TZ cells absent. Last mammogram: 05/2016. Results were: Negative Bone Density: Never Colonoscopy: Never  Obstetric History OB History  Gravida Para Term Preterm AB Living  5       5 0  SAB TAB Ectopic Multiple Live Births  5            # Outcome Date GA Lbr Len/2nd Weight Sex Delivery Anes PTL Lv  5 SAB           4 SAB           3 SAB           2 SAB           1 SAB              ROS: A ROS was performed and pertinent positives and negatives are included in the history.  GENERAL: No fevers or chills. HEENT: No change in vision, no earache, sore throat or sinus congestion. NECK: No pain or stiffness. CARDIOVASCULAR: No chest pain or pressure. No palpitations. PULMONARY: No shortness of breath, cough or wheeze. GASTROINTESTINAL: No abdominal pain, nausea, vomiting or diarrhea, melena or bright red blood per rectum. GENITOURINARY: No urinary frequency, urgency, hesitancy or dysuria. MUSCULOSKELETAL: No joint or muscle pain, no back pain, no recent trauma. DERMATOLOGIC: No rash, no itching, no lesions. ENDOCRINE: No polyuria, polydipsia, no heat or cold intolerance. No recent change in weight. HEMATOLOGICAL: No anemia or easy bruising or bleeding. NEUROLOGIC: No headache, seizures,  numbness, tingling or weakness. PSYCHIATRIC: No depression, no loss of interest in normal activity or change in sleep pattern.     Exam:   BP 126/84   Ht 5\' 4"  (1.626 m)   Wt 189 lb (85.7 kg)   LMP 07/15/2017   BMI 32.44 kg/m   Body mass index is 32.44 kg/m.  General appearance : Well developed well nourished female. No acute distress HEENT: Eyes: no retinal hemorrhage or exudates,  Neck supple, trachea midline, no carotid bruits, no thyroidmegaly Lungs: Clear to auscultation, no rhonchi or wheezes, or rib retractions  Heart: Regular rate and rhythm, no murmurs or gallops Breast:Examined in sitting and supine position were symmetrical in appearance, no palpable masses or tenderness,  no skin retraction, no nipple inversion, no nipple discharge, no skin discoloration, no axillary or supraclavicular lymphadenopathy Abdomen: no palpable masses or tenderness, no rebound or guarding Extremities: no edema or skin discoloration or tenderness  Pelvic: Vulva: Normal             Vagina: No gross lesions or discharge  Cervix: No gross lesions or discharge.  Pap reflex done.  Uterus  AV, normal size, shape and consistency, non-tender and mobile  Adnexa  Without masses or tenderness  Anus: Normal   Assessment/Plan:  48 y.o. female for annual exam   1. Encounter  for gynecological examination with Papanicolaou smear of cervix Normal gynecologic exam.  Pap test January 2019 was negative but transitional zone cells were absent, Pap reflex done today.  Breast exam normal.  Patient will schedule a screening mammogram now.  Will establish with a family physician for health labs. - Pap IG w/ reflex to HPV when ASC-U  2. Menopause Menopause, well on no hormone replacement therapy.  No postmenopausal bleeding.  Vitamin D supplements, calcium intake of 1.2 to 1.5 g/day, regular weightbearing physical activity is recommended.  3. Class 1 obesity due to excess calories without serious comorbidity with  body mass index (BMI) of 32.0 to 32.9 in adult Recommend a lower calorie/carb diet such as [employer].  Aerobic physical activities 5 times a week and weightlifting every 2 days.  [name] MD, 3:06 PM 09/09/2018

## 2018-09-11 ENCOUNTER — Encounter: Payer: Self-pay | Admitting: Obstetrics & Gynecology

## 2018-09-11 NOTE — Patient Instructions (Signed)
1. Encounter for gynecological examination with Papanicolaou smear of cervix Normal gynecologic exam.  Pap test January 2019 was negative but transitional zone cells were absent, Pap reflex done today.  Breast exam normal.  Patient will schedule a screening mammogram now.  Will establish with a family physician for health labs. - Pap IG w/ reflex to HPV when ASC-U  2. Menopause Menopause, well on no hormone replacement therapy.  No postmenopausal bleeding.  Vitamin D supplements, calcium intake of 1.2 to 1.5 g/day, regular weightbearing physical activity is recommended.  3. Class 1 obesity due to excess calories without serious comorbidity with body mass index (BMI) of 32.0 to 32.9 in adult Recommend a lower calorie/carb diet such as [employer].  Aerobic physical activities 5 times a week and weightlifting every 2 days.  Kamila, fue un placer verle hoy!  voy a informarle de sus [employer].

## 2018-09-12 LAB — PAP IG W/ RFLX HPV ASCU

## 2018-10-20 DIAGNOSIS — K253 Acute gastric ulcer without hemorrhage or perforation: Secondary | ICD-10-CM | POA: Insufficient documentation

## 2018-11-03 ENCOUNTER — Telehealth: Payer: Self-pay

## 2018-11-03 NOTE — Telephone Encounter (Signed)
Unable to get in contact with the patient to convert her office visit with Judson Roch on 11/07/2018 into a doxy.me visit. I left a voicemail asking her to return my call. Office number was provided.  If patient calls back please convert her office visit into a doxy.me visit

## 2018-11-07 ENCOUNTER — Ambulatory Visit: Payer: BLUE CROSS/BLUE SHIELD | Admitting: Adult Health

## 2018-11-07 ENCOUNTER — Ambulatory Visit: Payer: BLUE CROSS/BLUE SHIELD | Admitting: Neurology

## 2018-12-27 ENCOUNTER — Encounter: Payer: Self-pay | Admitting: Obstetrics & Gynecology

## 2019-01-09 NOTE — Progress Notes (Signed)
PATIENT: Kristie Hill DOB: June 21, 1971  REASON FOR VISIT: follow up HISTORY FROM: patient  HISTORY OF PRESENT ILLNESS: Today 01/10/19  Kristie Hill is a 48 year old female with history of migraine headaches.  She is currently taking Topamax 100 mg at bedtime.  She is Spanish-speaking, requires interpreter who is present.  She reports her headaches are doing much better.  On average she may have 2 headaches a month, she will take Advil with good benefit.  She describes her typical headache as right-sided, she denies nausea or vomiting.  She reports occasional photophobia.  She works [employer].  She has history of hypertension.  She denies any new problems or concerns.  She presents today for follow-up accompanied by an interpreter.   HISTORY 11/02/2017 MM: Kristie Hill is a 48 year old female with a history of migraine headaches.  She is currently on Topamax 50 mg at bedtime.  This continues to offer good improvement in her migraine headaches.  She reports only having approximately 2 headaches a month.  She typically can take over-the-counter medication (advil) and the headache will resolve in 2 to 3 hours.  She reports that she never took Toradol.  She states that the headache typically starts in the back of the head and neck.  She denies photophobia and phonophobia but does report nausea occasionally.  She denies any new neurological symptoms.  She returns today for evaluation.  REVIEW OF SYSTEMS: Out of a complete 14 system review of symptoms, the patient complains only of the following symptoms, and all other reviewed systems are negative.  Headache  ALLERGIES: No Known Allergies  HOME MEDICATIONS: Outpatient Medications Prior to Visit  Medication Sig Dispense Refill  . famotidine (PEPCID) 20 MG tablet Take 1 tablet (20 mg total) by mouth daily. Take 30 minutes before breakfast.  Can increase to twice daily if needed. 60 tablet 3  . hydrochlorothiazide  (MICROZIDE) 12.5 MG capsule Take 1 capsule (12.5 mg total) by mouth daily. 30 capsule 0  . pantoprazole (PROTONIX) 40 MG tablet Take 1 tablet (40 mg total) by mouth daily before breakfast. 60 tablet 0  . topiramate (TOPAMAX) 50 MG tablet Take 50 mg by mouth daily. 2 pills at bedtime     No facility-administered medications prior to visit.     PAST MEDICAL HISTORY: Past Medical History:  Diagnosis Date  . Anal fissure   . Anxiety   . Blood type, Rh negative    O negative  . Cataract   . Classical migraine with intractable migraine 10/21/2016  . Hemorrhoids   . Hormone disorder 2012   hyperprolactinemia  . Hypertension   . Oligomenorrhea   . Recurrent pregnancy loss     PAST SURGICAL HISTORY: Past Surgical History:  Procedure Laterality Date  . DILATION AND CURETTAGE OF UTERUS  01/07/07  . DILATION AND CURETTAGE OF UTERUS  10/01/10   D&E  . DILATION AND CURETTAGE OF UTERUS    . LAPAROSCOPY  04/19/2012   Procedure: LAPAROSCOPY OPERATIVE;  Surgeon: Terrance Mass, MD;  Location: [facility];  Service: Gynecology;  Laterality: Right;  Excision of right para-tubal mass. Aspiration of Left ovarian cyst, pelvic washings     FAMILY HISTORY: Family History  Problem Relation Age of Onset  . Heart disease Father   . Hypertension Father   . Brain cancer Paternal Grandmother   . Lung cancer Paternal Grandfather   . Hypertension Mother   . Colon cancer Neg Hx   . Stomach cancer Neg Hx   .  Esophageal cancer Neg Hx   . Rectal cancer Neg Hx     SOCIAL HISTORY: Social History   Socioeconomic History  . Marital status: Married    Spouse name: Not on file  . Number of children: 0  . Years of education: Not on file  . Highest education level: Not on file  Occupational History  . Occupation: house keeping  Social Needs  . Financial resource strain: Not on file  . Food insecurity    Worry: Not on file    Inability: Not on file  . Transportation needs    Medical: Not on file     Non-medical: Not on file  Tobacco Use  . Smoking status: Never Smoker  . Smokeless tobacco: Never Used  Substance and Sexual Activity  . Alcohol use: No  . Drug use: No  . Sexual activity: Yes    Partners: Male    Birth control/protection: Surgical, None    Comment: 1st intercourse- 17, partners- 66, married- 14 yrs   Lifestyle  . Physical activity    Days per week: Not on file    Minutes per session: Not on file  . Stress: Not on file  Relationships  . Social Herbalist on phone: Not on file    Gets together: Not on file    Attends religious service: Not on file    Active member of club or organization: Not on file    Attends meetings of clubs or organizations: Not on file    Relationship status: Not on file  . Intimate partner violence    Fear of current or ex partner: Not on file    Emotionally abused: Not on file    Physically abused: Not on file    Forced sexual activity: Not on file  Other Topics Concern  . Not on file  Social History Narrative   Emigrated from [place] 2009 approximately   Lives with father and husband   Caffeine use: Tea sometimes   Right-handed    She is a housekeeper with [employer]   No children      PHYSICAL EXAM  Vitals:   01/10/19 1419  BP: 129/90  Pulse: 87  Temp: 97.8 F (36.6 C)  Weight: 198 lb 3.2 oz (89.9 kg)  Height: 5\' 4"  (1.626 m)   Body mass index is 34.02 kg/m.  Generalized: Well developed, in no acute distress   Neurological examination  Mentation: Alert oriented to time, place, history taking. Follows all commands speech and language fluent Cranial nerve II-XII: Pupils were equal round reactive to light. Extraocular movements were full, visual field were full on confrontational test. Facial sensation and strength were normal. Uvula tongue midline. Head turning and shoulder shrug  were normal and symmetric. Motor: The motor testing reveals 5 over 5 strength of all 4 extremities. Good symmetric  motor tone is noted throughout.  Sensory: Sensory testing is intact to soft touch on all 4 extremities. No evidence of extinction is noted.  Coordination: Cerebellar testing reveals good finger-nose-finger and heel-to-shin bilaterally.  Gait and station: Gait is normal. Tandem gait is normal. Romberg is negative. No drift is seen.  Reflexes: Deep tendon reflexes are symmetric and normal bilaterally.   DIAGNOSTIC DATA (LABS, IMAGING, TESTING) - I reviewed patient records, labs, notes, testing and imaging myself where available.  Lab Results  Component Value Date   WBC 8.5 07/07/2017   HGB 12.6 07/07/2017   HCT 37.5 07/07/2017   MCV  91.9 07/07/2017   PLT 270 07/07/2017      Component Value Date/Time   NA 140 07/07/2017 1053   K 4.1 07/07/2017 1053   CL 108 07/07/2017 1053   CO2 23 07/07/2017 1053   GLUCOSE 90 07/07/2017 1053   BUN 11 07/07/2017 1053   CREATININE 0.73 07/07/2017 1053   CALCIUM 9.5 07/07/2017 1053   PROT 7.0 07/07/2017 1053   ALBUMIN 4.3 02/27/2016 0930   AST 18 07/07/2017 1053   ALT 22 07/07/2017 1053   ALKPHOS 47 02/27/2016 0930   BILITOT 0.4 07/07/2017 1053   GFRNONAA 86 02/25/2016 1841   GFRAA >89 02/25/2016 1841   Lab Results  Component Value Date   CHOL 153 07/07/2017   HDL 49 (L) 07/07/2017   LDLCALC 81 07/07/2017   TRIG 126 07/07/2017   CHOLHDL 3.1 07/07/2017   Lab Results  Component Value Date   HGBA1C 5.7 (H) 04/14/2018   No results found for: VITAMINB12 Lab Results  Component Value Date   TSH 1.67 04/14/2018   ASSESSMENT AND PLAN 48 y.o. year old female  has a past medical history of Anal fissure, Anxiety, Blood type, Rh negative, Cataract, Classical migraine with intractable migraine (10/21/2016), Hemorrhoids, Hormone disorder (2012), Hypertension, Oligomenorrhea, and Recurrent pregnancy loss. here with:  1.  Migraine headaches  Overall she is doing quite well.  She reports she has on average about 2 headaches a month, that are relieved  with Advil.  She will continue taking Topamax 100 mg at bedtime.  I have sent in a prescription.  She will follow-up in 1 year or sooner if needed.  I advised that if her symptoms worsen or if she develops any new symptoms she should let us know.   I spent 15 minutes with the patient. 50% of this time was spent discussing the plan of care.   Butler Denmark, AGNP-C, DNP 01/10/2019, 2:42 PM [facility] [address], [place], [place] [address] [phone]

## 2019-01-10 ENCOUNTER — Encounter: Payer: Self-pay | Admitting: Neurology

## 2019-01-10 ENCOUNTER — Ambulatory Visit: Payer: BLUE CROSS/BLUE SHIELD | Admitting: Neurology

## 2019-01-10 ENCOUNTER — Other Ambulatory Visit: Payer: Self-pay

## 2019-01-10 VITALS — BP 129/90 | HR 87 | Temp 97.8°F | Ht 64.0 in | Wt 198.2 lb

## 2019-01-10 DIAGNOSIS — G43119 Migraine with aura, intractable, without status migrainosus: Secondary | ICD-10-CM

## 2019-01-10 MED ORDER — TOPIRAMATE 100 MG PO TABS: 100.0000 mg | ORAL_TABLET | Freq: Every day | ORAL | 3 refills | Status: DC

## 2019-01-10 NOTE — Progress Notes (Signed)
I have read the note, and I agree with the clinical assessment and plan.  Charles K Willis   

## 2019-10-09 DIAGNOSIS — E669 Obesity, unspecified: Secondary | ICD-10-CM | POA: Insufficient documentation

## 2020-01-16 ENCOUNTER — Ambulatory Visit: Payer: BLUE CROSS/BLUE SHIELD | Admitting: Neurology

## 2020-02-27 ENCOUNTER — Encounter: Payer: Self-pay | Admitting: Obstetrics & Gynecology

## 2020-02-27 ENCOUNTER — Ambulatory Visit: Payer: BLUE CROSS/BLUE SHIELD | Admitting: Obstetrics & Gynecology

## 2020-02-27 ENCOUNTER — Other Ambulatory Visit: Payer: Self-pay

## 2020-02-27 VITALS — BP 134/86 | Ht 64.0 in | Wt 203.0 lb

## 2020-02-27 DIAGNOSIS — Z6832 Body mass index (BMI) 32.0-32.9, adult: Secondary | ICD-10-CM

## 2020-02-27 DIAGNOSIS — E6609 Other obesity due to excess calories: Secondary | ICD-10-CM

## 2020-02-27 DIAGNOSIS — Z01419 Encounter for gynecological examination (general) (routine) without abnormal findings: Secondary | ICD-10-CM

## 2020-02-27 DIAGNOSIS — Z780 Asymptomatic menopausal state: Secondary | ICD-10-CM | POA: Diagnosis not present

## 2020-02-27 NOTE — Progress Notes (Signed)
Kristie Hill 11-07-1970 [id number]   History:    49 y.o.  G60P0A5 Married  RP:  Established patient presenting for annual gyn exam   HPI: Menopause, well on no hormone replacement therapy.  No postmenopausal bleeding.  No pelvic pain.  No pain with intercourse.  Urine and bowel movements normal.  Breast normal.  Body mass index at 34.84.  Not exercising regularly.  Will establish with a family physician for health labs.  Past medical history,surgical history, family history and social history were all reviewed and documented in the EPIC chart.  Gynecologic History Patient's last menstrual period was 07/15/2017.  Obstetric History OB History  Gravida Para Term Preterm AB Living  5       5 0  SAB TAB Ectopic Multiple Live Births  5            # Outcome Date GA Lbr Len/2nd Weight Sex Delivery Anes PTL Lv  5 SAB           4 SAB           3 SAB           2 SAB           1 SAB              ROS: A ROS was performed and pertinent positives and negatives are included in the history.  GENERAL: No fevers or chills. HEENT: No change in vision, no earache, sore throat or sinus congestion. NECK: No pain or stiffness. CARDIOVASCULAR: No chest pain or pressure. No palpitations. PULMONARY: No shortness of breath, cough or wheeze. GASTROINTESTINAL: No abdominal pain, nausea, vomiting or diarrhea, melena or bright red blood per rectum. GENITOURINARY: No urinary frequency, urgency, hesitancy or dysuria. MUSCULOSKELETAL: No joint or muscle pain, no back pain, no recent trauma. DERMATOLOGIC: No rash, no itching, no lesions. ENDOCRINE: No polyuria, polydipsia, no heat or cold intolerance. No recent change in weight. HEMATOLOGICAL: No anemia or easy bruising or bleeding. NEUROLOGIC: No headache, seizures, numbness, tingling or weakness. PSYCHIATRIC: No depression, no loss of interest in normal activity or change in sleep pattern.     Exam:   BP 134/86   Ht 5\' 4"  (1.626 m)   Wt 203 lb  (92.1 kg)   LMP 07/15/2017   BMI 34.84 kg/m   Body mass index is 34.84 kg/m.  General appearance : Well developed well nourished female. No acute distress HEENT: Eyes: no retinal hemorrhage or exudates,  Neck supple, trachea midline, no carotid bruits, no thyroidmegaly Lungs: Clear to auscultation, no rhonchi or wheezes, or rib retractions  Heart: Regular rate and rhythm, no murmurs or gallops Breast:Examined in sitting and supine position were symmetrical in appearance, no palpable masses or tenderness,  no skin retraction, no nipple inversion, no nipple discharge, no skin discoloration, no axillary or supraclavicular lymphadenopathy Abdomen: no palpable masses or tenderness, no rebound or guarding Extremities: no edema or skin discoloration or tenderness  Pelvic: Vulva: Normal             Vagina: No gross lesions or discharge  Cervix: No gross lesions or discharge.  Pap reflex done.  Uterus AV, normal size, shape and consistency, non-tender and mobile  Adnexa  Without masses or tenderness  Anus: Normal   Assessment/Plan:  49 y.o. female for annual exam   1. Encounter for gynecological examination with Papanicolaou smear of cervix Normal gynecologic exam.  Pap reflex done.  Breast exam normal.  Screening mammogram July 2021  was negative.  Health labs with family physician.  2. Postmenopause Well on no hormone replacement therapy.  No postmenopausal bleeding.  3. Class 1 obesity due to excess calories without serious comorbidity with body mass index (BMI) of 32.0 to 32.9 in adult Recommend a lower calorie/carb diet such as [employer].  Aerobic activities 5 times a week and light weightlifting every 2 days.  [name] MD, 3:58 PM 02/27/2020

## 2020-02-28 ENCOUNTER — Encounter: Payer: Self-pay | Admitting: Obstetrics & Gynecology

## 2020-02-29 LAB — PAP IG W/ RFLX HPV ASCU

## 2020-03-07 DIAGNOSIS — Z780 Asymptomatic menopausal state: Secondary | ICD-10-CM | POA: Insufficient documentation

## 2020-03-07 DIAGNOSIS — R232 Flushing: Secondary | ICD-10-CM | POA: Insufficient documentation

## 2021-02-27 ENCOUNTER — Ambulatory Visit: Payer: Self-pay | Admitting: Obstetrics & Gynecology

## 2021-04-28 ENCOUNTER — Encounter: Payer: Self-pay | Admitting: Obstetrics & Gynecology

## 2021-04-28 ENCOUNTER — Other Ambulatory Visit: Payer: Self-pay

## 2021-04-28 ENCOUNTER — Ambulatory Visit (INDEPENDENT_AMBULATORY_CARE_PROVIDER_SITE_OTHER): Payer: BC Managed Care – PPO | Admitting: Obstetrics & Gynecology

## 2021-04-28 VITALS — BP 124/80 | Ht 64.0 in | Wt 201.0 lb

## 2021-04-28 DIAGNOSIS — Z6834 Body mass index (BMI) 34.0-34.9, adult: Secondary | ICD-10-CM

## 2021-04-28 DIAGNOSIS — Z01419 Encounter for gynecological examination (general) (routine) without abnormal findings: Secondary | ICD-10-CM

## 2021-04-28 DIAGNOSIS — Z780 Asymptomatic menopausal state: Secondary | ICD-10-CM | POA: Diagnosis not present

## 2021-04-28 DIAGNOSIS — E6609 Other obesity due to excess calories: Secondary | ICD-10-CM

## 2021-04-28 NOTE — Progress Notes (Signed)
Kristie Hill October 02, 1970 [id number]   History:    50 y.o. G30P0A5 Married   RP:  Established patient presenting for annual gyn exam    HPI: Postmenopause, well on no hormone replacement therapy.  No postmenopausal bleeding.  No pelvic pain.  No pain with intercourse.  Pap Neg 02/2020. Urine and bowel movements normal.  Breast normal.  Mammo scheduled tomorrow. Body mass index at 34.5.  Walking regularly.  Will establish with a family physician for health labs.  Recommend screening Colono now.   Past medical history,surgical history, family history and social history were all reviewed and documented in the EPIC chart.  Gynecologic History Patient's last menstrual period was 07/15/2017.  Obstetric History OB History  Gravida Para Term Preterm AB Living  5       5 0  SAB IAB Ectopic Multiple Live Births  5            # Outcome Date GA Lbr Len/2nd Weight Sex Delivery Anes PTL Lv  5 SAB           4 SAB           3 SAB           2 SAB           1 SAB              ROS: A ROS was performed and pertinent positives and negatives are included in the history.  GENERAL: No fevers or chills. HEENT: No change in vision, no earache, sore throat or sinus congestion. NECK: No pain or stiffness. CARDIOVASCULAR: No chest pain or pressure. No palpitations. PULMONARY: No shortness of breath, cough or wheeze. GASTROINTESTINAL: No abdominal pain, nausea, vomiting or diarrhea, melena or bright red blood per rectum. GENITOURINARY: No urinary frequency, urgency, hesitancy or dysuria. MUSCULOSKELETAL: No joint or muscle pain, no back pain, no recent trauma. DERMATOLOGIC: No rash, no itching, no lesions. ENDOCRINE: No polyuria, polydipsia, no heat or cold intolerance. No recent change in weight. HEMATOLOGICAL: No anemia or easy bruising or bleeding. NEUROLOGIC: No headache, seizures, numbness, tingling or weakness. PSYCHIATRIC: No depression, no loss of interest in normal activity or change in sleep  pattern.     Exam:   BP 124/80 (BP Location: Right Arm, Patient Position: Sitting, Cuff Size: Large)   Ht 5\' 4"  (1.626 m)   Wt 201 lb (91.2 kg)   LMP 07/15/2017   BMI 34.50 kg/m   Body mass index is 34.5 kg/m.  General appearance : Well developed well nourished female. No acute distress HEENT: Eyes: no retinal hemorrhage or exudates,  Neck supple, trachea midline, no carotid bruits, no thyroidmegaly Lungs: Clear to auscultation, no rhonchi or wheezes, or rib retractions  Heart: Regular rate and rhythm, no murmurs or gallops Breast:Examined in sitting and supine position were symmetrical in appearance, no palpable masses or tenderness,  no skin retraction, no nipple inversion, no nipple discharge, no skin discoloration, no axillary or supraclavicular lymphadenopathy Abdomen: no palpable masses or tenderness, no rebound or guarding Extremities: no edema or skin discoloration or tenderness  Pelvic: Vulva: Normal             Vagina: No gross lesions or discharge  Cervix: No gross lesions or discharge  Uterus  AV, normal size, shape and consistency, non-tender and mobile  Adnexa  Without masses or tenderness  Anus: Normal   Assessment/Plan:  50 y.o. female for annual exam   1. Well female exam with routine gynecological  exam Postmenopause, well on no hormone replacement therapy.  No postmenopausal bleeding.  No pelvic pain.  No pain with intercourse.  Pap Neg 02/2020. Urine and bowel movements normal.  Breast normal.  Mammo scheduled tomorrow. Body mass index at 34.5.  Walking regularly.  Will establish with a family physician for health labs.  Recommend screening Colono now.  2. Postmenopause Well on no HRT.  No PMB.  3. Class 1 obesity due to excess calories without serious comorbidity with body mass index (BMI) of 34.0 to 34.9 in adult  Lower Calories/Carb diet.  Increase fitness activities.  [name] MD, 4:01 PM 04/28/2021

## 2021-05-01 ENCOUNTER — Encounter: Payer: Self-pay | Admitting: Obstetrics & Gynecology

## 2021-05-02 ENCOUNTER — Encounter: Payer: Self-pay | Admitting: Obstetrics & Gynecology

## 2021-07-16 ENCOUNTER — Encounter: Payer: Self-pay | Admitting: Family Medicine

## 2022-05-01 ENCOUNTER — Ambulatory Visit (INDEPENDENT_AMBULATORY_CARE_PROVIDER_SITE_OTHER): Payer: BC Managed Care – PPO | Admitting: Obstetrics & Gynecology

## 2022-05-01 ENCOUNTER — Encounter: Payer: Self-pay | Admitting: Obstetrics & Gynecology

## 2022-05-01 ENCOUNTER — Other Ambulatory Visit (HOSPITAL_COMMUNITY)
Admission: RE | Admit: 2022-05-01 | Discharge: 2022-05-01 | Disposition: A | Discharge status: Home / Self Care | Payer: BC Managed Care – PPO | Source: Ambulatory Visit | Attending: Obstetrics & Gynecology | Admitting: Obstetrics & Gynecology

## 2022-05-01 VITALS — BP 120/80 | HR 87 | Ht 63.75 in | Wt 189.0 lb

## 2022-05-01 DIAGNOSIS — Z780 Asymptomatic menopausal state: Secondary | ICD-10-CM | POA: Diagnosis not present

## 2022-05-01 DIAGNOSIS — E6609 Other obesity due to excess calories: Secondary | ICD-10-CM | POA: Diagnosis not present

## 2022-05-01 DIAGNOSIS — Z01419 Encounter for gynecological examination (general) (routine) without abnormal findings: Secondary | ICD-10-CM | POA: Diagnosis present

## 2022-05-01 NOTE — Progress Notes (Signed)
Kristie Hill [birth year] [id number]   History:    51 y.o.  G34P0A5 Married   RP:  Established patient presenting for annual gyn exam    HPI: Postmenopause, well on no hormone replacement therapy.  No postmenopausal bleeding.  No pelvic pain.  No pain with intercourse.  Pap Neg 02/2020. Pap reflex today.  Urine and bowel movements normal. Breast normal.  Mammo scheduled next Tuesday. Body mass index improved at 32.7.  Lower calorie/carb diet x last year.  Walking regularly. Will establish with a family physician for health labs.  Recommend screening Colono now.  Past medical history,surgical history, family history and social history were all reviewed and documented in the EPIC chart.  Gynecologic History Patient's last menstrual period was 07/15/2017.  Obstetric History OB History  Gravida Para Term Preterm AB Living  5       5 0  SAB IAB Ectopic Multiple Live Births  5            # Outcome Date GA Lbr Len/2nd Weight Sex Delivery Anes PTL Lv  5 SAB           4 SAB           3 SAB           2 SAB           1 SAB             ROS: A ROS was performed and pertinent positives and negatives are included in the history. GENERAL: No fevers or chills. HEENT: No change in vision, no earache, sore throat or sinus congestion. NECK: No pain or stiffness. CARDIOVASCULAR: No chest pain or pressure. No palpitations. PULMONARY: No shortness of breath, cough or wheeze. GASTROINTESTINAL: No abdominal pain, nausea, vomiting or diarrhea, melena or bright red blood per rectum. GENITOURINARY: No urinary frequency, urgency, hesitancy or dysuria. MUSCULOSKELETAL: No joint or muscle pain, no back pain, no recent trauma. DERMATOLOGIC: No rash, no itching, no lesions. ENDOCRINE: No polyuria, polydipsia, no heat or cold intolerance. No recent change in weight. HEMATOLOGICAL: No anemia or easy bruising or bleeding. NEUROLOGIC: No headache, seizures, numbness, tingling or weakness. PSYCHIATRIC: No  depression, no loss of interest in normal activity or change in sleep pattern.     Exam:   BP 120/80   Pulse 87   Ht 5' 3.75" (1.619 m)   Wt 189 lb (85.7 kg)   LMP 07/15/2017   SpO2 98%   BMI 32.70 kg/m   Body mass index is 32.7 kg/m.  General appearance : Well developed well nourished female. No acute distress HEENT: Eyes: no retinal hemorrhage or exudates,  Neck supple, trachea midline, no carotid bruits, no thyroidmegaly Lungs: Clear to auscultation, no rhonchi or wheezes, or rib retractions  Heart: Regular rate and rhythm, no murmurs or gallops Breast:Examined in sitting and supine position were symmetrical in appearance, no palpable masses or tenderness,  no skin retraction, no nipple inversion, no nipple discharge, no skin discoloration, no axillary or supraclavicular lymphadenopathy Abdomen: no palpable masses or tenderness, no rebound or guarding Extremities: no edema or skin discoloration or tenderness  Pelvic: Vulva: Normal             Vagina: No gross lesions or discharge  Cervix: No gross lesions or discharge.  Pap reflex done.  Uterus  AV, normal size, shape and consistency, non-tender and mobile  Adnexa  Without masses or tenderness  Anus: Normal   Assessment/Plan:  51 y.o. female for  annual exam   1. Encounter for routine gynecological examination with Papanicolaou smear of cervix Postmenopause, well on no hormone replacement therapy.  No postmenopausal bleeding.  No pelvic pain.  No pain with intercourse.  Pap Neg 02/2020. Pap reflex today.  Urine and bowel movements normal. Breast normal.  Mammo scheduled next Tuesday. Body mass index improved at 32.7.  Lower calorie/carb diet x last year.  Walking regularly. Will establish with a family physician for health labs.  Recommend screening Colono now. - Cytology - PAP( [place])  2. Postmenopause Postmenopause, well on no hormone replacement therapy.  No postmenopausal bleeding.  No pelvic pain.  No pain with  intercourse.   3. Class 1 obesity due to excess calories with serious comorbidity and body mass index (BMI) of 32.0 to 32.9 in adult Body mass index improved at 32.7.  Lower calorie/carb diet x last year.  Walking regularly.   Other orders - amLODipine (NORVASC) 5 MG tablet; Take 5 mg by mouth daily.   [name] MD, 3:56 PM 05/01/2022

## 2022-05-04 LAB — CYTOLOGY - PAP
Adequacy: ABSENT
Diagnosis: NEGATIVE

## 2022-05-06 ENCOUNTER — Encounter: Payer: Self-pay | Admitting: Obstetrics & Gynecology

## 2022-05-08 ENCOUNTER — Other Ambulatory Visit: Payer: Self-pay | Admitting: *Deleted

## 2022-05-08 MED ORDER — FLUCONAZOLE 150 MG PO TABS: ORAL_TABLET | ORAL | 0 refills | Status: DC

## 2022-06-23 NOTE — Progress Notes (Signed)
[facility] Note  07/07/2022     CHIEF COMPLAINT Patient presents for Retina Evaluation   HISTORY OF PRESENT ILLNESS: Kristie Hill is a 52 y.o. female who presents to the clinic today for:   HPI     Retina Evaluation   In both eyes.  This started 1 month ago.  Duration of 1 month.  I, the attending physician,  performed the HPI with the patient and updated documentation appropriately.        Comments   Retina eval per Dr. Hinda Lenis for cataract clearance pt is reporting no vision changes noticed       Last edited by Bernarda Caffey, MD on 07/09/2022  1:13 PM.    Pt is here on the referral of Dr. Lucita Ferrara for cataract clearance, pt states she used to see Dr. Jerline Pain at [place] and she told the pt she was born with a "scar on her right eye", pt feels like her glasses are too thick and hurt her nose, see states with her CL she can see, but not very good, pt has only seen Dr. Lucita Ferrara once and no date for cat sx has been set  Referring physician: Vevelyn Royals, MD [address] [place],  [place] [id number]  HISTORICAL INFORMATION:   Selected notes from the MEDICAL RECORD NUMBER Referred by Dr. Lucita Ferrara for cataract clearance LEE:  Ocular Hx- PMH-    CURRENT MEDICATIONS: No current outpatient medications on file. (Ophthalmic Drugs)   No current facility-administered medications for this visit. (Ophthalmic Drugs)   Current Outpatient Medications (Other)  Medication Sig   fluconazole (DIFLUCAN) 150 MG tablet Take one tablet by mouth now and repeat every other day.   amLODipine (NORVASC) 5 MG tablet Take 5 mg by mouth daily.   hydrochlorothiazide (MICROZIDE) 12.5 MG capsule Take 1 capsule (12.5 mg total) by mouth daily.   No current facility-administered medications for this visit. (Other)   REVIEW OF SYSTEMS: ROS   Positive for: Eyes Last edited by Bernarda Caffey, MD on 07/09/2022  1:13 PM.     ALLERGIES No Known  Allergies  PAST MEDICAL HISTORY Past Medical History:  Diagnosis Date   Anal fissure    Anxiety    Blood type, Rh negative    O negative   Cataract    Classical migraine with intractable migraine 10/21/2016   Hemorrhoids    Hormone disorder 2012   hyperprolactinemia   Hypertension    Oligomenorrhea    Recurrent pregnancy loss    Past Surgical History:  Procedure Laterality Date   DILATION AND CURETTAGE OF UTERUS  01/07/07   DILATION AND CURETTAGE OF UTERUS  10/01/10   D&E   DILATION AND CURETTAGE OF UTERUS     LAPAROSCOPY  04/19/2012   Procedure: LAPAROSCOPY OPERATIVE;  Surgeon: Terrance Mass, MD;  Location: [facility];  Service: Gynecology;  Laterality: Right;  Excision of right para-tubal mass. Aspiration of Left ovarian cyst, pelvic washings    FAMILY HISTORY Family History  Problem Relation Age of Onset   Heart disease Father    Hypertension Father    Brain cancer Paternal Grandmother    Lung cancer Paternal Grandfather    Hypertension Mother    Colon cancer Neg Hx    Stomach cancer Neg Hx    Esophageal cancer Neg Hx    Rectal cancer Neg Hx    SOCIAL HISTORY Social History   Tobacco Use   Smoking status: Never  Smokeless tobacco: Never  Vaping Use   Vaping Use: Never used  Substance Use Topics   Alcohol use: Yes    Comment: a little bit   Drug use: No       OPHTHALMIC EXAM:  Base Eye Exam     Visual Acuity (Snellen - Linear)       Right Left   Dist [facility] 20/150 -2 20/30 -3   Dist ph [place] NI NI    Correction: Contacts         Tonometry (Tonopen, 1:35 PM)       Right Left   Pressure 17 20         Pupils       Pupils Dark Light Shape React APD   Right PERRL 4 3 Round Brisk None   Left PERRL 4 3 Round Brisk None         Visual Fields       Left Right    Full Full         Extraocular Movement       Right Left    Full, Ortho Full, Ortho         Neuro/Psych     Oriented x3: Yes   Mood/Affect: Normal         Dilation      Both eyes: 2.5% Phenylephrine @ 1:35 PM           Slit Lamp and Fundus Exam     Slit Lamp Exam       Right Left   Lids/Lashes mild MGD mild MGD   Conjunctiva/Sclera White and quiet White and quiet   Cornea Clear Clear   Anterior Chamber deep and clear deep and clear   Iris Round and dilated Round and dilated   Lens 2+ Nuclear sclerosis, 2+ Cortical cataract 2+ Nuclear sclerosis, 2+ Cortical cataract   Anterior Vitreous mild syneresis, Posterior vitreous detachment, vitreous condensations mild syneresis, mild vitreous condensations         Fundus Exam       Right Left   Disc Severe tilt, temporal PPA Severe tilt, temporal PPA, Pink and Sharp   C/D Ratio 0.3 0.3   Macula Blunted foveal reflex, scattered patches of RPE clumping and atrophy greatest centrally, no heme, +schisis Flat, Good foveal reflex, RPE mottling and clumping, No heme or edema   Vessels mild attenuation, mild tortuosity mild attenuation, mild tortuosity   Periphery Attached, mild cystoid degeneration, No heme Attached, No heme           Refraction     Manifest Refraction       Sphere Cylinder Dist VA   Right -15.25 Sphere 20/200   Left -14.00 Sphere 20/25-2           IMAGING AND PROCEDURES  Imaging and Procedures for 07/07/2022  OCT, Retina - OU - Both Eyes       Right Eye Quality was good. Central Foveal Thickness: 357. Progression has no prior data. Findings include no SRF, abnormal foveal contour, myopic contour, subretinal hyper-reflective material, epiretinal membrane, intraretinal fluid, outer retinal atrophy, preretinal fibrosis.   Left Eye Quality was good. Central Foveal Thickness: 240. Progression has no prior data. Findings include normal foveal contour, no IRF, no SRF, myopic contour, epiretinal membrane, preretinal fibrosis.   Notes *Images captured and stored on drive  Diagnosis / Impression:  OD: myopic degeneration w/ foveoschisis and ORA; +ERM w/ preretinal  fibrosis inferiorly OS: NFP; no IRF/SRF; +myopic contour; +ERM w/  preretinal fibrosis inferiorly  Clinical management:  See below  Abbreviations: NFP - Normal foveal profile. CME - cystoid macular edema. PED - pigment epithelial detachment. IRF - intraretinal fluid. SRF - subretinal fluid. EZ - ellipsoid zone. ERM - epiretinal membrane. ORA - outer retinal atrophy. ORT - outer retinal tubulation. SRHM - subretinal hyper-reflective material. IRHM - intraretinal hyper-reflective material            ASSESSMENT/PLAN:    ICD-10-CM   1. 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 Essential hypertension  I10     3. Hypertensive retinopathy of both eyes  H35.033     4. Combined forms of age-related cataract of both eyes  H25.813     5. Amblyopia of right eye  H53.001      Myopic degeneration with foveoschisis OD - OCT shows severely myopic contour OU -- OD with foveoschisis and outer retinal atrophy - no active CNV or heme - discussed findings, prognosis - no retinal or ophthalmic interventions indicated or recommended  - clear from a retina standpoint to proceed with cataract surgery when pt and surgeon are ready   - f/u 2-3 months, DFE, OCT, FA (transit OD)  2. Amblyopia OD  3. Epiretinal membrane, both eyes  - The natural history, anatomy, potential for loss of vision, and treatment options including vitrectomy techniques and the complications of endophthalmitis, retinal detachment, vitreous hemorrhage, cataract progression and permanent vision loss discussed with the patient. - +ERM w/ mild preretinal fibrosis inferiorly OU - no indication for surgery at this time - monitor - f/u 2-3 mos -- DFE/OCT   4,5 Hypertensive retinopathy OU - discussed importance of tight BP control - monitor  6. Mixed Cataract OU - The symptoms of cataract, surgical options, and treatments and risks were discussed with patient. -  discussed diagnosis and progression - under the expert management of Dr. Lucita Ferrara - clear from a retina standpoint to proceed with cataract surgery when pt and surgeon are ready - discussed the possibility of visual improvement post-cataract surgery being limited by myopic degeneration w/ foveoschisis and amblyopia OD, ERM OU  Ophthalmic Meds Ordered this visit:  No orders of the defined types were placed in this encounter.    Return for f/u myopic degeneration OD, DFE, OCT, FA (transit OD).  There are no Patient Instructions on file for this visit.   Explained the diagnoses, plan, and follow up with the patient and they expressed understanding.  Patient expressed understanding of the importance of proper follow up care.   This document serves as a record of services personally performed by Gardiner Sleeper, MD, PhD. It was created on their behalf by Renaldo Reel, [place] an ophthalmic technician. The creation of this record is the provider's dictation and/or activities during the visit.    Electronically signed by:  Renaldo Reel, COT  01.02.24 1:18 PM  This document serves as a record of services personally performed by Gardiner Sleeper, MD, PhD. It was created on their behalf by San Jetty. Owens Shark, OA an ophthalmic technician. The creation of this record is the provider's dictation and/or activities during the visit.    Electronically signed by: San Jetty. Owens Shark, [place] 01.16.2024 1:18 PM  Gardiner Sleeper, M.D., Ph.D. Diseases & Surgery of the Retina and Vitreous [facility]  I have reviewed the above documentation for accuracy and completeness, and I agree with the above. Aaron Edelman  Antony Haste, M.D., Ph.D. 07/09/22 1:26 PM   Abbreviations: M myopia (nearsighted); A astigmatism; H hyperopia (farsighted); P presbyopia; Mrx spectacle prescription;  CTL contact lenses; OD right eye; OS left eye; OU both eyes  XT exotropia; ET esotropia; PEK punctate epithelial  keratitis; PEE punctate epithelial erosions; DES dry eye syndrome; MGD meibomian gland dysfunction; ATs artificial tears; PFAT's preservative free artificial tears; [place] nuclear sclerotic cataract; PSC posterior subcapsular cataract; ERM epi-retinal membrane; PVD posterior vitreous detachment; RD retinal detachment; DM diabetes mellitus; DR diabetic retinopathy; NPDR non-proliferative diabetic retinopathy; PDR proliferative diabetic retinopathy; CSME clinically significant macular edema; DME diabetic macular edema; dbh dot blot hemorrhages; CWS cotton wool spot; POAG primary open angle glaucoma; C/D cup-to-disc ratio; HVF humphrey visual field; GVF goldmann visual field; OCT optical coherence tomography; IOP intraocular pressure; BRVO Branch retinal vein occlusion; CRVO central retinal vein occlusion; CRAO central retinal artery occlusion; BRAO branch retinal artery occlusion; RT retinal tear; SB scleral buckle; PPV pars plana vitrectomy; VH Vitreous hemorrhage; PRP panretinal laser photocoagulation; IVK intravitreal kenalog; VMT vitreomacular traction; MH Macular hole;  NVD neovascularization of the disc; NVE neovascularization elsewhere; AREDS age related eye disease study; ARMD age related macular degeneration; POAG primary open angle glaucoma; EBMD epithelial/anterior basement membrane dystrophy; ACIOL anterior chamber intraocular lens; IOL intraocular lens; PCIOL posterior chamber intraocular lens; Phaco/IOL phacoemulsification with intraocular lens placement; [place] photorefractive keratectomy; LASIK laser assisted in situ keratomileusis; HTN hypertension; DM diabetes mellitus; COPD chronic obstructive pulmonary disease

## 2022-07-07 ENCOUNTER — Ambulatory Visit (INDEPENDENT_AMBULATORY_CARE_PROVIDER_SITE_OTHER): Payer: BC Managed Care – PPO | Admitting: Ophthalmology

## 2022-07-07 DIAGNOSIS — H35373 Puckering of macula, bilateral: Secondary | ICD-10-CM

## 2022-07-07 DIAGNOSIS — I10 Essential (primary) hypertension: Secondary | ICD-10-CM | POA: Diagnosis not present

## 2022-07-07 DIAGNOSIS — H442D1 Degenerative myopia with foveoschisis, right eye: Secondary | ICD-10-CM

## 2022-07-07 DIAGNOSIS — H25813 Combined forms of age-related cataract, bilateral: Secondary | ICD-10-CM

## 2022-07-07 DIAGNOSIS — H35033 Hypertensive retinopathy, bilateral: Secondary | ICD-10-CM | POA: Diagnosis not present

## 2022-07-07 DIAGNOSIS — H53001 Unspecified amblyopia, right eye: Secondary | ICD-10-CM

## 2022-07-09 ENCOUNTER — Encounter (INDEPENDENT_AMBULATORY_CARE_PROVIDER_SITE_OTHER): Payer: Self-pay | Admitting: Ophthalmology

## 2022-09-29 DIAGNOSIS — R5381 Other malaise: Secondary | ICD-10-CM | POA: Insufficient documentation

## 2022-10-06 ENCOUNTER — Encounter (INDEPENDENT_AMBULATORY_CARE_PROVIDER_SITE_OTHER): Payer: BC Managed Care – PPO | Admitting: Ophthalmology

## 2022-10-06 ENCOUNTER — Encounter (INDEPENDENT_AMBULATORY_CARE_PROVIDER_SITE_OTHER): Payer: Self-pay

## 2022-10-06 DIAGNOSIS — H25813 Combined forms of age-related cataract, bilateral: Secondary | ICD-10-CM

## 2022-10-06 DIAGNOSIS — H35033 Hypertensive retinopathy, bilateral: Secondary | ICD-10-CM

## 2022-10-06 DIAGNOSIS — H442D1 Degenerative myopia with foveoschisis, right eye: Secondary | ICD-10-CM

## 2022-10-06 DIAGNOSIS — I10 Essential (primary) hypertension: Secondary | ICD-10-CM

## 2022-10-06 DIAGNOSIS — H53001 Unspecified amblyopia, right eye: Secondary | ICD-10-CM

## 2022-10-06 DIAGNOSIS — H35373 Puckering of macula, bilateral: Secondary | ICD-10-CM

## 2023-02-23 DIAGNOSIS — I10 Essential (primary) hypertension: Secondary | ICD-10-CM | POA: Insufficient documentation

## 2023-02-23 DIAGNOSIS — R7303 Prediabetes: Secondary | ICD-10-CM | POA: Insufficient documentation

## 2023-05-05 ENCOUNTER — Encounter: Payer: Self-pay | Admitting: Obstetrics and Gynecology

## 2023-05-05 ENCOUNTER — Ambulatory Visit (INDEPENDENT_AMBULATORY_CARE_PROVIDER_SITE_OTHER): Payer: BC Managed Care – PPO | Admitting: Obstetrics and Gynecology

## 2023-05-05 ENCOUNTER — Ambulatory Visit: Payer: BC Managed Care – PPO | Admitting: Obstetrics & Gynecology

## 2023-05-05 VITALS — BP 118/72 | HR 77 | Ht 64.57 in | Wt 189.0 lb

## 2023-05-05 DIAGNOSIS — E661 Drug-induced obesity: Secondary | ICD-10-CM

## 2023-05-05 DIAGNOSIS — Z01419 Encounter for gynecological examination (general) (routine) without abnormal findings: Secondary | ICD-10-CM

## 2023-05-05 DIAGNOSIS — Z1211 Encounter for screening for malignant neoplasm of colon: Secondary | ICD-10-CM

## 2023-05-05 NOTE — Progress Notes (Signed)
52 y.o. G35P0050 female with HTN here for annual exam. Married. Working in Education officer, environmental at [employer]. Spanish interpreter, Danford Bad, was present for our visit.  Patient's last menstrual period was 07/15/2017.   Abnormal bleeding: none Pelvic discharge or pain: none Breast mass, nipple discharge or skin changes : none Last PAP:     Component Value Date/Time   DIAGPAP  05/01/2022 1613    - Negative for intraepithelial lesion or malignancy (NILM)   ADEQPAP  05/01/2022 1613    Satisfactory for evaluation; transformation zone component ABSENT.   Last mammogram: 05/04/22 BIRADS 1, density A, scheduled 05/11/23 Last colonoscopy: never Sexually active: yes  Exercising: Exercise 2x week - 30 mins   GYN HISTORY: History of hyperprolactinemia, normal PRL 2019  OB History  Gravida Para Term Preterm AB Living  5       5 0  SAB IAB Ectopic Multiple Live Births  5            # Outcome Date GA Lbr Len/2nd Weight Sex Type Anes PTL Lv  5 SAB           4 SAB           3 SAB           2 SAB           1 SAB             Past Medical History:  Diagnosis Date   Anal fissure    Anxiety    Blood type, Rh negative    O negative   Cataract    Classical migraine with intractable migraine 10/21/2016   Hemorrhoids    Hormone disorder 2012   hyperprolactinemia   Hypertension    Oligomenorrhea    Recurrent pregnancy loss     Past Surgical History:  Procedure Laterality Date   DILATION AND CURETTAGE OF UTERUS  01/07/07   DILATION AND CURETTAGE OF UTERUS  10/01/10   D&E   DILATION AND CURETTAGE OF UTERUS     LAPAROSCOPY  04/19/2012   Procedure: LAPAROSCOPY OPERATIVE;  Surgeon: Ok Edwards, MD;  Location: [facility];  Service: Gynecology;  Laterality: Right;  Excision of right para-tubal mass. Aspiration of Left ovarian cyst, pelvic washings     Current Outpatient Medications on File Prior to Visit  Medication Sig Dispense Refill   amLODipine (NORVASC) 5 MG tablet Take 5 mg by mouth daily.      hydrochlorothiazide (MICROZIDE) 12.5 MG capsule Take 1 capsule (12.5 mg total) by mouth daily. 30 capsule 0   No current facility-administered medications on file prior to visit.    Social History   Socioeconomic History   Marital status: Married    Spouse name: Not on file   Number of children: 0   Years of education: Not on file   Highest education level: Not on file  Occupational History   Occupation: house keeping  Tobacco Use   Smoking status: Never   Smokeless tobacco: Never  Vaping Use   Vaping status: Never Used  Substance and Sexual Activity   Alcohol use: Yes    Comment: a little bit   Drug use: No   Sexual activity: Yes    Partners: Male    Birth control/protection: Post-menopausal    Comment: 1st intercourse- 82, partners- 3,  Other Topics Concern   Not on file  Social History Narrative   Emigrated from [place] 2009 approximately   Lives with father and husband  Caffeine use: Tea sometimes   Right-handed    She is a housekeeper with [employer]   No children   Social Determinants of Health   Financial Resource Strain: Not on file  Food Insecurity: Not on file  Transportation Needs: Not on file  Physical Activity: Not on file  Stress: Not on file  Social Connections: Not on file  Intimate Partner Violence: Not on file    Family History  Problem Relation Age of Onset   Heart disease Father    Hypertension Father    Brain cancer Paternal Grandmother    Lung cancer Paternal Grandfather    Hypertension Mother    Colon cancer Neg Hx    Stomach cancer Neg Hx    Esophageal cancer Neg Hx    Rectal cancer Neg Hx     No Known Allergies    PE Today's Vitals   05/05/23 1512  BP: 118/72  Pulse: 77  SpO2: 98%  Weight: 189 lb (85.7 kg)  Height: 5' 4.57" (1.64 m)   Body mass index is 31.87 kg/m.  Physical Exam Vitals reviewed. Exam conducted with a chaperone present.  Constitutional:      General: She is not in acute  distress.    Appearance: Normal appearance.  HENT:     Head: Normocephalic and atraumatic.     Nose: Nose normal.  Eyes:     Extraocular Movements: Extraocular movements intact.     Conjunctiva/sclera: Conjunctivae normal.  Neck:     Thyroid: No thyroid mass, thyromegaly or thyroid tenderness.  Pulmonary:     Effort: Pulmonary effort is normal.  Chest:     Chest wall: No mass or tenderness.  Breasts:    Right: Normal. No swelling, mass, nipple discharge, skin change or tenderness.     Left: Normal. No swelling, mass, nipple discharge, skin change or tenderness.  Abdominal:     General: There is no distension.     Palpations: Abdomen is soft.     Tenderness: There is no abdominal tenderness.  Genitourinary:    General: Normal vulva.     Exam position: Lithotomy position.     Urethra: No prolapse.     Vagina: Normal. No vaginal discharge or bleeding.     Cervix: Normal. No lesion.     Uterus: Normal. Not enlarged and not tender.      Adnexa: Right adnexa normal and left adnexa normal.  Musculoskeletal:        General: Normal range of motion.     Cervical back: Normal range of motion.  Lymphadenopathy:     Upper Body:     Right upper body: No axillary adenopathy.     Left upper body: No axillary adenopathy.     Lower Body: No right inguinal adenopathy. No left inguinal adenopathy.  Skin:    General: Skin is warm and dry.  Neurological:     General: No focal deficit present.     Mental Status: She is alert.  Psychiatric:        Mood and Affect: Mood normal.        Behavior: Behavior normal.       Assessment and Plan:        Well woman exam with routine gynecological exam Assessment & Plan: Cervical cancer screening performed according to ASCCP guidelines. Encouraged annual mammogram screening Colonoscopy referral ordered Labs and immunizations with her primary Encouraged safe sexual practices as indicated Encouraged healthy lifestyle practices with diet and  exercise  For patients under 50-70yo, I recommend 1200mg  calcium daily and 600IU of vitamin D daily.    Colon cancer screening -     Ambulatory referral to Gastroenterology  Class 1 drug-induced obesity with serious comorbidity and body mass index (BMI) of 31.0 to 31.9 in adult Assessment & Plan: Congratulated on weight maintenance over the last year Encouraged continued healthy diet and more regular exercise.      Rosalyn Gess, MD

## 2023-05-05 NOTE — Assessment & Plan Note (Signed)
Cervical cancer screening performed according to ASCCP guidelines. Encouraged annual mammogram screening Colonoscopy referral ordered Labs and immunizations with her primary Encouraged safe sexual practices as indicated Encouraged healthy lifestyle practices with diet and exercise For patients under 50-51yo, I recommend 1200mg  calcium daily and 600IU of vitamin D daily.

## 2023-05-05 NOTE — Assessment & Plan Note (Signed)
Congratulated on weight maintenance over the last year Encouraged continued healthy diet and more regular exercise.

## 2023-05-05 NOTE — Patient Instructions (Signed)
For patients under 50-51yo, I recommend 1200mg  calcium daily and 600IU of vitamin D daily. For patients over 51yo, I recommend 1200mg  calcium daily and 800IU of vitamin D daily.  Health Maintenance, Female Adopting a healthy lifestyle and getting preventive care are important in promoting health and wellness. Ask your health care provider about: The right schedule for you to have regular tests and exams. Things you can do on your own to prevent diseases and keep yourself healthy. What should I know about diet, weight, and exercise? Eat a healthy diet  Eat a diet that includes plenty of vegetables, fruits, low-fat dairy products, and lean protein. Do not eat a lot of foods that are high in solid fats, added sugars, or sodium. Maintain a healthy weight Body mass index (BMI) is used to identify weight problems. It estimates body fat based on height and weight. Your health care provider can help determine your BMI and help you achieve or maintain a healthy weight. Get regular exercise Get regular exercise. This is one of the most important things you can do for your health. Most adults should: Exercise for at least 150 minutes each week. The exercise should increase your heart rate and make you sweat (moderate-intensity exercise). Do strengthening exercises at least twice a week. This is in addition to the moderate-intensity exercise. Spend less time sitting. Even light physical activity can be beneficial. Watch cholesterol and blood lipids Have your blood tested for lipids and cholesterol at 51 years of age, then have this test every 5 years. Have your cholesterol levels checked more often if: Your lipid or cholesterol levels are high. You are older than 51 years of age. You are at high risk for heart disease. What should I know about cancer screening? Depending on your health history and family history, you may need to have cancer screening at various ages. This may include screening  for: Breast cancer. Cervical cancer. Colorectal cancer. Skin cancer. Lung cancer. What should I know about heart disease, diabetes, and high blood pressure? Blood pressure and heart disease High blood pressure causes heart disease and increases the risk of stroke. This is more likely to develop in people who have high blood pressure readings or are overweight. Have your blood pressure checked: Every 3-5 years if you are 83-51 years of age. Every year if you are 4 years old or older. Diabetes Have regular diabetes screenings. This checks your fasting blood sugar level. Have the screening done: Once every three years after age 68 if you are at a normal weight and have a low risk for diabetes. More often and at a younger age if you are overweight or have a high risk for diabetes. What should I know about preventing infection? Hepatitis B If you have a higher risk for hepatitis B, you should be screened for this virus. Talk with your health care provider to find out if you are at risk for hepatitis B infection. Hepatitis C Testing is recommended for: Everyone born from [birth year] through 1964. Anyone with known risk factors for hepatitis C. Sexually transmitted infections (STIs) Get screened for STIs, including gonorrhea and chlamydia, if: You are sexually active and are younger than 51 years of age. You are older than 51 years of age and your health care provider tells you that you are at risk for this type of infection. Your sexual activity has changed since you were last screened, and you are at increased risk for chlamydia or gonorrhea. Ask your health care provider if  you are at risk. Ask your health care provider about whether you are at high risk for HIV. Your health care provider may recommend a prescription medicine to help prevent HIV infection. If you choose to take medicine to prevent HIV, you should first get tested for HIV. You should then be tested every 3 months for as long as you  are taking the medicine. Osteoporosis and menopause Osteoporosis is a disease in which the bones lose minerals and strength with aging. This can result in bone fractures. If you are 44 years old or older, or if you are at risk for osteoporosis and fractures, ask your health care provider if you should: Be screened for bone loss. Take a calcium or vitamin D supplement to lower your risk of fractures. Be given hormone replacement therapy (HRT) to treat symptoms of menopause. Follow these instructions at home: Alcohol use Do not drink alcohol if: Your health care provider tells you not to drink. You are pregnant, may be pregnant, or are planning to become pregnant. If you drink alcohol: Limit how much you have to: 0-1 drink a day. Know how much alcohol is in your drink. In the [place] one drink equals one 12 oz bottle of beer (355 mL), one 5 oz glass of wine (148 mL), or one 1 oz glass of hard liquor (44 mL). Lifestyle Do not use any products that contain nicotine or tobacco. These products include cigarettes, chewing tobacco, and vaping devices, such as e-cigarettes. If you need help quitting, ask your health care provider. Do not use street drugs. Do not share needles. Ask your health care provider for help if you need support or information about quitting drugs. General instructions Schedule regular health, dental, and eye exams. Stay current with your vaccines. Tell your health care provider if: You often feel depressed. You have ever been abused or do not feel safe at home. Summary Adopting a healthy lifestyle and getting preventive care are important in promoting health and wellness. Follow your health care provider's instructions about healthy diet, exercising, and getting tested or screened for diseases. Follow your health care provider's instructions on monitoring your cholesterol and blood pressure. This information is not intended to replace advice given to you by your health  care provider. Make sure you discuss any questions you have with your health care provider. Document Revised: 10/28/2020 Document Reviewed: 10/28/2020 Elsevier Patient Education  2024 [employer].

## 2023-05-12 ENCOUNTER — Encounter: Payer: Self-pay | Admitting: Obstetrics and Gynecology

## 2023-07-12 DIAGNOSIS — K602 Anal fissure, unspecified: Secondary | ICD-10-CM | POA: Insufficient documentation

## 2023-07-29 ENCOUNTER — Ambulatory Visit
Admission: EM | Admit: 2023-07-29 | Discharge: 2023-07-29 | Disposition: A | Discharge status: Home / Self Care | Payer: BC Managed Care – PPO

## 2023-07-29 ENCOUNTER — Emergency Department (HOSPITAL_COMMUNITY)
Admission: EM | Admit: 2023-07-29 | Discharge: 2023-07-29 | Disposition: A | Discharge status: Home / Self Care | Payer: BC Managed Care – PPO | Attending: Emergency Medicine | Admitting: Emergency Medicine

## 2023-07-29 ENCOUNTER — Encounter (HOSPITAL_COMMUNITY): Payer: Self-pay | Admitting: Emergency Medicine

## 2023-07-29 ENCOUNTER — Emergency Department (HOSPITAL_COMMUNITY): Payer: BC Managed Care – PPO

## 2023-07-29 DIAGNOSIS — R109 Unspecified abdominal pain: Secondary | ICD-10-CM | POA: Diagnosis present

## 2023-07-29 DIAGNOSIS — K2900 Acute gastritis without bleeding: Secondary | ICD-10-CM | POA: Insufficient documentation

## 2023-07-29 DIAGNOSIS — K5732 Diverticulitis of large intestine without perforation or abscess without bleeding: Secondary | ICD-10-CM

## 2023-07-29 LAB — COMPREHENSIVE METABOLIC PANEL
ALT: 21 U/L (ref 0–44)
AST: 19 U/L (ref 15–41)
Albumin: 4.8 g/dL (ref 3.5–5.0)
Alkaline Phosphatase: 54 U/L (ref 38–126)
Anion gap: 10 (ref 5–15)
BUN: 15 mg/dL (ref 6–20)
CO2: 28 mmol/L (ref 22–32)
Calcium: 10.1 mg/dL (ref 8.9–10.3)
Chloride: 101 mmol/L (ref 98–111)
Creatinine, Ser: 0.55 mg/dL (ref 0.44–1.00)
GFR, Estimated: >60 mL/min (ref >60)
Glucose, Bld: 106 mg/dL — ABNORMAL HIGH (ref 70–99)
Potassium: 3.7 mmol/L (ref 3.5–5.1)
Sodium: 139 mmol/L (ref 135–145)
Total Bilirubin: 0.6 mg/dL (ref 0.0–1.2)
Total Protein: 8.7 g/dL — ABNORMAL HIGH (ref 6.5–8.1)

## 2023-07-29 LAB — CBC
HCT: 41.6 % (ref 36.0–46.0)
Hemoglobin: 14.1 g/dL (ref 12.0–15.0)
MCH: 31.1 pg (ref 26.0–34.0)
MCHC: 33.9 g/dL (ref 30.0–36.0)
MCV: 91.8 fL (ref 80.0–100.0)
Platelets: 215 10*3/uL (ref 150–400)
RBC: 4.53 MIL/uL (ref 3.87–5.11)
RDW: 13 % (ref 11.5–15.5)
WBC: 5.1 10*3/uL (ref 4.0–10.5)
nRBC: 0 % (ref 0.0–0.2)

## 2023-07-29 LAB — LIPASE, BLOOD: Lipase: 39 U/L (ref 11–51)

## 2023-07-29 MED ORDER — PANTOPRAZOLE SODIUM 40 MG PO TBEC: 40.0000 mg | DELAYED_RELEASE_TABLET | Freq: Two times a day (BID) | ORAL | 0 refills | Status: DC

## 2023-07-29 MED ORDER — IOHEXOL 300 MG/ML  SOLN
100.0000 mL | Freq: Once | INTRAMUSCULAR | Status: AC | PRN
  Administered 2023-07-29: 100 mL via INTRAVENOUS

## 2023-07-29 MED ORDER — MAALOX MAX 400-400-40 MG/5ML PO SUSP: 15.0000 mL | Freq: Four times a day (QID) | ORAL | 0 refills | Status: AC | PRN

## 2023-07-29 NOTE — ED Provider Notes (Signed)
  EMERGENCY DEPARTMENT AT [facility] Provider Note   CSN: [id number] Arrival date & time: 07/29/23  1310     History Chief Complaint  Patient presents with   Abdominal Pain    HPI Kristie Hill is a 53 y.o. female presenting for chief complaint of abdominal pain.  Been intermittent in nature.  Seen 2 weeks ago by PCP diagnosed with diverticulitis.  -Abdominal pain x2 weeks -Diagnosed with diverticulitis and finished the antibiotics -Hx of PUD Patient's recorded medical, surgical, social, medication list and allergies were reviewed in the Snapshot window as part of the initial history.   Review of Systems   Review of Systems  Constitutional:  Negative for chills and fever.  HENT:  Negative for congestion, ear pain and sore throat.   Eyes:  Negative for pain and visual disturbance.  Respiratory:  Negative for cough, choking and shortness of breath.   Cardiovascular:  Negative for chest pain and palpitations.  Gastrointestinal:  Positive for abdominal pain and nausea. Negative for vomiting.  Genitourinary:  Negative for dysuria and hematuria.  Musculoskeletal:  Negative for arthralgias and back pain.  Skin:  Negative for color change and rash.  Neurological:  Negative for seizures and syncope.  All other systems reviewed and are negative.   Physical Exam Updated Vital Signs BP (!) 128/91 (BP Location: Right Arm)   Pulse 91   Temp 99.6 F (37.6 C) (Oral)   Resp 16   LMP 07/15/2017   SpO2 100%  Physical Exam Vitals and nursing note reviewed.  Constitutional:      General: She is not in acute distress.    Appearance: She is well-developed.  HENT:     Head: Normocephalic and atraumatic.  Eyes:     Conjunctiva/sclera: Conjunctivae normal.  Cardiovascular:     Rate and Rhythm: Normal rate and regular rhythm.     Heart sounds: No murmur heard. Pulmonary:     Effort: Pulmonary effort is normal. No respiratory distress.     Breath sounds:  Normal breath sounds.  Abdominal:     General: There is no distension.     Palpations: Abdomen is soft.     Tenderness: There is no abdominal tenderness. There is no right CVA tenderness or left CVA tenderness.  Musculoskeletal:        General: No swelling or tenderness. Normal range of motion.     Cervical back: Neck supple.  Skin:    General: Skin is warm and dry.  Neurological:     General: No focal deficit present.     Mental Status: She is alert and oriented to person, place, and time. Mental status is at baseline.     Cranial Nerves: No cranial nerve deficit.      ED Course/ Medical Decision Making/ A&P    Procedures Procedures   Medications Ordered in ED Medications  iohexol  (OMNIPAQUE ) 300 MG/ML solution 100 mL (100 mLs Intravenous Contrast Given 07/29/23 1746)   Medical Decision Making:   Kristie Hill is a 53 y.o. female who presented to the ED today with abdominal pain, detailed above.    Additional history discussed with patient's family/caregivers.  Patient placed on continuous vitals and telemetry monitoring while in ED which was reviewed periodically.  Complete initial physical exam performed, notably the patient  was HDS in NAD.     Reviewed and confirmed nursing documentation for past medical history, family history, social history.    Initial Assessment:   With the patient's presentation of  abdominal pain, most likely diagnosis is nonspecific etiology. Other diagnoses were considered including (but not limited to) gastroenteritis, colitis, small bowel obstruction, appendicitis, cholecystitis, pancreatitis, nephrolithiasis, UTI, pyleonephritis. These are considered less likely due to history of present illness and physical exam findings.   This is most consistent with an acute life/limb threatening illness complicated by underlying chronic conditions.   Initial Plan:  CBC/CMP to evaluate for underlying infectious/metabolic etiology for patient's  abdominal pain  Lipase to evaluate for pancreatitis  CTAB/Pelvis with contrast to evaluate for structural/surgical etiology of patients' severe abdominal pain.  Urinalysis and repeat physical assessment to evaluate for UTI/Pyelonpehritis  Empiric management of symptoms with escalating pain control and antiemetics as needed.   Initial Study Results:   Laboratory  All laboratory results reviewed without evidence of clinically relevant pathology.    Radiology All images reviewed independently. Agree with radiology report at this time.   CT ABDOMEN PELVIS W CONTRAST Result Date: 07/29/2023 CLINICAL DATA:  Left lower quadrant pain EXAM: CT ABDOMEN AND PELVIS WITH CONTRAST TECHNIQUE: Multidetector CT imaging of the abdomen and pelvis was performed using the standard protocol following bolus administration of intravenous contrast. RADIATION DOSE REDUCTION: This exam was performed according to the departmental dose-optimization program which includes automated exposure control, adjustment of the mA and/or kV according to patient size and/or use of iterative reconstruction technique. CONTRAST:  OMNIPAQUE  IOHEXOL  300 MG/ML  SOLN COMPARISON:  None Available. FINDINGS: Lower chest: Lung bases demonstrate mild tree-in-bud ground-glass nodularity at the right base and right middle lobe. Hepatobiliary: No focal liver abnormality is seen. No gallstones, gallbladder wall thickening, or biliary dilatation. Pancreas: Unremarkable. No pancreatic ductal dilatation or surrounding inflammatory changes. Spleen: Normal in size without focal abnormality. Adrenals/Urinary Tract: Adrenal glands are unremarkable. Kidneys are normal, without renal calculi, focal lesion, or hydronephrosis. Bladder is unremarkable. Stomach/Bowel: Stomach is within normal limits. Appendix appears normal. No evidence of bowel wall thickening, distention, or inflammatory changes. Vascular/Lymphatic: No significant vascular findings are present. No  enlarged abdominal or pelvic lymph nodes. Reproductive: Uterus and bilateral adnexa are unremarkable. Other: No abdominal wall hernia or abnormality. No abdominopelvic ascites. Musculoskeletal: No acute or significant osseous findings. IMPRESSION: 1. No CT evidence for acute intra-abdominal or pelvic abnormality. 2. Mild tree-in-bud ground-glass nodularity at the right base and right middle lobe, likely due to infectious or inflammatory bronchiolitis. Electronically Signed   By: Luke Bun M.D.   On: 07/29/2023 18:20    Final Reassessment and Plan:   My evaluation, patient is overall very well-appearing. She has had an 8-hour wait prior to my evaluation and symptoms have remained resolved during this time.SABRA Her CT scan shows no acute intra-abdominal pathology.  However chart review reveals a history of peptic ulcer disease.  She has only been taking 20 mg Protonix  and states that she has been relatively intermittent even with using this medication.  Discussed importance of medication adherence and she expressed understanding.  Will also double the dose of her Protonix  until she gets in with gastroenterology and discussed supportive care and avoidance of potential triggers. Will add on Maalox for support and recommended close follow-up with PCP.  Disposition:  I have considered need for hospitalization, however, considering all of the above, I believe this patient is stable for discharge at this time.  Patient/family educated about specific return precautions for given chief complaint and symptoms.  Patient/family educated about follow-up with PCP.   Patient/family expressed understanding of return precautions and need for follow-up. Patient spoken to  regarding all imaging and laboratory results and appropriate follow up for these results. All education provided in verbal form with additional information in written form. Time was allowed for answering of patient questions. Patient discharged.     Emergency Department Medication Summary:   Medications  iohexol  (OMNIPAQUE ) 300 MG/ML solution 100 mL (100 mLs Intravenous Contrast Given 07/29/23 1746)            Clinical Impression:  1. Acute gastritis without hemorrhage, unspecified gastritis type      Discharge   Final Clinical Impression(s) / ED Diagnoses Final diagnoses:  Acute gastritis without hemorrhage, unspecified gastritis type    Rx / DC Orders ED Discharge Orders          Ordered    alum & mag hydroxide-simeth (MAALOX MAX) 400-400-40 MG/5ML suspension  Every 6 hours PRN        07/29/23 2109    pantoprazole  (PROTONIX ) 40 MG tablet  2 times daily        07/29/23 2109              Jerral Meth, MD 07/29/23 2118

## 2023-07-29 NOTE — ED Triage Notes (Signed)
 Pt sent here  from UC for abd pain  has finished up antibiotics but has not got better

## 2023-07-29 NOTE — ED Triage Notes (Signed)
 Due to language barrier, an interpreter was present during the history-taking and subsequent discussion (and for part of the physical exam) with this patient. Kristie Hill. Number: [id number]   I am here because 2 wks ago I had a scan done and told I have Diverticulitis. I had antibiotic treatment but don't feel like I have improved.

## 2023-07-29 NOTE — ED Provider Notes (Signed)
 Patient presents today for evaluation of continued abdominal pain after being diagnosed with diverticulitis a few weeks ago. She reports symptoms have not improved despite treatment. Recommended further evaluation in the ED for CT scan. Patient expresses understanding- will transport via POV.    Billy Asberry FALCON, PA-C 07/29/23 1220

## 2023-07-29 NOTE — ED Notes (Signed)
 Patient is being discharged from the Urgent Care and sent to the Emergency Department via Private Vehicle (Self) . Per Provider, patient is in need of higher level of care due to Imaging-Treatment Needed. Patient is aware and verbalizes understanding of plan of care.  Vitals:   07/29/23 1217  BP: 129/86  Pulse: (!) 108  Resp: 18  Temp: 98.6 F (37 C)  SpO2: 99%

## 2023-07-29 NOTE — ED Provider Triage Note (Signed)
 Emergency Medicine Provider Triage Evaluation Note  Kristie Hill , a 53 y.o. female  was evaluated in triage.  Pt complains of left-sided abdominal pain that seems to radiate into her back that seems to be upper sometimes lower and also epigastric.  She states that she was diagnosed with diverticulitis had a CT scan done at [facility] and has a disc with her with that information.  She states she was prescribed an antibiotic and felt significant improvement however then developed worsening abdominal pain went to urgent care and was told to come here for evaluation.  No history of kidney stones she is Spanish-speaking has a daughter who can translate for her on the phone.  Review of Systems  Positive: Abdominal pain Negative: Nausea vomiting fever, dysuria  Physical Exam  BP (!) 153/97 (BP Location: Right Arm)   Pulse 100   Temp (!) 97.5 F (36.4 C) (Oral)   Resp 18   LMP 07/15/2017   SpO2 100%  Gen:   Awake, no distress   Resp:  Normal effort. MSK:   Moves extremities without difficulty. Other:  Abdominal tenderness in left lower quadrant, left periumbilical and left upper quadrant of abdomen.  No CVA tenderness but is indicating left CVA region as uncomfortable.  Medical Decision Making  Medically screening exam initiated at 3:28 PM.  Appropriate orders placed.  Kristie Hill was informed that the remainder of the evaluation will be completed by another provider, this initial triage assessment does not replace that evaluation, and the importance of remaining in the ED until their evaluation is complete.  CT abdomen pelvis, basic labs   Neldon Hamp RAMAN, [place] 07/29/23 1530

## 2023-08-19 NOTE — Progress Notes (Unsigned)
 Chief Complaint:abdominal pain Primary GI Doctor: Dr. Leone Payor  HPI: Patient is a 53 year old female who saw Dr. Leone Payor January 2019 for evaluation of constipation, anal fissure, and transient RUQ pain which wasn't present at that time but had been in the Fall. Her liver tests were normal in Jan 2019.  Ultrasound Nov 2018 showed a 1 cm hemangioma on the liver, no gallstones (reviewed in Care Everywhere).   08/30/2018 patient last seen in the GI office by Gunnar Fusi, NP for chronic intermediate post- prandial right upper quadrant pain along with bloating, nausea, and unintentional weight loss of 11 pounds in 1 year.  Patient also complained of mild burning in throat and globus sensation.  EGD scheduled and patient started on Pepcid 20 mg in the morning.  09/02/2018 EGD Impression:  - Non- bleeding erosive gastropathy. Biopsied  - CLO test  - The examination was otherwise normal. Results: Testing negative for H pylori infection  Dr. Leone Payor sent in Pantoprazole for 2 mths to treat gastritis  On 07/29/2023 patient went to [facility] long ED with complaints of abdominal pain.  Seen 2 weeks ago by PCP and diagnosed with diverticulitis.  CT scan shows no acute intra-abdominal pathology.  Labs revealed normal lipase 39, normal LFTs, normal kidney function, WBC 5.1. With patient's history of peptic ulcer disease they instructed her to double the dose of her Protonix and take 20 mg twice daily and referral to GI.  Interval History  Patient presents today with main complaint of  Patient admits/denies GERD Patient taking Protonix 20 mg twice daily? Patient admits/denies dysphagia Patient admits/denies nausea, vomiting, or weight loss  Patient admits/denies altered bowel habits Patient admits/denies abdominal pain Patient admits/denies rectal bleeding   Denies/Admits alcohol Denies/Admits smoking Denies/Admits NSAID use. Denies/Admits they are on blood thinners.  Patients last colonoscopy Patients  last EGD  Patient's family history includes  Wt Readings from Last 3 Encounters:  07/29/23 188 lb 15 oz (85.7 kg)  05/05/23 189 lb (85.7 kg)  05/01/22 189 lb (85.7 kg)      Past Medical History:  Diagnosis Date   Anal fissure    Anxiety    Blood type, Rh negative    O negative   Cataract    Classical migraine with intractable migraine 10/21/2016   Hemorrhoids    Hormone disorder 2012   hyperprolactinemia   Hypertension    Oligomenorrhea    Recurrent pregnancy loss     Past Surgical History:  Procedure Laterality Date   DILATION AND CURETTAGE OF UTERUS  01/07/07   DILATION AND CURETTAGE OF UTERUS  10/01/10   D&E   DILATION AND CURETTAGE OF UTERUS     LAPAROSCOPY  04/19/2012   Procedure: LAPAROSCOPY OPERATIVE;  Surgeon: Ok Edwards, MD;  Location: [facility];  Service: Gynecology;  Laterality: Right;  Excision of right para-tubal mass. Aspiration of Left ovarian cyst, pelvic washings     Current Outpatient Medications  Medication Sig Dispense Refill   alum & mag hydroxide-simeth (MAALOX MAX) 400-400-40 MG/5ML suspension Take 15 mLs by mouth every 6 (six) hours as needed for indigestion. 355 mL 0   amLODipine (NORVASC) 5 MG tablet Take 5 mg by mouth daily.     dicyclomine (BENTYL) 20 MG tablet Take 20 mg by mouth 3 (three) times daily before meals.     hydrochlorothiazide (HYDRODIURIL) 12.5 MG tablet Take 12.5 mg by mouth daily.     hydrochlorothiazide (MICROZIDE) 12.5 MG capsule Take 1 capsule (12.5 mg total) by mouth daily. 30  capsule 0   pantoprazole (PROTONIX) 20 MG tablet Take 2 tablets by mouth daily.     pantoprazole (PROTONIX) 40 MG tablet Take 1 tablet (40 mg total) by mouth 2 (two) times daily. 60 tablet 0   No current facility-administered medications for this visit.    Allergies as of 08/20/2023   (No Known Allergies)    Family History  Problem Relation Age of Onset   Heart disease Father    Hypertension Father    Brain cancer Paternal Grandmother     Lung cancer Paternal Grandfather    Hypertension Mother    Colon cancer Neg Hx    Stomach cancer Neg Hx    Esophageal cancer Neg Hx    Rectal cancer Neg Hx     Review of Systems:    Constitutional: No weight loss, fever, chills, weakness or fatigue HEENT: Eyes: No change in vision               Ears, Nose, Throat:  No change in hearing or congestion Skin: No rash or itching Cardiovascular: No chest pain, chest pressure or palpitations   Respiratory: No SOB or cough Gastrointestinal: See HPI and otherwise negative Genitourinary: No dysuria or change in urinary frequency Neurological: No headache, dizziness or syncope Musculoskeletal: No new muscle or joint pain Hematologic: No bleeding or bruising Psychiatric: No history of depression or anxiety    Physical Exam:  Vital signs: LMP 07/15/2017   Constitutional:   Pleasant Caucasian female*** appears to be in NAD, Well developed, Well nourished, alert and cooperative Head:  Normocephalic and atraumatic. Eyes:   PEERL, EOMI. No icterus. Conjunctiva pink. Ears:  Normal auditory acuity. Neck:  Supple Throat: Oral cavity and pharynx without inflammation, swelling or lesion.  Respiratory: Respirations even and unlabored. Lungs clear to auscultation bilaterally.   No wheezes, crackles, or rhonchi.  Cardiovascular: Normal S1, S2. Regular rate and rhythm. No peripheral edema, cyanosis or pallor.  Gastrointestinal:  Soft, nondistended, nontender. No rebound or guarding. Normal bowel sounds. No appreciable masses or hepatomegaly. Rectal:  Not performed.  Anoscopy: Msk:  Symmetrical without gross deformities. Without edema, no deformity or joint abnormality.  Neurologic:  Alert and  oriented x4;  grossly normal neurologically.  Skin:   Dry and intact without significant lesions or rashes. Psychiatric: Oriented to person, place and time. Demonstrates good judgement and reason without abnormal affect or behaviors.  RELEVANT LABS AND  IMAGING: CBC    Latest Ref Rng & Units 07/29/2023    4:17 PM 07/07/2017   10:53 AM 02/27/2016    9:30 AM  CBC  WBC 4.0 - 10.5 K/uL 5.1  8.5  7.1   Hemoglobin 12.0 - 15.0 g/dL 16.1  09.6  04.5   Hematocrit 36.0 - 46.0 % 41.6  37.5  39.3   Platelets 150 - 400 K/uL 215  270  300      CMP     Latest Ref Rng & Units 07/29/2023    4:17 PM 07/07/2017   10:53 AM 02/27/2016    9:30 AM  CMP  Glucose 70 - 99 mg/dL 409  90  98   BUN 6 - 20 mg/dL 15  11  13    Creatinine 0.44 - 1.00 mg/dL 8.11  9.14  7.82   Sodium 135 - 145 mmol/L 139  140  136   Potassium 3.5 - 5.1 mmol/L 3.7  4.1  4.3   Chloride 98 - 111 mmol/L 101  108  103   CO2 22 -  32 mmol/L 28  23  26    Calcium 8.9 - 10.3 mg/dL 11.9  9.5  9.4   Total Protein 6.5 - 8.1 g/dL 8.7  7.0  7.5   Total Bilirubin 0.0 - 1.2 mg/dL 0.6  0.4  0.3   Alkaline Phos 38 - 126 U/L 54   47   AST 15 - 41 U/L 19  18  15    ALT 0 - 44 U/L 21  22  13       Lab Results  Component Value Date   TSH 1.67 04/14/2018  07/29/2023 CT abdomen pelvis with contrast IMPRESSION: 1. No CT evidence for acute intra-abdominal or pelvic abnormality. 2. Mild tree-in-bud ground-glass nodularity at the right base and right middle lobe, likely due to infectious or inflammatory bronchiolitis.  Assessment: 1. ***  Plan: - due for colonoscopy   Thank you for the courtesy of this consult. Please call me with any questions or concerns.   Latroy Gaymon, FNP-C [facility] 08/19/2023, 9:03 AM  Cc: Hinojosa-Clapp, Marcela*

## 2023-08-20 ENCOUNTER — Ambulatory Visit: Payer: BC Managed Care – PPO | Admitting: Gastroenterology

## 2023-08-20 ENCOUNTER — Encounter: Payer: Self-pay | Admitting: Gastroenterology

## 2023-08-20 VITALS — BP 104/72 | HR 96 | Ht 63.0 in | Wt 190.5 lb

## 2023-08-20 DIAGNOSIS — R1031 Right lower quadrant pain: Secondary | ICD-10-CM

## 2023-08-20 DIAGNOSIS — R194 Change in bowel habit: Secondary | ICD-10-CM

## 2023-08-20 DIAGNOSIS — R1032 Left lower quadrant pain: Secondary | ICD-10-CM

## 2023-08-20 DIAGNOSIS — Z1211 Encounter for screening for malignant neoplasm of colon: Secondary | ICD-10-CM

## 2023-08-20 DIAGNOSIS — Z8711 Personal history of peptic ulcer disease: Secondary | ICD-10-CM | POA: Diagnosis not present

## 2023-08-20 MED ORDER — SUFLAVE 178.7 G PO SOLR: 1.0000 | Freq: Once | ORAL | 0 refills | Status: AC

## 2023-08-20 NOTE — Patient Instructions (Addendum)
 Continue Pantoprazole 40mg  twice daily  GERD diet, no late meals No NSAIDs. Avoid fatty greasy foods.   You have been scheduled for an endoscopy and colonoscopy. Please follow the written instructions given to you at your visit today.  If you use inhalers (even only as needed), please bring them with you on the day of your procedure.  DO NOT TAKE 7 DAYS PRIOR TO TEST- Trulicity (dulaglutide) Ozempic, Wegovy (semaglutide) Mounjaro (tirzepatide) Bydureon Bcise (exanatide extended release)  DO NOT TAKE 1 DAY PRIOR TO YOUR TEST Rybelsus (semaglutide) Adlyxin (lixisenatide) Victoza (liraglutide) Byetta (exanatide)  Thank you for trusting me with your gastrointestinal care!   Deanna May, NP  ___________________________________________________________________________  Bonita Quin will receive your bowel preparation through [facility], which ensures the lowest copay and home delivery, with outreach via text or call from an [phone] number. Please respond promptly to avoid rescheduling of your procedure. If you are interested in alternative options or have any questions regarding your prep, please contact them at [phone] ____________________________________________________________________________  Your Provider Has Sent Your Bowel Prep Regimen To [facility]   [facility] will contact you to verify your information and collect your copay, if applicable. Enjoy the comfort of your home while your prescription is mailed to you, FREE of any shipping charges.   [facility] accepts all major insurance benefits and applies discounts & coupons.  Have additional questions?   Chat: [email or web address] Call: [phone] Email: [email or web address] NCPDP: [id number]  How will [facility] contact you?  With a Welcome phone call,  a Welcome text and a checkout link in text form.  Texts you receive from [phone] Are NOT Spam.  *To set up delivery, you must complete the checkout process  via link or speak to one of the patient care representatives. If [facility] is unable to reach you, your prescription may be delayed.  To avoid long hold times on the phone, you may also utilize the secure chat feature on the [facility] website to request that they call you back for transaction completion or to expedite your concerns.   _______________________________________________________  If your blood pressure at your visit was 140/90 or greater, please contact your primary care physician to follow up on this.  _______________________________________________________  If you are age 62 or older, your body mass index should be between 23-30. Your Body mass index is 33.75 kg/m. If this is out of the aforementioned range listed, please consider follow up with your Primary Care Provider.  If you are age 47 or younger, your body mass index should be between 19-25. Your Body mass index is 33.75 kg/m. If this is out of the aformentioned range listed, please consider follow up with your Primary Care Provider.   ________________________________________________________  The [facility] providers would like to encourage you to use [facility] to communicate with providers for non-urgent requests or questions.  Due to long hold times on the telephone, sending your provider a message by [facility] may be a faster and more efficient way to get a response.  Please allow 48 business hours for a response.  Please remember that this is for non-urgent requests.  _______________________________________________________

## 2023-09-07 ENCOUNTER — Ambulatory Visit (AMBULATORY_SURGERY_CENTER): Payer: BC Managed Care – PPO | Admitting: Internal Medicine

## 2023-09-07 ENCOUNTER — Encounter: Payer: Self-pay | Admitting: Internal Medicine

## 2023-09-07 VITALS — BP 104/71 | HR 72 | Temp 97.7°F | Resp 13 | Ht 63.0 in | Wt 190.0 lb

## 2023-09-07 DIAGNOSIS — Z1211 Encounter for screening for malignant neoplasm of colon: Secondary | ICD-10-CM

## 2023-09-07 DIAGNOSIS — Z8711 Personal history of peptic ulcer disease: Secondary | ICD-10-CM | POA: Diagnosis not present

## 2023-09-07 DIAGNOSIS — R1032 Left lower quadrant pain: Secondary | ICD-10-CM

## 2023-09-07 DIAGNOSIS — R1031 Right lower quadrant pain: Secondary | ICD-10-CM

## 2023-09-07 DIAGNOSIS — K2289 Other specified disease of esophagus: Secondary | ICD-10-CM | POA: Diagnosis not present

## 2023-09-07 DIAGNOSIS — K2100 Gastro-esophageal reflux disease with esophagitis, without bleeding: Secondary | ICD-10-CM

## 2023-09-07 MED ORDER — SODIUM CHLORIDE 0.9 % IV SOLN: 500.0000 mL | Freq: Once | INTRAVENOUS | Status: AC

## 2023-09-07 MED ORDER — PANTOPRAZOLE SODIUM 40 MG PO TBEC: 40.0000 mg | DELAYED_RELEASE_TABLET | Freq: Every day | ORAL | 3 refills | Status: DC

## 2023-09-07 NOTE — Progress Notes (Signed)
 Pt's states no medical or surgical changes since previsit or office visit.

## 2023-09-07 NOTE — Op Note (Signed)
 [facility] Patient Name: Kristie Hill Procedure Date: 09/07/2023 7:54 AM MRN: [id number] Endoscopist: Iva Boop , MD, [id number] Age: 53 Referring MD:  Date of Birth: March 14, 1971 Gender: Female Account #: [id number] Procedure:                Colonoscopy Indications:              Screening for colorectal malignant neoplasm, This                            is the patient's first colonoscopy Medicines:                Monitored Anesthesia Care Procedure:                Pre-Anesthesia Assessment:                           - Prior to the procedure, a History and Physical                            was performed, and patient medications and                            allergies were reviewed. The patient's tolerance of                            previous anesthesia was also reviewed. The risks                            and benefits of the procedure and the sedation                            options and risks were discussed with the patient.                            All questions were answered, and informed consent                            was obtained. Prior Anticoagulants: The patient has                            taken no anticoagulant or antiplatelet agents. ASA                            Grade Assessment: II - A patient with mild systemic                            disease. After reviewing the risks and benefits,                            the patient was deemed in satisfactory condition to                            undergo the procedure.  After obtaining informed consent, the colonoscope                            was passed under direct vision. Throughout the                            procedure, the patient's blood pressure, pulse, and                            oxygen saturations were monitored continuously. The                            PCF-HQ190L Colonoscope [id number] was introduced                            through the anus  and advanced to the the cecum,                            identified by appendiceal orifice and ileocecal                            valve. The colonoscopy was performed without                            difficulty. The patient tolerated the procedure                            well. The quality of the bowel preparation was                            good. The ileocecal valve, appendiceal orifice, and                            rectum were photographed. The bowel preparation                            used was SUFLAVE via split dose instruction. Scope In: 8:22:07 AM Scope Out: 8:34:33 AM Scope Withdrawal Time: 0 hours 10 minutes 5 seconds  Total Procedure Duration: 0 hours 12 minutes 26 seconds  Findings:                 The perianal and digital rectal examinations were                            normal.                           The entire examined colon appeared normal on direct                            and retroflexion views. Complications:            No immediate complications. Estimated Blood Loss:     Estimated blood loss: none. Impression:               - The entire examined  colon is normal on direct and                            retroflexion views.                           - No specimens collected. Recommendation:           - Patient has a contact number available for                            emergencies. The signs and symptoms of potential                            delayed complications were discussed with the                            patient. Return to normal activities tomorrow.                            Written discharge instructions were provided to the                            patient.                           - Repeat colonoscopy in 10 years for screening                            purposes. Iva Boop, MD 09/07/2023 8:50:36 AM This report has been signed electronically.

## 2023-09-07 NOTE — Patient Instructions (Addendum)
 I did not find any major problems. Slight irritation where the esophagus meets the stomach (some acid reflux changes) and otherwise normal upper endoscopy. Colonoscopy was normal.  Continue pantoprazole daily.  Next routine colonoscopy or other screening test in 10 years - 2035.   I appreciate the opportunity to care for you. Iva Boop, MD, FACG  YOU HAD AN ENDOSCOPIC PROCEDURE TODAY AT THE [facility]:   Refer to the procedure report that was given to you for any specific questions about what was found during the examination.  If the procedure report does not answer your questions, please call your gastroenterologist to clarify.  If you requested that your care partner not be given the details of your procedure findings, then the procedure report has been included in a sealed envelope for you to review at your convenience later.  YOU SHOULD EXPECT: Some feelings of bloating in the abdomen. Passage of more gas than usual.  Walking can help get rid of the air that was put into your GI tract during the procedure and reduce the bloating. If you had a lower endoscopy (such as a colonoscopy or flexible sigmoidoscopy) you may notice spotting of blood in your stool or on the toilet paper. If you underwent a bowel prep for your procedure, you may not have a normal bowel movement for a few days.  Please Note:  You might notice some irritation and congestion in your nose or some drainage.  This is from the oxygen used during your procedure.  There is no need for concern and it should clear up in a day or so.  SYMPTOMS TO REPORT IMMEDIATELY:  Following lower endoscopy (colonoscopy or flexible sigmoidoscopy):  Excessive amounts of blood in the stool  Significant tenderness or worsening of abdominal pains  Swelling of the abdomen that is new, acute  Fever of 100F or higher  Following upper endoscopy (EGD)  Vomiting of blood or coffee ground material  New chest pain or pain under the  shoulder blades  Painful or persistently difficult swallowing  New shortness of breath  Fever of 100F or higher  Black, tarry-looking stools  For urgent or emergent issues, a gastroenterologist can be reached at any hour by calling [phone]. Do not use MyChart messaging for urgent concerns.    DIET:  Follow a GERD prevention diet (see handout given to you by your recovery nurse). We do recommend a small meal at first, but then you may proceed to your regular diet.  Drink plenty of fluids but you should avoid alcoholic beverages for 24 hours.  MEDICATIONS: Continue present medications. Use Pantoprazole 40 mg once daily.  Please see handouts given to you by your recovery nurse: GERD diet, esophagitis.  FOLLOW UP: Repeat colonoscopy in 10 years for screening purposes.  Thank you for allowing [place] to provide for your healthcare needs today.  ACTIVITY:  You should plan to take it easy for the rest of today and you should NOT DRIVE or use heavy machinery until tomorrow (because of the sedation medicines used during the test).    FOLLOW UP: Our staff will call the number listed on your records the next business day following your procedure.  We will call around 7:15- 8:00 am to check on you and address any questions or concerns that you may have regarding the information given to you following your procedure. If we do not reach you, we will leave a message.     If any biopsies were taken  you will be contacted by phone or by letter within the next 1-3 weeks.  Please call us at [phone] if you have not heard about the biopsies in 3 weeks.    SIGNATURES/CONFIDENTIALITY: You and/or your care partner have signed paperwork which will be entered into your electronic medical record.  These signatures attest to the fact that that the information above on your After Visit Summary has been reviewed and is understood.  Full responsibility of the confidentiality of this discharge information lies  with you and/or your care-partner.  USTED TUVO UN PROCEDIMIENTO ENDOSCPICO HOY EN EL [facility]:   Lea el informe del procedimiento que se le entreg para cualquier pregunta especfica sobre lo que se Dentist.  Si el informe del examen no responde a sus preguntas, por favor llame a su gastroenterlogo para aclararlo.  Si usted solicit que no se le den [employer] de lo que se Clinical cytogeneticist en su procedimiento al [employer] va a cuidar, entonces el informe del procedimiento se ha incluido en un sobre sellado para que usted lo revise despus cuando le sea ms conveniente.   LO QUE PUEDE ESPERAR: Algunas sensaciones de hinchazn en el abdomen.  Puede tener ms gases de lo normal.  El caminar puede ayudarle a eliminar el aire que se le puso en el tracto gastrointestinal durante el procedimiento y reducir la hinchazn.  Si le hicieron una endoscopia inferior (como una colonoscopia o una sigmoidoscopia flexible), podra notar manchas de sangre en las heces fecales o en el papel higinico.  Si se someti a una preparacin intestinal para su procedimiento, es posible que no tenga una evacuacin intestinal normal durante [employer].   Tenga en cuenta:  Es posible que note un poco de irritacin y congestin en la nariz o algn drenaje.  Esto es debido al oxgeno [employer] durante su procedimiento.  No hay que preocuparse y esto debe desaparecer ms o Regulatory affairs officer.   SNTOMAS PARA REPORTAR INMEDIATAMENTE:  Despus de una endoscopia inferior (colonoscopia o sigmoidoscopia flexible):  Cantidades excesivas de sangre en las heces fecales  Sensibilidad significativa o empeoramiento de los dolores abdominales   Hinchazn aguda del abdomen que antes no tena   Fiebre de 100F o ms   Despus de la endoscopia superior (EGD)  Vmitos de Retail buyer o material como caf molido   Dolor en el pecho o dolor debajo de los omplatos que antes no tena   Dolor o dificultad persistente para  tragar  Falta de aire que antes no tena   Fiebre de 100F o ms  Heces fecales negras y pegajosas   Para asuntos urgentes o de Associate Professor, puede comunicarse con un gastroenterlogo a cualquier hora llamando al [phone].  DIETA:  Recomendamos una comida pequea al principio, pero luego puede continuar con su dieta normal.  Tome muchos lquidos, Tax adviser las bebidas alcohlicas durante 24 horas.    ACTIVIDAD:  Debe planear tomarse las cosas con calma por el resto del da y no debe CONDUCIR ni usar maquinaria pesada Patent examiner (debido a los medicamentos de sedacin utilizados durante el examen).     SEGUIMIENTO: Nuestro personal llamar al nmero que aparece en su historial al siguiente da hbil de su procedimiento para ver cmo se siente y para responder cualquier pregunta o inquietud que pueda tener con respecto a la informacin que se le dio despus del procedimiento. Si no podemos contactarle, le dejaremos un mensaje.  Sin embargo, si  se siente bien y no tiene ningn problema, no es necesario que nos devuelva la llamada.  Asumiremos que ha regresado a sus actividades diarias normales sin incidentes. Si se le tomaron algunas biopsias, le contactaremos por telfono o por carta en las prximas 3 semanas.  Si no ha sabido [employer] biopsias en el transcurso de 3 semanas, por favor llmenos al [phone].   FIRMAS/CONFIDENCIALIDAD: Usted y/o el acompaante que le cuide han firmado documentos que se ingresarn en su historial mdico electrnico.  Estas firmas atestiguan el hecho de que la informacin anterior

## 2023-09-07 NOTE — Progress Notes (Signed)
 Drowsy, VSS, resps reg and even. Report to RN

## 2023-09-07 NOTE — Op Note (Signed)
 [facility] Patient Name: Kristie Hill Procedure Date: 09/07/2023 7:55 AM MRN: [id number] Endoscopist: Iva Boop , MD, [id number] Age: 53 Referring MD:  Date of Birth: 1970-12-12 Gender: Female Account #: [id number] Procedure:                Upper GI endoscopy Indications:              Lower abdominal pain that improved with PPI, hx PUD Medicines:                Monitored Anesthesia Care Procedure:                Pre-Anesthesia Assessment:                           - Prior to the procedure, a History and Physical                            was performed, and patient medications and                            allergies were reviewed. The patient's tolerance of                            previous anesthesia was also reviewed. The risks                            and benefits of the procedure and the sedation                            options and risks were discussed with the patient.                            All questions were answered, and informed consent                            was obtained. Prior Anticoagulants: The patient has                            taken no anticoagulant or antiplatelet agents. ASA                            Grade Assessment: II - A patient with mild systemic                            disease. After reviewing the risks and benefits,                            the patient was deemed in satisfactory condition to                            undergo the procedure.                           After obtaining informed consent, the endoscope was  passed under direct vision. Throughout the                            procedure, the patient's blood pressure, pulse, and                            oxygen saturations were monitored continuously. The                            GIF HQ190 #[id number] was introduced through the                            mouth, and advanced to the second part of duodenum.                             The upper GI endoscopy was accomplished without                            difficulty. The patient tolerated the procedure                            well. Scope In: Scope Out: Findings:                 LA Grade A (one or more mucosal breaks less than 5                            mm, not extending between tops of 2 mucosal folds)                            esophagitis with no bleeding was found at the                            gastroesophageal junction.                           The Z-line was irregular and was found at the                            gastroesophageal junction.                           The exam was otherwise without abnormality.                           The cardia and gastric fundus were normal on                            retroflexion. Complications:            No immediate complications. Estimated Blood Loss:     Estimated blood loss: none. Impression:               - LA Grade A reflux esophagitis with no bleeding.                            (  very slight changes of esophagitis)                           - Z-line irregular, at the gastroesophageal                            junction.                           - The examination was otherwise normal.                           - No specimens collected. Recommendation:           - Patient has a contact number available for                            emergencies. The signs and symptoms of potential                            delayed complications were discussed with the                            patient. Return to normal activities tomorrow.                            Written discharge instructions were provided to the                            patient.                           - GERD prevention diet.                           - Continue present medications. Pantoprazole 40mg                             daily                           - See the other procedure note for documentation of                             additional recommendations. Iva Boop, MD 09/07/2023 8:45:55 AM This report has been signed electronically.

## 2023-09-07 NOTE — Progress Notes (Signed)
 History and Physical Interval Note:  09/07/2023 8:09 AM  Kristie Hill  has presented today for endoscopic procedure(s), with the diagnosis of  Encounter Diagnoses  Name Primary?   LLQ abdominal pain Yes   History of peptic ulcer disease    Altered bowel habits    RLQ abdominal pain   .  The various methods of evaluation and treatment have been discussed with the patient and/or family. After consideration of risks, benefits and other options for treatment, the patient has consented to  the endoscopic procedure(s).   The patient's history has been reviewed, patient examined, no change in status, stable for endoscopic procedure(s).  I have reviewed the patient's chart and labs.  Questions were answered to the patient's satisfaction.     Iva Boop, MD, Clementeen Graham

## 2023-09-07 NOTE — Progress Notes (Signed)
 Interpreter used today at the [facility] for this pt.  Interpreter's name is- Interior and spatial designer

## 2023-09-08 ENCOUNTER — Telehealth: Payer: Self-pay

## 2023-09-08 NOTE — Telephone Encounter (Signed)
  Follow up Call-     09/07/2023    7:16 AM  Call back number  Post procedure Call Back phone  # [phone]-speaks some english  Permission to leave phone message Yes     Patient questions:  Do you have a fever, pain , or abdominal swelling? No. Pain Score  0 *  Have you tolerated food without any problems? Yes.    Have you been able to return to your normal activities? Yes.    Do you have any questions about your discharge instructions: Diet   No. Medications  No. Follow up visit  No.  Do you have questions or concerns about your Care? No.  Actions: * If pain score is 4 or above: No action needed, pain <4.

## 2023-10-11 ENCOUNTER — Telehealth: Payer: Self-pay | Admitting: Internal Medicine

## 2023-10-11 MED ORDER — PANTOPRAZOLE SODIUM 40 MG PO TBEC: 40.0000 mg | DELAYED_RELEASE_TABLET | Freq: Every day | ORAL | 2 refills | Status: AC

## 2023-10-11 NOTE — Telephone Encounter (Signed)
 Pantoprazole  refilled as requested , up to date on office visits.

## 2023-10-11 NOTE — Telephone Encounter (Signed)
 Inbound call from patient, would like refill of pantoprazole  sent to [facility] pharmacy.

## 2024-05-16 LAB — HM MAMMOGRAPHY

## 2024-05-17 ENCOUNTER — Encounter: Payer: Self-pay | Admitting: Obstetrics and Gynecology

## 2024-05-18 ENCOUNTER — Ambulatory Visit: Payer: Self-pay | Admitting: Obstetrics and Gynecology
